# Patient Record
Sex: Female | Born: 1945 | Race: White | Hispanic: No | Marital: Married | Smoking: Never smoker
Health system: Southern US, Community
[De-identification: ages and names within clinical notes are randomized; demographics above are authoritative.]

## PROBLEM LIST (undated history)

## (undated) DIAGNOSIS — Z98811 Dental restoration status: Secondary | ICD-10-CM

## (undated) DIAGNOSIS — I10 Essential (primary) hypertension: Secondary | ICD-10-CM

## (undated) DIAGNOSIS — R42 Dizziness and giddiness: Secondary | ICD-10-CM

## (undated) DIAGNOSIS — H409 Unspecified glaucoma: Secondary | ICD-10-CM

## (undated) DIAGNOSIS — J302 Other seasonal allergic rhinitis: Secondary | ICD-10-CM

## (undated) DIAGNOSIS — R002 Palpitations: Secondary | ICD-10-CM

## (undated) DIAGNOSIS — M65841 Other synovitis and tenosynovitis, right hand: Secondary | ICD-10-CM

## (undated) DIAGNOSIS — Z972 Presence of dental prosthetic device (complete) (partial): Secondary | ICD-10-CM

## (undated) DIAGNOSIS — G43909 Migraine, unspecified, not intractable, without status migrainosus: Secondary | ICD-10-CM

## (undated) HISTORY — DX: Dizziness and giddiness: R42

## (undated) HISTORY — PX: TUBAL LIGATION: SHX77

## (undated) HISTORY — DX: Hypercalcemia: E83.52

## (undated) HISTORY — PX: BREAST BIOPSY: SHX20

## (undated) HISTORY — DX: Palpitations: R00.2

## (undated) HISTORY — PX: CATARACT EXTRACTION W/ INTRAOCULAR LENS IMPLANT: SHX1309

## (undated) HISTORY — DX: Migraine, unspecified, not intractable, without status migrainosus: G43.909

---

## 2001-02-04 ENCOUNTER — Ambulatory Visit (HOSPITAL_COMMUNITY): Admission: RE | Admit: 2001-02-04 | Discharge: 2001-02-04 | Payer: Self-pay | Admitting: Family Medicine

## 2001-02-04 ENCOUNTER — Encounter: Payer: Self-pay | Admitting: Family Medicine

## 2001-04-19 ENCOUNTER — Other Ambulatory Visit: Admission: RE | Admit: 2001-04-19 | Discharge: 2001-04-19 | Payer: Self-pay | Admitting: Family Medicine

## 2001-10-21 ENCOUNTER — Other Ambulatory Visit: Admission: RE | Admit: 2001-10-21 | Discharge: 2001-10-21 | Payer: Self-pay | Admitting: Orthopaedic Surgery

## 2002-02-16 ENCOUNTER — Encounter: Payer: Self-pay | Admitting: Family Medicine

## 2002-02-16 ENCOUNTER — Ambulatory Visit (HOSPITAL_COMMUNITY): Admission: RE | Admit: 2002-02-16 | Discharge: 2002-02-16 | Payer: Self-pay | Admitting: Family Medicine

## 2002-09-29 ENCOUNTER — Other Ambulatory Visit: Admission: RE | Admit: 2002-09-29 | Discharge: 2002-09-29 | Payer: Self-pay | Admitting: Obstetrics & Gynecology

## 2003-03-16 ENCOUNTER — Encounter: Payer: Self-pay | Admitting: Family Medicine

## 2003-03-16 ENCOUNTER — Ambulatory Visit (HOSPITAL_COMMUNITY): Admission: RE | Admit: 2003-03-16 | Discharge: 2003-03-16 | Payer: Self-pay | Admitting: Family Medicine

## 2003-11-21 ENCOUNTER — Other Ambulatory Visit: Admission: RE | Admit: 2003-11-21 | Discharge: 2003-11-21 | Payer: Self-pay | Admitting: Obstetrics & Gynecology

## 2004-04-29 ENCOUNTER — Ambulatory Visit (HOSPITAL_COMMUNITY): Admission: RE | Admit: 2004-04-29 | Discharge: 2004-04-29 | Payer: Self-pay | Admitting: Family Medicine

## 2004-11-28 ENCOUNTER — Other Ambulatory Visit: Admission: RE | Admit: 2004-11-28 | Discharge: 2004-11-28 | Payer: Self-pay | Admitting: Obstetrics and Gynecology

## 2005-05-11 ENCOUNTER — Ambulatory Visit (HOSPITAL_COMMUNITY): Admission: RE | Admit: 2005-05-11 | Discharge: 2005-05-11 | Payer: Self-pay | Admitting: Family Medicine

## 2005-12-04 ENCOUNTER — Other Ambulatory Visit: Admission: RE | Admit: 2005-12-04 | Discharge: 2005-12-04 | Payer: Self-pay | Admitting: Obstetrics & Gynecology

## 2006-06-04 ENCOUNTER — Encounter: Admission: RE | Admit: 2006-06-04 | Discharge: 2006-06-04 | Payer: Self-pay | Admitting: Family Medicine

## 2006-10-22 ENCOUNTER — Ambulatory Visit (HOSPITAL_COMMUNITY): Admission: RE | Admit: 2006-10-22 | Discharge: 2006-10-22 | Payer: Self-pay | Admitting: Family Medicine

## 2006-11-19 ENCOUNTER — Ambulatory Visit (HOSPITAL_COMMUNITY): Admission: RE | Admit: 2006-11-19 | Discharge: 2006-11-19 | Payer: Self-pay | Admitting: Family Medicine

## 2007-12-15 ENCOUNTER — Encounter: Admission: RE | Admit: 2007-12-15 | Discharge: 2007-12-15 | Payer: Self-pay | Admitting: Family Medicine

## 2007-12-22 ENCOUNTER — Encounter: Admission: RE | Admit: 2007-12-22 | Discharge: 2007-12-22 | Payer: Self-pay | Admitting: Family Medicine

## 2009-01-10 ENCOUNTER — Encounter: Admission: RE | Admit: 2009-01-10 | Discharge: 2009-01-10 | Payer: Self-pay | Admitting: Family Medicine

## 2010-02-07 ENCOUNTER — Encounter
Admission: RE | Admit: 2010-02-07 | Discharge: 2010-02-07 | Payer: Self-pay | Source: Home / Self Care | Admitting: Family Medicine

## 2010-10-23 ENCOUNTER — Ambulatory Visit: Admit: 2010-10-23 | Payer: Self-pay | Admitting: Internal Medicine

## 2010-10-23 ENCOUNTER — Ambulatory Visit (HOSPITAL_COMMUNITY)
Admission: RE | Admit: 2010-10-23 | Discharge: 2010-10-23 | Payer: Self-pay | Source: Home / Self Care | Attending: Internal Medicine | Admitting: Internal Medicine

## 2010-11-02 ENCOUNTER — Encounter: Payer: Self-pay | Admitting: Family Medicine

## 2010-11-10 NOTE — Op Note (Signed)
  NAMEJESSALYNN, Sandra Henry             ACCOUNT NO.:  [id number]  MEDICAL RECORD NO.:  [id number]          PATIENT TYPE:  AMB  LOCATION:  DAY                           FACILITY:  [facility]  PHYSICIAN:  Lionel December, M.D.    DATE OF BIRTH:  June 18, 1946  DATE OF PROCEDURE:  10/23/2010 DATE OF DISCHARGE:                              OPERATIVE REPORT   PROCEDURE:  Colonoscopy.  INDICATION:  [name] is 65 year old Caucasian female who is here for screening exam.  Her last exam was 11 years and was normal.  She has noted some change in her bowel habits.  She said she has had intermittent diarrhea, but no abdominal pain, rectal bleeding. Procedure risks were reviewed with the patient.  Informed consent was obtained.  MEDS FOR CONSCIOUS SEDATION: 1. Demerol 50 mg IV. 2. Versed 3 mg IV.  FINDINGS:  Procedure performed in endoscopy suite.  The patient's vital signs and O2 sat were monitored during the procedure and remained stable.  The patient was placed in left lateral recumbent position and rectal examination performed.  No abnormality noted on external or digital exam.  Pentax videoscope was placed in rectum and advanced under vision into sigmoid colon and beyond.  Preparation was excellent.  Scope was passed into cecum which was identified by ileocecal valve and appendiceal orifice.  Short segment of GI was also examined and was normal.  As the scope was withdrawn, colonic mucosa was carefully examined and was normal throughout.  Random biopsies were taken from mucosa was sigmoid colon looking for collagenous and microscopic colitis.  Rectal mucosa was normal.  Scope was retroflexed to examine anorectal junction and small hemorrhoids noted below the dentate line. Endoscope was then withdrawn.  Withdrawal time was 13 minutes.  The patient tolerated the procedure well.  FINAL DIAGNOSES: 1. Normal terminal ileum. 2. Normal colonoscopy except small external hemorrhoids. 3. Random biopsies  taken from mucosa of sigmoid colon looking for     collagenous/microscopic colitis.  RECOMMENDATIONS: 1. Standard instructions given.  I asked [name] to discontinue Activia. 2. I will be contacting her with results of biopsy and further     recommendations.     Lionel December, M.D.     NR/MEDQ  D:  10/23/2010  T:  10/23/2010  Job:  [id number]  cc:   Mila Homer. Sudie Bailey, M.D. Fax: [phone]  Electronically Signed by Lionel December M.D. on 11/10/2010 07:18:31 AM

## 2011-01-23 ENCOUNTER — Other Ambulatory Visit: Payer: Self-pay | Admitting: Family Medicine

## 2011-01-23 DIAGNOSIS — Z1231 Encounter for screening mammogram for malignant neoplasm of breast: Secondary | ICD-10-CM

## 2011-02-13 ENCOUNTER — Other Ambulatory Visit: Payer: Self-pay | Admitting: Obstetrics & Gynecology

## 2011-02-13 ENCOUNTER — Ambulatory Visit
Admission: RE | Admit: 2011-02-13 | Discharge: 2011-02-13 | Disposition: A | Discharge status: Home / Self Care | Payer: Medicare Other | Source: Ambulatory Visit | Attending: Family Medicine | Admitting: Family Medicine

## 2011-02-13 DIAGNOSIS — Z1231 Encounter for screening mammogram for malignant neoplasm of breast: Secondary | ICD-10-CM

## 2011-08-20 ENCOUNTER — Ambulatory Visit (HOSPITAL_COMMUNITY)
Admission: RE | Admit: 2011-08-20 | Discharge: 2011-08-20 | Disposition: A | Discharge status: Home / Self Care | Payer: Medicare Other | Source: Ambulatory Visit | Attending: Family Medicine | Admitting: Family Medicine

## 2011-08-20 ENCOUNTER — Other Ambulatory Visit (HOSPITAL_COMMUNITY): Payer: Self-pay | Admitting: Family Medicine

## 2011-08-20 DIAGNOSIS — M5030 Other cervical disc degeneration, unspecified cervical region: Secondary | ICD-10-CM | POA: Insufficient documentation

## 2011-08-20 DIAGNOSIS — M542 Cervicalgia: Secondary | ICD-10-CM

## 2011-09-01 ENCOUNTER — Other Ambulatory Visit (HOSPITAL_COMMUNITY): Payer: Self-pay | Admitting: Family Medicine

## 2011-09-01 ENCOUNTER — Ambulatory Visit (HOSPITAL_COMMUNITY)
Admission: RE | Admit: 2011-09-01 | Discharge: 2011-09-01 | Disposition: A | Discharge status: Home / Self Care | Payer: Medicare Other | Source: Ambulatory Visit | Attending: Family Medicine | Admitting: Family Medicine

## 2011-09-01 DIAGNOSIS — M542 Cervicalgia: Secondary | ICD-10-CM

## 2011-09-01 DIAGNOSIS — W19XXXA Unspecified fall, initial encounter: Secondary | ICD-10-CM

## 2011-09-01 DIAGNOSIS — M5030 Other cervical disc degeneration, unspecified cervical region: Secondary | ICD-10-CM | POA: Insufficient documentation

## 2011-09-08 ENCOUNTER — Other Ambulatory Visit (HOSPITAL_COMMUNITY): Payer: Self-pay | Admitting: Neurological Surgery

## 2011-09-08 DIAGNOSIS — M479 Spondylosis, unspecified: Secondary | ICD-10-CM

## 2011-09-10 ENCOUNTER — Ambulatory Visit (HOSPITAL_COMMUNITY)
Admission: RE | Admit: 2011-09-10 | Discharge: 2011-09-10 | Disposition: A | Discharge status: Home / Self Care | Payer: Medicare Other | Source: Ambulatory Visit | Attending: Neurological Surgery | Admitting: Neurological Surgery

## 2011-09-10 DIAGNOSIS — M479 Spondylosis, unspecified: Secondary | ICD-10-CM

## 2011-09-10 DIAGNOSIS — M25519 Pain in unspecified shoulder: Secondary | ICD-10-CM | POA: Insufficient documentation

## 2011-09-10 DIAGNOSIS — M542 Cervicalgia: Secondary | ICD-10-CM | POA: Insufficient documentation

## 2011-11-19 ENCOUNTER — Encounter (HOSPITAL_COMMUNITY): Payer: Self-pay

## 2011-11-19 ENCOUNTER — Ambulatory Visit (HOSPITAL_COMMUNITY)
Admission: RE | Admit: 2011-11-19 | Discharge: 2011-11-19 | Disposition: A | Discharge status: Home / Self Care | Payer: Medicare Other | Source: Ambulatory Visit | Attending: Neurological Surgery | Admitting: Neurological Surgery

## 2011-11-19 DIAGNOSIS — H53439 Sector or arcuate defects, unspecified eye: Secondary | ICD-10-CM | POA: Diagnosis not present

## 2011-11-19 DIAGNOSIS — M542 Cervicalgia: Secondary | ICD-10-CM | POA: Diagnosis not present

## 2011-11-19 DIAGNOSIS — H4011X0 Primary open-angle glaucoma, stage unspecified: Secondary | ICD-10-CM | POA: Diagnosis not present

## 2011-11-19 DIAGNOSIS — M6281 Muscle weakness (generalized): Secondary | ICD-10-CM | POA: Insufficient documentation

## 2011-11-19 DIAGNOSIS — IMO0001 Reserved for inherently not codable concepts without codable children: Secondary | ICD-10-CM | POA: Diagnosis not present

## 2011-11-19 DIAGNOSIS — R293 Abnormal posture: Secondary | ICD-10-CM | POA: Insufficient documentation

## 2011-11-19 DIAGNOSIS — H409 Unspecified glaucoma: Secondary | ICD-10-CM | POA: Diagnosis not present

## 2011-11-19 DIAGNOSIS — H2510 Age-related nuclear cataract, unspecified eye: Secondary | ICD-10-CM | POA: Diagnosis not present

## 2011-11-19 NOTE — Evaluation (Signed)
Physical Therapy Evaluation  Patient Details  Name: Sandra Henry MRN: [id number] Date of Birth: Mar 08, 1946  Today's Date: 11/19/2011 Time: 1345-1430 Time Calculation (min): 45 min Charges: 1 EV, 8 TE Visit#: 1  of 16   Re-eval: 12/19/11  Assessment Diagnosis: neck pain Next MD Visit: none scheduled.  Prior Therapy: none  Subjective Symptoms/Limitations Symptoms: 08/19/11 fell and injured her neck.  Headaches daily at work.   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   Pain Assessment Currently in Pain?: Yes Pain Score:   4 Pain Location: Neck Pain Orientation: Left Pain Type: Acute pain Pain Radiating Towards: into left shoulder Pain Onset: More than a month ago Pain Frequency: Constant Pain Relieving Factors: sitting down in the recliner and leaning back, has tried heat and ice.    Effect of Pain on Daily Activities: doesn't interfere with sleep (sleeps on her back)  Assessment RUE Strength Right Shoulder Flexion: 5/5 Right Shoulder ABduction: 5/5 Right Elbow Flexion: 5/5 Right Elbow Extension: 5/5 Gross Grasp: Functional LUE Strength Left Shoulder Flexion: 5/5 Left Shoulder ABduction: 5/5 Left Elbow Flexion: 5/5 Left Elbow Extension: 5/5 Gross Grasp: Functional Cervical AROM Cervical Flexion: WNL (posterior pulling) Cervical Extension: 27 degrees of cervical extension Cervical - Right Side Bend: 25 degrees (sharp central pain returning to midline) Cervical - Left Side Bend: 42 degrees Cervical - Right Rotation: 58 degrees Cervical - Left Rotation: 48 degrees Cervical Strength Cervical Flexion: 4/5 Cervical Extension: 4/5 Cervical - Right Side Bend: 4/5 Cervical - Left  Side Bend: 4-/5 Cervical - Right Rotation: 4-/5 Cervical - Left Rotation: 4/5  Exercise/Treatments Stretches Upper Trapezius Stretch: 2 reps;20 seconds;Limitations Upper Trapezius Stretch Limitations: bil Neck Exercises Neck Retraction: Seated;Limitations Neck Retraction Limitations: 8 reps Scapular Retraction: AROM;Seated;10 reps  Physical Therapy Assessment and Plan Clinical Impression Statement: This 66 y.o. female presents to [facility] OP PT for neck pain that started and never resolved after a fall 08/19/11.  She presents with decreased cervical spine ROM with cervical kyphosis instead of the normal cervical lordosis.  She has forward head and rounded shoulders and significant decrease in cervical extension and rotation.  She has very tight upper traps bil and levator scapulae bil.  She has occational radicular symptoms into her shoulders.  She would benefit from skilled PT to help strengthen and mobilize her cervical spine and help decrase her muscle tension in her neck while providing her with postural exercises and education.   Rehab Potential: Good PT Frequency: Min 2X/week PT Duration: 8 weeks PT Treatment/Interventions: Functional mobility training;Therapeutic activities;Therapeutic exercise;Balance training;Neuromuscular re-education;Patient/family education;Other (comment) (modalities as needed for pain, STM for muscle tension) PT Plan: Continue 2 xs/wk x 8 weeks, review HEP, add UBE backwards only for warm-up, add red t-band exercises, corner stretch, prone chin tucks, isometric extension and sidebend bil.      Goals Home Exercise Program Pt will Perform Home Exercise Program: Independently PT Short Term Goals Time to Complete Short Term Goals: 4 weeks PT Short Term Goal 1: The patient will report that she can make it 6 hours at work before her neck starts to bother her increasing her QOL and tolerance of work activities.   PT Short Term Goal 2: The patient will report an average  daily pain of less than 3/10 to show improved QOL and increased tolerance of functional activities.   PT Short Term Goal 3: The patinet will increase her cervical ROM by 5 degrees throughout to show improved cervical ROM and increased ability to check her blind spot when driving.   PT Long Term Goals Time to Complete Long Term Goals: 8 weeks PT Long Term Goal 1: The patient will report that she is able to make it a whole day at work with minimal neck discomfort.   PT Long Term Goal 2: The patietn will report less than 2/10 neck pain on average daily to show improved QOL and tolerance of functional activities.   Long Term Goal 3: The patient will increase cervical ROM by 10 degrees in areas of deficits to show improved cervical spine mobility.   Long Term Goal 4: The pateint will increase cervical strength to 5/5 to show improved cervical spine stability.   Problem List Patient Active Problem List  Diagnoses  . Cervicalgia  . Posture abnormality  . Muscle weakness (generalized)   PT - End of Session Activity Tolerance: Patient tolerated treatment well General Behavior During Session: [facility] for tasks performed Cognition: [facility] for tasks performed PT Plan of Care PT Home Exercise Plan: see scanned report, chin tucks, scap retractions, and UT stretch bil.   Consulted and Agree with Plan of Care: Patient  Analisse Randle. Jaymari Cromie, PT, DPT  11/19/2011, 8:07 PM  Physician Documentation Your signature is required to indicate approval of the treatment plan as stated above.  Please sign and either send electronically or make a copy of this report for your files and return this physician signed original.   Please mark one 1.__approve of plan  2. ___approve of plan with the following conditions.   ______________________________                                                          _____________________ Physician Signature                                                                                                              Date

## 2011-11-26 ENCOUNTER — Ambulatory Visit (HOSPITAL_COMMUNITY)
Admission: RE | Admit: 2011-11-26 | Discharge: 2011-11-26 | Disposition: A | Discharge status: Home / Self Care | Payer: Medicare Other | Source: Ambulatory Visit | Attending: Family Medicine | Admitting: Family Medicine

## 2011-11-26 ENCOUNTER — Ambulatory Visit (HOSPITAL_COMMUNITY): Payer: Medicare Other

## 2011-11-26 NOTE — Progress Notes (Signed)
Physical Therapy Treatment Patient Details  Name: Sandra Henry MRN: [id number] Date of Birth: April 11, 1946  Today's Date: 11/26/2011 Time: 0802-0854 Time Calculation (min): 52 min Visit#: 2  of 16   Re-eval: 12/18/11 Charges:  therex 35', massage 12'    Subjective: Symptoms/Limitations Symptoms: Pt. reports compliance with HEP; Pt. reports no real pain, just alot of stiffness and inability to rotate head.  Pt. displays very guarded posture with next tilted to the left today. Pain Assessment Currently in Pain?: No/denies   Exercise/Treatments Stretches Upper Trapezius Stretch: 2 reps;20 seconds;Limitations Upper Trapezius Stretch Limitations: HEP Corner Stretch: 3 reps;30 seconds Neck Exercises Neck Retraction: 10 reps Neck Retraction Limitations: HEP Neck Extension: 10 reps;Limitations Neck Extension Limitations: isometric against wall Neck Lateral Flexion - Right: 10 reps;Limitations Neck Lateral Flexion - Right Limitations: isometric Neck Lateral Flexion - Left: 10 reps;Limitations Neck Lateral Flexion - Left Limitations: isometric TBAND POSTURAL 3  with RED tband 10 reps each Additional Neck Exercises UBE (Upper Arm Bike): 4' backward  Manual Therapy Manual Therapy: Massage Massage:  B upper traps and cervical mm in seated postion X 12'  Physical Therapy Assessment and Plan PT Assessment and Plan Clinical Impression Statement: Pt. with large knots bilateral cervical but most in L trap/cervical area that was reduced with massage but not diminished.  Added tband, corner stretch and isometric exercises with good form .  PT Plan: Continue to progress strength and increase AROM in cervical area.  Add prone chin tucks     Problem List Patient Active Problem List  Diagnoses  . Cervicalgia  . Posture abnormality  . Muscle weakness (generalized)    PT - End of Session Activity Tolerance: Patient tolerated treatment well General Behavior During Session: [facility] for  tasks performed Cognition: [facility] for tasks performed PT Plan of Care PT Home Exercise Plan: Pt. given written HEP for corner stretch and isometrics today.  Emaleigh Guimond B. Bascom Levels, PTA 11/26/2011, 11:03 AM

## 2011-11-27 ENCOUNTER — Ambulatory Visit (HOSPITAL_COMMUNITY)
Admission: RE | Admit: 2011-11-27 | Discharge: 2011-11-27 | Disposition: A | Discharge status: Home / Self Care | Payer: Medicare Other | Source: Ambulatory Visit

## 2011-11-27 DIAGNOSIS — M542 Cervicalgia: Secondary | ICD-10-CM

## 2011-11-27 DIAGNOSIS — R293 Abnormal posture: Secondary | ICD-10-CM

## 2011-11-27 DIAGNOSIS — M6281 Muscle weakness (generalized): Secondary | ICD-10-CM

## 2011-11-27 NOTE — Progress Notes (Signed)
Physical Therapy Treatment Patient Details  Name: Sandra Henry MRN: [id number] Date of Birth: 10-Apr-1946  Today's Date: 11/27/2011 Time: 4098-1191 Time Calculation (min): 56 min Visit#: 3  of 16   Re-eval: 12/18/11  Charge: therex 40 min Massage 12 min  Subjective: Symptoms/Limitations Symptoms: Pt reports interrment sharp shooting pain L neck, pain scale 2-3/10. Pain Assessment Currently in Pain?: Yes Pain Score:   3 Pain Location: Neck Pain Orientation: Left  Objective:   Exercise/Treatments Stretches Upper Trapezius Stretch: 3 reps;30 seconds;Limitations Upper Trapezius Stretch Limitations: good form with stretch Neck Exercises Neck Retraction: 10 reps;Limitations Neck Retraction Limitations: prone chin tuck head lift Neck Extension: 10 reps;Seated Neck Lateral Flexion - Right: 10 reps;Limitations;AROM Neck Lateral Flexion - Right Limitations: isometric Neck Lateral Flexion - Left: 10 reps;Limitations;AROM Neck Lateral Flexion - Left Limitations: isometric Shoulder Extension: 10 reps;Standing;Theraband Theraband Level (Shoulder Extension): Level 2 (Red) Row: 10 reps;Standing;Theraband Theraband Level (Row): Level 2 (Red) Scapular Retraction: 10 reps;Standing;Theraband Theraband Level (Scapular Retraction): Level 2 (Red) Additional Neck Exercises UBE (Upper Arm Bike): 4' backward  Manual Therapy Manual Therapy: Massage Massage: B upper traps and cervical mm in seated postion X 12'   Physical Therapy Assessment and Plan PT Assessment and Plan Clinical Impression Statement: Began prone chin tuck/head lift with good form/technique complete following demonstration and explaination of purpose of exercise.  Pt with large knots L upper tap and cervical area able to reduce but not fully resolve, pt stated pain reduced at end of session. PT Plan: Continue to progress strength and increase AROM in cervical area.  Resume corner stretch next session.    Goals     Problem List Patient Active Problem List  Diagnoses  . Cervicalgia  . Posture abnormality  . Muscle weakness (generalized)    PT - End of Session Activity Tolerance: Patient tolerated treatment well General Behavior During Session: [facility] for tasks performed Cognition: [facility] for tasks performed  Juel Burrow 11/27/2011, 2:22 PM

## 2011-12-03 ENCOUNTER — Ambulatory Visit (HOSPITAL_COMMUNITY)
Admission: RE | Admit: 2011-12-03 | Discharge: 2011-12-03 | Disposition: A | Discharge status: Home / Self Care | Payer: Medicare Other | Source: Ambulatory Visit | Attending: Family Medicine | Admitting: Family Medicine

## 2011-12-03 NOTE — Progress Notes (Signed)
Physical Therapy Treatment Patient Details  Name: Sandra Henry MRN: [id number] Date of Birth: 1946-05-24  Today's Date: 12/03/2011 Time: 0802-0851 Time Calculation (min): 49 min Visit#: 4  of 16   Re-eval: 12/18/11 Charges:  Therex 32', massage 12'    Subjective: Symptoms/Limitations Symptoms: Pt. reports her choir family can tell her posture has improved; states they are adjusting her computer at work.  More soreness today than pain. Pain Assessment Currently in Pain?: No/denies Pain Score: 0-No pain   Exercise/Treatments Stretches Corner Stretch: 3 reps;30 seconds Neck Exercises Neck Extension: 10 reps;Seated Neck Lateral Flexion - Right: 10 reps;Limitations;AROM Neck Lateral Flexion - Right Limitations: isometric Neck Lateral Flexion - Left: 10 reps;Limitations;AROM Neck Lateral Flexion - Left Limitations: isometric Shoulder Extension: 10 reps Theraband Level (Shoulder Extension): Level 3 (Green) Row: 10 reps Theraband Level (Row): Level 3 (Green) Scapular Retraction: 10 reps Theraband Level (Scapular Retraction): Level 3 (Green) Additional Neck Exercises UBE (Upper Arm Bike): 4' backward  Manual Therapy Manual Therapy: Massage Massage: B upper traps/cervical mm in seated position X 12'  Physical Therapy Assessment and Plan PT Assessment and Plan Clinical Impression Statement: Overall improving posture and chest mm tightness.  Pt with decreased palpable mm spasms/tightness in cervical/scapular area, however continues to have large mm spasms B upper traps that decrease with massage but not totally resolved.  pt. given tband/written instructions for HEP PT Plan: Continue current POC.     Problem List Patient Active Problem List  Diagnoses  . Cervicalgia  . Posture abnormality  . Muscle weakness (generalized)    PT - End of Session Activity Tolerance: Patient tolerated treatment well General Behavior During Session: [facility] for tasks performed Cognition:  [facility] for tasks performed PT Plan of Care PT Home Exercise Plan: Pt. given written instructions/tband today for HEP  GP No functional reporting required  Loveta Dellis B. Bascom Levels, PTA 12/03/2011, 10:45 AM

## 2011-12-04 ENCOUNTER — Ambulatory Visit (HOSPITAL_COMMUNITY)
Admission: RE | Admit: 2011-12-04 | Discharge: 2011-12-04 | Disposition: A | Discharge status: Home / Self Care | Payer: Medicare Other | Source: Ambulatory Visit | Attending: Family Medicine | Admitting: Family Medicine

## 2011-12-04 DIAGNOSIS — M6281 Muscle weakness (generalized): Secondary | ICD-10-CM

## 2011-12-04 DIAGNOSIS — M542 Cervicalgia: Secondary | ICD-10-CM

## 2011-12-04 DIAGNOSIS — R293 Abnormal posture: Secondary | ICD-10-CM

## 2011-12-04 NOTE — Progress Notes (Signed)
Physical Therapy Treatment Patient Details  Name: ROSALEAH PERSON MRN: [id number] Date of Birth: 1946-05-08  Today's Date: 12/04/2011 Time: 1300-1350 Time Calculation (min): 50 min Charges: manual 35', TE: 15' Visit#: 5  of 16   Re-eval: 12/18/11    Subjective: Symptoms/Limitations Symptoms: Pt reports that she is very stiff today.  She reports she is doing better with her range of motion, however continues to have stiffness.  OBJ: Pt comes in and is very timid to move her neck and has significant forward head posture.  Pain Assessment Currently in Pain?: Yes Pain Score:   7 Pain Location: Neck  Exercise/Treatments Neck Exercises Neck Retraction: 5 reps;Supine;Limitations Neck Retraction Limitations: 5 sec hold with chin lift and min A Neck Lateral Flexion - Right: 10 reps;Supine Neck Lateral Flexion - Left: 10 reps;Supine Neck Rotation - Right: 10 reps;Supine Neck Rotation - Left: 10 reps;Supine Upper Extremity Lift: Limitations Uppder Extremity Lift Limitations: Motor Learning II (neck rotation with oppisiste UE rotation.  Additional Neck Exercises UBE (Upper Arm Bike): 4' backward  Manual Therapy Manual Therapy: Other (comment) Massage: Manual therapy with ROM techniques in Supine concentrated on R UT and scapular and cervical spasms.  SCS to decrease muscular spasm to R UT. x35'  Other Manual Therapy: Instruction for diaphragmatic breathing and shoulder, neck and facial awareness in supine.   Physical Therapy Assessment and Plan PT Assessment and Plan Clinical Impression Statement: Today's treatment focus on appropriate technique in supine to decrease amount of spams and promote appropriate cervical activiation.  Pt with considerable decrease in neck pain after treatment completed in supine today.  Treatment:1. UBE, 2.Manual in Supine, 3. Instruction on Diaphragmatic breathing and self awarness 4. Ther-ex.  Pt had improved posture after today's treatment with decreased  UT activiation.  PT Plan: Cont to address pain, address pain after today's treatment.  Continue to discuss relaxation techniques and strengthen when spams are decreased.     Goals    Problem List Patient Active Problem List  Diagnoses  . Cervicalgia  . Posture abnormality  . Muscle weakness (generalized)    PT Plan of Care PT Home Exercise Plan: updated.  See scanned report  Teddy Pena 12/04/2011, 3:41 PM

## 2011-12-07 ENCOUNTER — Ambulatory Visit (HOSPITAL_COMMUNITY)
Admission: RE | Admit: 2011-12-07 | Discharge: 2011-12-07 | Disposition: A | Discharge status: Home / Self Care | Payer: Medicare Other | Source: Ambulatory Visit | Attending: Physical Therapy | Admitting: Physical Therapy

## 2011-12-07 DIAGNOSIS — R293 Abnormal posture: Secondary | ICD-10-CM

## 2011-12-07 DIAGNOSIS — M6281 Muscle weakness (generalized): Secondary | ICD-10-CM

## 2011-12-07 DIAGNOSIS — M542 Cervicalgia: Secondary | ICD-10-CM

## 2011-12-07 NOTE — Progress Notes (Signed)
Physical Therapy Treatment Patient Details  Name: Sandra Henry MRN: [id number] Date of Birth: 10/07/1946  Today's Date: 12/07/2011 Time: 5621-3086 Time Calculation (min): 44 min Charges: 36' TE, 8' Man Visit#: 6  of 16   Re-eval: 12/18/11    Subjective: Symptoms/Limitations Symptoms: Pt reports that she is doing okay if she could just get rid of the spasms in her neck.  She had some relief after last visit. Pain Assessment Currently in Pain?: Yes Pain Score:   3 Pain Location: Neck  Exercise/Treatments Neck Exercises Neck Retraction: 10 reps Neck Retraction Limitations: 5 sec hold w/head lift in supine Neck Flexion: 10 reps;Supine;Limitations Neck Flexion Limitations: 5 sec Neck Extension: 10 reps;Seated Neck Rotation - Right: Limitations;20 reps;Seated;Standing Neck Rotation - Right Limitations: Tactile cueing for approrpriate muscle activiation Neck Rotation - Left: 20 reps;Seated;Standing;Limitations Neck Rotation - Left Limitations: Tactile cueing for appropriate muscle activiation Uppder Extremity Lift Limitations: Motor Learning III (with opp elbow bend) x10 each direction, Upper Extremity Lift with towel roll behind head and alternating arms to 90 degrees flexion x10 each arm. Additional Neck Exercises UBE (Upper Arm Bike): 4' backward  Manual Therapy Manual Therapy: Other (comment) Other Manual Therapy: STM to Bil UT region to decreases mild spams throughout. x8 minutes before ther-ex.   Physical Therapy Assessment and Plan PT Assessment and Plan Clinical Impression Statement: Pt has significant improvement in cervical strength and is able to demonstrate supine ther-ex without assistance.  She is able to maintain appropriate posture with decreased shoulder elevation.  Had decreased muscle soreness and spasm after tactile cueing for appropriate muscule activiation.  PT Plan: Cont to progress cervical stability and mobility with gentle techniques.  Decrease spasm,  then proceed with mobility.      Problem List Patient Active Problem List  Diagnoses  . Cervicalgia  . Posture abnormality  . Muscle weakness (generalized)    Lelynd Poer 12/07/2011, 5:49 PM

## 2011-12-10 ENCOUNTER — Ambulatory Visit (HOSPITAL_COMMUNITY)
Admission: RE | Admit: 2011-12-10 | Discharge: 2011-12-10 | Disposition: A | Discharge status: Home / Self Care | Payer: Medicare Other | Source: Ambulatory Visit | Attending: Family Medicine | Admitting: Family Medicine

## 2011-12-10 DIAGNOSIS — R293 Abnormal posture: Secondary | ICD-10-CM

## 2011-12-10 DIAGNOSIS — M542 Cervicalgia: Secondary | ICD-10-CM

## 2011-12-10 DIAGNOSIS — M6281 Muscle weakness (generalized): Secondary | ICD-10-CM

## 2011-12-10 NOTE — Progress Notes (Signed)
Physical Therapy Treatment Patient Details  Name: Sandra Henry MRN: [id number] Date of Birth: [birth year]  Today's Date: 12/10/2011 Time: 4098-1191 Time Calculation (min): 46 min Visit#: 7  of 16   Re-eval: 12/18/11  Charge: therex 34 min STM 8 min  Subjective: Symptoms/Limitations Symptoms: Feeling good today, last night was not good feel those spasms in the back of neck wont go away.  Do feel like my ROM is improving. Pain Assessment Currently in Pain?: No/denies  Objective:   Exercise/Treatments Neck Exercises Neck Retraction: 10 reps;Supine;Seated Neck Retraction Limitations: 5 sec hold w/head lift in supine Neck Extension: 10 reps;Seated Neck Rotation - Right: 10 reps;Seated;Limitations Neck Rotation - Right Limitations: AAROM with towel for rotation Neck Rotation - Left: 10 reps;Seated;Limitations Neck Rotation - Left Limitations: AAROM with towel for rotation Uppder Extremity Lift Limitations: Motor Learning III (with opp elbow bend) x10 each direction, Upper Extremity Lift with towel roll behind head and alternating arms to 90 degrees flexion x10 each arm. Additional Neck Exercises UBE (Upper Arm Bike): 4' backward  Manual Therapy Other Manual Therapy: STM to Bil UT region to decreases mild spams  Physical Therapy Assessment and Plan PT Assessment and Plan Clinical Impression Statement: Pt with good posture and form with therex exercises, min cueing for technique required through session.  Instructed AA for rotation with towel, pt able to demonstrate with increased rotation and no c/o pain.  Tactile cueing beneficial for UT muscular activation with increased ROM noted..  Able to reduce spasms B UT though unable to fully resolve. PT Plan: Cont to progress cervical stability and mobility with gentle techniques. Decrease spasm, then proceed with mobility.    Goals    Problem List Patient Active Problem List  Diagnoses  . Cervicalgia  . Posture abnormality  .  Muscle weakness (generalized)    PT - End of Session Activity Tolerance: Patient tolerated treatment well General Behavior During Session: [facility] for tasks performed Cognition: [facility] for tasks performed  Juel Burrow, PTA 12/10/2011, 8:55 AM

## 2011-12-15 ENCOUNTER — Ambulatory Visit (HOSPITAL_COMMUNITY)
Admission: RE | Admit: 2011-12-15 | Discharge: 2011-12-15 | Disposition: A | Discharge status: Home / Self Care | Payer: Medicare Other | Source: Ambulatory Visit | Attending: Neurological Surgery | Admitting: Neurological Surgery

## 2011-12-15 DIAGNOSIS — M6281 Muscle weakness (generalized): Secondary | ICD-10-CM | POA: Insufficient documentation

## 2011-12-15 DIAGNOSIS — R293 Abnormal posture: Secondary | ICD-10-CM

## 2011-12-15 DIAGNOSIS — IMO0001 Reserved for inherently not codable concepts without codable children: Secondary | ICD-10-CM | POA: Insufficient documentation

## 2011-12-15 DIAGNOSIS — M542 Cervicalgia: Secondary | ICD-10-CM | POA: Insufficient documentation

## 2011-12-15 NOTE — Progress Notes (Signed)
Physical Therapy Treatment Patient Details  Name: Sandra Henry MRN: [id number] Date of Birth: [birth year]  Today's Date: 12/15/2011 Time: 1720-1810 Time Calculation (min): 50 min Visit#: 8  of 16   Re-eval: 12/18/11  Charge: STM: 18 min therex 28 min  Subjective: Symptoms/Limitations Symptoms: Pt entered dept stating increased pain (6-7/10)/spasms back of neck at work today.  No pain through session just soreness.  Pt stated most difficulty at home is bending over to get clothes out of washer/dryer, getting dishes out of washer and bending down to get objects out of cabinets on floor. Pain Assessment Currently in Pain?: No/denies  Objective:   Exercise/Treatments Neck Exercises Neck Retraction: Supine;10 reps;Limitations Neck Retraction Limitations: 2 sets/ 10 reps5 sec hold w/head lift in supine Neck Extension: 10 reps;Seated;Limitations Neck Extension Limitations: with towel at base of skull to assist with ext Neck Rotation - Right: 10 reps;Seated;Limitations Neck Rotation - Right Limitations: AAROM with towel for rotation Neck Rotation - Left: 10 reps;Seated;Limitations Neck Rotation - Left Limitations: AAROM with towel for rotation Upper Extremity Lift: Limitations Uppder Extremity Lift Limitations: Motor Learning III (with opp elbow bend) x10 each direction, Upper Extremity Lift with towel roll behind head and alternating arms to 90 degrees flexion x10 each arm. Additional Neck Exercises UBE (Upper Arm Bike): 4' backward  Manual Therapy Manual Therapy: Massage Massage: Bilateral UT to reduce spasms x 18 min  Physical Therapy Assessment and Plan PT Assessment and Plan Clinical Impression Statement: Began session with STM due to increased spasms/pain earlier today at work.  Multiple small spasms B UT, able to reduce with relief stated by pt but unable to fully resolve.  Instructed AA with towel, pt able to demonstrate with increase cervical extension ROM noted..   PT  Plan: Re-eval next session.    Goals    Problem List Patient Active Problem List  Diagnoses  . Cervicalgia  . Posture abnormality  . Muscle weakness (generalized)    PT - End of Session Activity Tolerance: Patient tolerated treatment well General Behavior During Session: [facility] for tasks performed Cognition: [facility] for tasks performed  Juel Burrow, PTA 12/15/2011, 6:11 PM

## 2011-12-17 ENCOUNTER — Ambulatory Visit (HOSPITAL_COMMUNITY)
Admission: RE | Admit: 2011-12-17 | Discharge: 2011-12-17 | Disposition: A | Discharge status: Home / Self Care | Payer: Medicare Other | Source: Ambulatory Visit | Attending: Family Medicine | Admitting: Family Medicine

## 2011-12-17 DIAGNOSIS — M6281 Muscle weakness (generalized): Secondary | ICD-10-CM

## 2011-12-17 DIAGNOSIS — R293 Abnormal posture: Secondary | ICD-10-CM

## 2011-12-17 DIAGNOSIS — M542 Cervicalgia: Secondary | ICD-10-CM | POA: Diagnosis not present

## 2011-12-17 DIAGNOSIS — IMO0001 Reserved for inherently not codable concepts without codable children: Secondary | ICD-10-CM | POA: Diagnosis not present

## 2011-12-17 NOTE — Evaluation (Addendum)
Physical Therapy Evaluation  Patient Details  Name: Sandra Henry MRN: [id number] Date of Birth: 09-25-1946  Today's Date: 12/17/2011 Time: 8295-6213 Time Calculation (min): 44 min Charges: 28' Self Care, 1 estim, 1 heat Visit#: 9  of 16   Re-eval: 01/16/12    Past Medical History: No past medical history on file. Past Surgical History: No past surgical history on file.  Subjective Symptoms/Limitations Symptoms: Pt reports that she is making it through the morning with some decrease in pain.  Unable to make it 6 hours through the day without pain.  She feels that she has made progress especially in her ROM and her stretching.  She has less pain overall with exercise.  Overall her pain is about 4-5/10,  however the last two days her pain has been intense to the point that she has had to stop in choir practice. How long can you sit comfortably?: 4 hours  How long can you stand comfortably?: getting groceries is still somewhat untolerable, but it has become a little easier. Pain Assessment Currently in Pain?: Yes Pain Score:   3 Pain Location: Neck  Assessment Cervical AROM Cervical Extension: 30 Cervical - Right Side Bend: 27 Cervical - Left Side Bend: 25 Cervical - Right Rotation: 70 Cervical - Left Rotation: 52 Cervical Strength Cervical Flexion: 5/5 Cervical Extension: 5/5 Cervical - Right Side Bend: 5/5 Cervical - Left Side Bend: 5/5 Cervical - Right Rotation: 5/5 Cervical - Left Rotation: 4/5 Palpation Palpation: Continues to have significant increase in muscle spasms to her posterior cervical spine  Exercise/Treatments  Modalities Modalities: Moist Heat;Electrical Stimulation Moist Heat Therapy Number Minutes Moist Heat: 15 Minutes Moist Heat Location: Shoulder;Other (comment) (Neck) Electrical Stimulation Electrical Stimulation Location: IFES to middle cervical and shoulder region Electrical Stimulation Action: IFES Electrical Stimulation Parameters: 15  minutes  Electrical Stimulation Goals: Pain  Physical Therapy Assessment and Plan PT Assessment and Plan Clinical Impression Statement: Discussed goals and completed Re-eval completed today.  Pt had a decrease in pain after e-stim and heat today.    Sandra Henry has attended 8 OPPT visits and has met 1/4 STG and 0/4 LTG, however is making significant progress towards her STG.  She has improved percieved functional ability, overall decrease in pain and improved cervical and capital flexion strength.  However continues to have impairments including increased stiffness, fascial tightness, muscle spasms and decreased cervical ROM.  She will continue to benefit from skilled OPPT in order to address the above impairments in order to maximize function and reach functional goals.  PT Frequency: Min 2X/week PT Duration: 4 weeks PT Plan: F/U w/estim and heat.  Continue with manual therapy to decrease spasm and exercises to improve cervical stability and flexibility    Goals Home Exercise Program Pt will Perform Home Exercise Program: Independently PT Goal: Perform Home Exercise Program - Progress: Met PT Short Term Goals Time to Complete Short Term Goals: 4 weeks PT Short Term Goal 1: The patient will report that she can make it 6 hours at work before her neck starts to bother her increasing her QOL and tolerance of work activities.   PT Short Term Goal 1 - Progress: Progressing toward goal PT Short Term Goal 2: The patient will report an average daily pain of less than 3/10 to show improved QOL and increased tolerance of functional activities.   PT Short Term Goal 2 - Progress: Progressing toward goal PT Short Term Goal 3: The patinet will increase her cervical ROM by 5 degrees throughout  to show improved cervical ROM and increased ability to check her blind spot when driving.   PT Long Term Goals Time to Complete Long Term Goals: 8 weeks PT Long Term Goal 1: The patient will report that she is able to  make it a whole day at work with minimal neck discomfort.   PT Long Term Goal 1 - Progress: Not met PT Long Term Goal 2: The patietn will report less than 2/10 neck pain on average daily to show improved QOL and tolerance of functional activities.   PT Long Term Goal 2 - Progress: Not met Long Term Goal 3: The patient will increase cervical ROM by 10 degrees in areas of deficits to show improved cervical spine mobility.   Long Term Goal 3 Progress: Not met Long Term Goal 4: The pateint will increase cervical strength to 5/5 to show improved cervical spine stability.  Long Term Goal 4 Progress: Not met  Problem List Patient Active Problem List  Diagnoses  . Cervicalgia  . Posture abnormality  . Muscle weakness (generalized)    PT - End of Session Activity Tolerance: Patient tolerated treatment well  GP  Functional Reporting Modifier  Current Status  [phone] - Changing & Maintaing Body Position CK - At least 40% but less than 60% impaired, limited or restricted  Goal Status  [phone] - Changing & Maintaing Body Position CI - At least 1% but less than 20% impaired, limited or restricted    Based on reports of only being able to sit for 4 hours at a time at work and continues to have difficulty with shopping, doing her hair and pain with eating. Sandra Henry 12/17/2011, 9:52 AM  Physician Documentation Your signature is required to indicate approval of the treatment plan as stated above.  Please sign and either send electronically or make a copy of this report for your files and return this physician signed original.   Please mark one 1.__approve of plan  2. ___approve of plan with the following conditions.   ______________________________                                                          _____________________ Physician Signature                                                                                                             Date

## 2011-12-24 ENCOUNTER — Ambulatory Visit (HOSPITAL_COMMUNITY)
Admission: RE | Admit: 2011-12-24 | Discharge: 2011-12-24 | Disposition: A | Discharge status: Home / Self Care | Payer: Medicare Other | Source: Ambulatory Visit

## 2011-12-24 DIAGNOSIS — M6281 Muscle weakness (generalized): Secondary | ICD-10-CM

## 2011-12-24 DIAGNOSIS — IMO0001 Reserved for inherently not codable concepts without codable children: Secondary | ICD-10-CM | POA: Diagnosis not present

## 2011-12-24 DIAGNOSIS — M542 Cervicalgia: Secondary | ICD-10-CM | POA: Diagnosis not present

## 2011-12-24 DIAGNOSIS — R293 Abnormal posture: Secondary | ICD-10-CM

## 2011-12-24 NOTE — Progress Notes (Signed)
Physical Therapy Treatment Patient Details  Name: Sandra Henry MRN: [id number] Date of Birth: [birth year]  Today's Date: 12/24/2011 Time: 1036-1120 Time Calculation (min): 44 min Charges: NMR x 34', Manual 10'  Visit#: 10  of 16   Re-eval: 01/16/12    Subjective: Symptoms/Limitations Symptoms: Pt states she was doing pretty well until the other day when she started to have an increase in pain in her R SCM.  Has questions reguarding HEP with she has increased muscular pain.  Pain Assessment Currently in Pain?: Yes Pain Score:   3 Pain Location: Neck  Exercise/Treatments Neck Exercises Neck Retraction: 5 reps Neck Retraction Limitations: NMR to establish appropriate chin tuck position with use of biofeedback.  Pt only able to complete 5 appropriate neck retractions in supine after NMR for 5 sec holds.   Manual Therapy Other Manual Therapy: Grade II-III AP joint mobs to C3-C7 (greatest restrcition to C7)  Physical Therapy Assessment and Plan PT Assessment and Plan Clinical Impression Statement: Pt had improve cervical rotation after treatment today which focused on NMR to chin tuck and manual techniques to improve cervical musculature strength and length.  Pt only able to achieve on the biofeedback machine, however was able to disengage inappropriate muscles and use anterior cervical musculature.  PT Plan: Continue to progress to prone on elbows with cervical motions. f/u on joint mobs.     Goals    Problem List Patient Active Problem List  Diagnoses  . Cervicalgia  . Posture abnormality  . Muscle weakness (generalized)       GP No functional reporting required  Sandra Henry 12/24/2011, 2:54 PM

## 2011-12-25 ENCOUNTER — Ambulatory Visit (HOSPITAL_COMMUNITY)
Admission: RE | Admit: 2011-12-25 | Discharge: 2011-12-25 | Disposition: A | Discharge status: Home / Self Care | Payer: Medicare Other | Source: Ambulatory Visit | Attending: Family Medicine | Admitting: Family Medicine

## 2011-12-25 DIAGNOSIS — M6281 Muscle weakness (generalized): Secondary | ICD-10-CM

## 2011-12-25 DIAGNOSIS — M542 Cervicalgia: Secondary | ICD-10-CM | POA: Diagnosis not present

## 2011-12-25 DIAGNOSIS — R293 Abnormal posture: Secondary | ICD-10-CM

## 2011-12-25 DIAGNOSIS — IMO0001 Reserved for inherently not codable concepts without codable children: Secondary | ICD-10-CM | POA: Diagnosis not present

## 2011-12-25 NOTE — Progress Notes (Signed)
Physical Therapy Treatment Patient Details  Name: MOESHA SARCHET MRN: [id number] Date of Birth: January 23, 1946  Today's Date: 12/25/2011 Time: 1351-1430 Time Calculation (min): 39 min Charges: 30' Manual, 9 min NMR Visit#: 11  of 16   Re-eval: 01/16/12    Subjective: Symptoms/Limitations Symptoms: Pt reports she has increased spasms typically in the afternoon. Pain Assessment Currently in Pain?: Yes Pain Score:   3 (w/R SB) Pain Location: Neck  Exercise/Treatments Neck Exercises Neck Retraction: 10 reps Neck Retraction Limitations: NMR initally, able to complete 10x10 sec holds in supine, addeded 5x10 sec hold chin lifts  Manual Therapy Other Manual Therapy: Grade II-III AP joint mobs to C3-C7 . SCS to L UT. reports of pain after is 1/10 w/RSB  Physical Therapy Assessment and Plan PT Assessment and Plan Clinical Impression Statement: Pt reports a decrease in R SB today after manual and stabilization treatment of a 1/10.  She had improved quality of cervical rotation to both sides as well.  Continues to demostrate most difficulty with extension and increased tone to L UT.  PT Plan: Continue to progress to prone on elbows with cervical motions. f/u on joint mobs    Goals    Problem List Patient Active Problem List  Diagnoses  . Cervicalgia  . Posture abnormality  . Muscle weakness (generalized)    Kristol Almanzar 12/25/2011, 2:40 PM

## 2011-12-31 ENCOUNTER — Ambulatory Visit (HOSPITAL_COMMUNITY)
Admission: RE | Admit: 2011-12-31 | Discharge: 2011-12-31 | Disposition: A | Discharge status: Home / Self Care | Payer: Medicare Other | Source: Ambulatory Visit

## 2011-12-31 DIAGNOSIS — IMO0001 Reserved for inherently not codable concepts without codable children: Secondary | ICD-10-CM | POA: Diagnosis not present

## 2011-12-31 DIAGNOSIS — M542 Cervicalgia: Secondary | ICD-10-CM | POA: Diagnosis not present

## 2011-12-31 DIAGNOSIS — R293 Abnormal posture: Secondary | ICD-10-CM

## 2011-12-31 DIAGNOSIS — M6281 Muscle weakness (generalized): Secondary | ICD-10-CM

## 2011-12-31 NOTE — Progress Notes (Signed)
Physical Therapy Treatment Patient Details  Name: Sandra Henry MRN: [id number] Date of Birth: [birth year]  Today's Date: 12/31/2011 Time: 4098-1191 Time Calculation (min): 43 min Charges: 33' Manual, 10' NMR Visit#: 12  of 16   Re-eval: 01/16/12    Subjective: Symptoms/Limitations Symptoms: She states that this week she is starting to really feel a lot better, but reports she is not working as much as usual.  She reports that her Left side no longer has pain, and it is her R side that is the worst.  Pain Assessment Currently in Pain?: Yes Pain Score:   3 (cervical flexion and R rotation to R UT) Pain Location: Neck Pain Orientation: Right  Exercise/Treatments Neck Exercises Neck Flexion: 5 reps;Limitations Neck Flexion Limitations: POE w/manual facilition for correct movement Neck Extension: 5 reps;Limitations Neck Extension Limitations: POE w/manual facilition for correct movement Neck Rotation - Right: 10 reps;AROM Neck Rotation - Right Limitations: POE w/manual facilition for correct movement Neck Rotation - Left: 10 reps;AROM Neck Rotation - Left Limitations: POE w/manual facilition for correct movement  Manual Therapy Manual Therapy: Joint mobilization Joint Mobilization: Grade II-III PA to C2-C7 in supine; AP to C3 with pt in seated position. SCS to R UT/middle scalene region to multiple locations w/STM after.  Released by 80%.   Physical Therapy Assessment and Plan PT Assessment and Plan Clinical Impression Statement: Pt demonstrates ability to independently decrease unwanted muscle activation through the use of diaphragmatic breathing.  She has a notable improvement in rotation (R>L). is now limited at end range of R rot and neck flexion.  After treatment today she had improved quality of movement with rotation and extension and increased neck flexion with reports of 1/10 pain to R transverse process of C3 PT Plan: F/U w/treatment today. Cont with POE activities  including PNF patterns.    Goals    Problem List Patient Active Problem List  Diagnoses  . Cervicalgia  . Posture abnormality  . Muscle weakness (generalized)       GP No functional reporting required  Jermale Crass 12/31/2011, 9:42 AM

## 2011-12-31 NOTE — Patient Instructions (Signed)
C-

## 2012-01-01 ENCOUNTER — Ambulatory Visit (HOSPITAL_COMMUNITY)
Admission: RE | Admit: 2012-01-01 | Discharge: 2012-01-01 | Disposition: A | Discharge status: Home / Self Care | Payer: Medicare Other | Source: Ambulatory Visit | Attending: Family Medicine | Admitting: Family Medicine

## 2012-01-01 DIAGNOSIS — M6281 Muscle weakness (generalized): Secondary | ICD-10-CM

## 2012-01-01 DIAGNOSIS — R293 Abnormal posture: Secondary | ICD-10-CM

## 2012-01-01 DIAGNOSIS — M542 Cervicalgia: Secondary | ICD-10-CM | POA: Diagnosis not present

## 2012-01-01 DIAGNOSIS — IMO0001 Reserved for inherently not codable concepts without codable children: Secondary | ICD-10-CM | POA: Diagnosis not present

## 2012-01-01 NOTE — Progress Notes (Signed)
Physical Therapy Treatment Patient Details  Name: Sandra Henry MRN: [id number] Date of Birth: 11-30-1945  Today's Date: 01/01/2012 Time: 7829-5621 Time Calculation (min): 47 min Charges: 29' Manual; 10' TE, 1 ice Visit#: 13  of 16   Re-eval: 01/16/12    Subjective: Symptoms/Limitations Symptoms: Pt reports that she has been stressed at work today, but has not had time to think about her neck too much.   Pain Assessment Currently in Pain?: Yes Pain Score:   3 (R cervical rotation) Pain Location: Neck Pain Orientation: Right  Exercise/Treatments Neck Exercises Neck Rotation - Right: 10 reps Neck Rotation - Right Limitations: supine and 10 seated Neck Rotation - Left: 10 reps;AROM Neck Rotation - Left Limitations: supine and 10 seated Upper Extremity Lift: 5 reps;Alternating (w/chin tuck Supine) Additional Neck Exercises Wall Pushups/Modified Pushups: Serratus push ups (POE) 10x  Modalities Modalities: Cryotherapy Manual Therapy Manual Therapy: Joint mobilization Joint Mobilization: Grade II-III PA to C2-C7 in supine; AP to C3 with pt in seated position. SCS to R UT/middle scalene region to multiple locations w/STM after.  Improved R rotation and  decreased pain.  Cryotherapy Number Minutes Cryotherapy: 10 Minutes Cryotherapy Location: Neck Type of Cryotherapy: Ice pack  Physical Therapy Assessment and Plan PT Assessment and Plan Clinical Impression Statement: dramatic improvement with her rotation and improved posture (decreased kyphosis).  has pain only at end range of R rotation (which is within 5% of normal).  Added ice today to decrease inflammation to insertion of SCM  PT Plan: Cont with POE activites including PNF when able.     Goals    Problem List Patient Active Problem List  Diagnoses  . Cervicalgia  . Posture abnormality  . Muscle weakness (generalized)       GP No functional reporting required  Lilymarie Scroggins 01/01/2012, 2:00 PM

## 2012-01-07 ENCOUNTER — Ambulatory Visit (HOSPITAL_COMMUNITY)
Admission: RE | Admit: 2012-01-07 | Discharge: 2012-01-07 | Disposition: A | Discharge status: Home / Self Care | Payer: Medicare Other | Source: Ambulatory Visit

## 2012-01-07 DIAGNOSIS — M6281 Muscle weakness (generalized): Secondary | ICD-10-CM

## 2012-01-07 DIAGNOSIS — M542 Cervicalgia: Secondary | ICD-10-CM

## 2012-01-07 DIAGNOSIS — IMO0001 Reserved for inherently not codable concepts without codable children: Secondary | ICD-10-CM | POA: Diagnosis not present

## 2012-01-07 DIAGNOSIS — R293 Abnormal posture: Secondary | ICD-10-CM

## 2012-01-07 NOTE — Progress Notes (Signed)
Physical Therapy Treatment Patient Details  Name: JOHNATHAN TORTORELLI MRN: [id number] Date of Birth: Feb 09, 1946  Today's Date: 01/07/2012 Time: 1305-1400 Time Calculation (min): 55 min Charges: 10' Manual, 30' TE, 15' Estim Visit#: 14  of 16   Re-eval: 01/16/12    Subjective: Symptoms/Limitations Symptoms: I just had a bad spasm yesterday morning when I was on the phone at home and work.  So today I am having a lot more pain today on the right side.  Objective: Signficant spasm to her R cervical region Pain Assessment Currently in Pain?: Yes Pain Score:   2  Exercise/Treatments Neck Exercises Neck Flexion: Limitations Neck Flexion Limitations: 7 reps POE w/manual assistance Neck Extension: Limitations Neck Extension Limitations: 7 reps POE w/manual assistance Neck Lateral Flexion - Right: 5 reps;Seated Neck Lateral Flexion - Left: 5 reps;Seated Neck Rotation - Right: Limitations Neck Rotation - Right Limitations: 7 reps POE w/manual assistance Neck Rotation - Left: Limitations Neck Rotation - Left Limitations: 7 reps POE w/manual assistance; PNF D1 pattern both directions 5x each with manual assistance Upper Extremity Lift: 10 reps;Limitations (Standing w/chin tuck against wall BUE) Uppder Extremity Lift Limitations: Chin Tuck with CW and CCW ball rotations BUE 1 min each Additional Neck Exercises Wall Pushups/Modified Pushups: Serratus push ups (POE) 10x  Manual Therapy Manual Therapy: Joint mobilization Joint Mobilization: Grade II-III PA to C2-T4 in supine; AP to C2-4 with pt in seated position. SCS to R UT/middle scalene region to multiple locations w/STM after. Pharmacologist Location: IFES to middle cervical and shoulder region  Electrical Stimulation Action: IFES Electrical Stimulation Parameters: 15 minutes w/heat Electrical Stimulation Goals: Pain  Physical Therapy Assessment and Plan PT Assessment and Plan Clinical Impression  Statement: continues to have improved AROM in all directions, however continues to have pain during end range of motion.  PT Plan: Continue to progress    Goals    Problem List Patient Active Problem List  Diagnoses  . Cervicalgia  . Posture abnormality  . Muscle weakness (generalized)       GP No functional reporting required  Breezie Micucci 01/07/2012, 3:03 PM

## 2012-01-08 ENCOUNTER — Ambulatory Visit (HOSPITAL_COMMUNITY)
Admission: RE | Admit: 2012-01-08 | Discharge: 2012-01-08 | Disposition: A | Discharge status: Home / Self Care | Payer: Medicare Other | Source: Ambulatory Visit

## 2012-01-08 DIAGNOSIS — M542 Cervicalgia: Secondary | ICD-10-CM | POA: Diagnosis not present

## 2012-01-08 DIAGNOSIS — M6281 Muscle weakness (generalized): Secondary | ICD-10-CM

## 2012-01-08 DIAGNOSIS — IMO0001 Reserved for inherently not codable concepts without codable children: Secondary | ICD-10-CM | POA: Diagnosis not present

## 2012-01-08 DIAGNOSIS — R293 Abnormal posture: Secondary | ICD-10-CM

## 2012-01-08 NOTE — Progress Notes (Signed)
Physical Therapy Treatment Patient Details  Name: Sandra Henry MRN: [id number] Date of Birth: [birth year]  Today's Date: 01/08/2012 Time: 4098-1191 Time Calculation (min): 40 min Charges: 1 e-stim, 15' TE, 15' Manual Visit#: 15  of 16   Re-eval: 01/16/12    Subjective: Symptoms/Limitations Symptoms: "I hate to say it, but I am just so stiff this morning.  I put heat on my neck last night before I went to bed and this morning I woke up very stiff." Pain Assessment Currently in Pain?: Yes Pain Score:   2 Pain Location: Neck  Precautions/Restrictions     Exercise/Treatments Neck Exercises Neck Lateral Flexion - Right: 10 reps Neck Lateral Flexion - Right Limitations: w/scap retraction Neck Lateral Flexion - Left: 10 reps (w/scap retraction) Neck Rotation - Right: Limitations;Other (comment) (w/scap retraction) Neck Rotation - Right Limitations: 12 reps Neck Rotation - Left: Limitations Neck Rotation - Left Limitations: 12 reps w/scap retration  Modalities Modalities: Electrical Stimulation Manual Therapy Manual Therapy: Myofascial release Joint Mobilization: Grade II Joint mobs to R C3-4 Transverse process.   Myofascial Release: in sitting MFR with R rotation to decrease muscular spasms.  Other Manual Therapy: NMR using kinesotape to R UT Electrical Stimulation Electrical Stimulation Location: IFES to middle cervical and shoulder region  Electrical Stimulation Parameters: 10 minutes before treatment Electrical Stimulation Goals: Pain  Physical Therapy Assessment and Plan PT Assessment and Plan Clinical Impression Statement: Today's treatment focus on proper alignment and deactivating R SCM muscle.  She continues to demonstrate increased spasms to her R SCM muscle which has the greatest amount of pain with R rotation.   PT Plan: Re-assess next visit    Goals    Problem List Patient Active Problem List  Diagnoses  . Cervicalgia  . Posture abnormality  . Muscle  weakness (generalized)       GP No functional reporting required  Mehar Sagen 01/08/2012, 1:50 PM

## 2012-01-11 ENCOUNTER — Ambulatory Visit (HOSPITAL_COMMUNITY)
Admission: RE | Admit: 2012-01-11 | Discharge: 2012-01-11 | Disposition: A | Discharge status: Home / Self Care | Payer: Medicare Other | Source: Ambulatory Visit | Attending: Neurological Surgery | Admitting: Neurological Surgery

## 2012-01-11 DIAGNOSIS — M6281 Muscle weakness (generalized): Secondary | ICD-10-CM | POA: Diagnosis not present

## 2012-01-11 DIAGNOSIS — R293 Abnormal posture: Secondary | ICD-10-CM

## 2012-01-11 DIAGNOSIS — M542 Cervicalgia: Secondary | ICD-10-CM | POA: Insufficient documentation

## 2012-01-11 DIAGNOSIS — IMO0001 Reserved for inherently not codable concepts without codable children: Secondary | ICD-10-CM | POA: Diagnosis not present

## 2012-01-11 NOTE — Progress Notes (Signed)
Physical Therapy Treatment Patient Details  Name: Sandra Henry MRN: [id number] Date of Birth: 1945-11-19  Today's Date: 01/11/2012 Time: 4401-0272 Time Calculation (min): 39 min Charges: 30' manual, 9' TE Visit#: 16  of 16   Re-eval: 01/16/12    Subjective: Symptoms/Limitations Symptoms: I had a very stressful day and I feel very spasmed today.  The service on Sunday went very well and I think I did a lot better then I thought I would.   Overall I feel about 60-70% better.  Pain Assessment Currently in Pain?: Yes Pain Score:   2 Pain Location: Neck  Exercise/Treatments Capital Flexion: 5 sec holds x15 Cervical L rotation x10 w/cueing to decrease R UT activation.  Cervical R rotation x10 w/cueing to decrease R UT activiation  Manual Therapy Manual Therapy: Joint mobilization Joint Mobilization: Grade III-IV PA, AP joint mobs in supine C3-C5.  R clavicular mobs with R cervical rotation movement Myofascial Release: To SCM and UT  Physical Therapy Assessment and Plan PT Assessment and Plan Clinical Impression Statement: Today's treatment focused on decreasing overall pain at end range.  She was able to achieve greater ROM and decreased pain to her R cervical and shoulder region after treatment today.   She responded very well with MFR treatment today.  PT Plan: Re-evaluate next visit.     Goals    Problem List Patient Active Problem List  Diagnoses  . Cervicalgia  . Posture abnormality  . Muscle weakness (generalized)    Sandra Henry 01/11/2012, 6:01 PM

## 2012-01-14 ENCOUNTER — Ambulatory Visit (HOSPITAL_COMMUNITY): Payer: Medicare Other | Admitting: Physical Therapy

## 2012-01-26 ENCOUNTER — Ambulatory Visit (HOSPITAL_COMMUNITY)
Admission: RE | Admit: 2012-01-26 | Discharge: 2012-01-26 | Disposition: A | Discharge status: Home / Self Care | Payer: Medicare Other | Source: Ambulatory Visit | Attending: Family Medicine | Admitting: Family Medicine

## 2012-01-26 DIAGNOSIS — M6281 Muscle weakness (generalized): Secondary | ICD-10-CM

## 2012-01-26 DIAGNOSIS — M542 Cervicalgia: Secondary | ICD-10-CM

## 2012-01-26 DIAGNOSIS — R293 Abnormal posture: Secondary | ICD-10-CM

## 2012-01-26 NOTE — Evaluation (Signed)
Physical Therapy Evaluation  Patient Details  Name: Sandra Henry MRN: [id number] Date of Birth: 1946-04-08  Today's Date: 01/26/2012 Time: 4098-1191 Time Calculation (min): 45 min Charges: 1 ROM, 1 MMT, Manual x15, NMR x 10, 10 self care Visit#: 17  of 21   Re-eval: 02/25/12    Past Medical History: No past medical history on file. Past Surgical History: No past surgical history on file.  Subjective Symptoms/Limitations Symptoms: Pt reports that overall she is doing well with her ROM.  She continues to struggle with increased pain with flexion (reading, looking down to pay her bills for a long period time).  Has pain after 5 minutes of looking down.   She reports that she is about 80% better.  She has limited spasms everyday (but have decreased in intensity) Pain Assessment Currently in Pain?: Yes Pain Score:   2  Assessment Cervical AROM Cervical Extension: 35 (was 30, reports decreased pain) Cervical - Right Side Bend: 37 (was 27) Cervical - Left Side Bend: 33 (was 25) Cervical - Right Rotation: 70 (was 70) Cervical - Left Rotation: 65 (was 52) Cervical Strength Cervical Flexion: 5/5 Cervical Extension: 5/5 Cervical - Right Side Bend: 5/5 Cervical - Left Side Bend: 5/5 Cervical - Right Rotation: 5/5 Cervical - Left Rotation: 5/5 Palpation Palpation: Continues to have significant spasms to her bilateral scalene region. Decreased C5 PA mobility  Exercise/Treatments Neck Exercises Neck Retraction: Limitations Neck Retraction Limitations: Chin tucks and scap retraction while folding towels x9 minutes to work on NMR to decrease spasms. Education recieved to perform chin tucks and scap retraction when she begins to feel a stifness in her neck.  Educated on purposeful neck flexion.  Manual Therapy Joint Mobilization: Grade III-IV to C4-C6 PA mobs to spinous and transverse processes w/STM afterwards to decrease muscular spasms.  Physical Therapy Assessment and Plan PT  Assessment and Plan Clinical Impression Statement: Sandra Henry has attended 17 OP PT visits.  Recently she took 2 weeks off for vacation and overall she was able to generally maintain her cervical AROM and strength.  She reports overall she is 80% better, however still struggles with increased pain after about 5 minutes of looking down to read a book, fold laundry, pay bills etc.  She reports overall she continues to have spasms daily, however they have decreased in intensity.  She will continue to benefit from skilled OP PT in order to address remaining impairments.  Rehab Potential: Good PT Frequency: Min 1X/week PT Duration: 4 weeks PT Treatment/Interventions: Therapeutic activities;Functional mobility training;Therapeutic exercise;Neuromuscular re-education;Patient/family education;Other (comment) (Manual technqiues, spinal joint mobs, modalities PRN) PT Plan: f/u on chin tucks and scap retractions with cervical flexion activities    Goals Home Exercise Program Pt will Perform Home Exercise Program: Independently PT Short Term Goals Time to Complete Short Term Goals: 4 weeks PT Short Term Goal 1: The patient will report that she can make it 6 hours at work before her neck starts to bother her increasing her QOL and tolerance of work activities.   PT Short Term Goal 1 - Progress: Progressing toward goal PT Short Term Goal 2: The patient will report an average daily pain of less than 3/10 to show improved QOL and increased tolerance of functional activities.   PT Short Term Goal 2 - Progress: Met PT Short Term Goal 3: The patinet will increase her cervical ROM by 5 degrees throughout to show improved cervical ROM and increased ability to check her blind spot when driving.  PT Short Term Goal 3 - Progress: Met PT Long Term Goals Time to Complete Long Term Goals: 8 weeks PT Long Term Goal 1: The patient will report that she is able to make it a whole day at work with minimal neck discomfort.     PT Long Term Goal 1 - Progress: Progressing toward goal PT Long Term Goal 2: The patietn will report less than 2/10 neck pain on average daily to show improved QOL and tolerance of functional activities.   PT Long Term Goal 2 - Progress: Progressing toward goal Long Term Goal 3: The patient will increase cervical ROM by 10 degrees in areas of deficits to show improved cervical spine mobility.   Long Term Goal 3 Progress: Met Long Term Goal 4: The pateint will increase cervical strength to 5/5 to show improved cervical spine stability.  Long Term Goal 4 Progress: Met  Problem List Patient Active Problem List  Diagnoses  . Cervicalgia  . Posture abnormality  . Muscle weakness (generalized)    PT Plan of Care PT Home Exercise Plan: chin tucks and scap retraction w/cervical flexion activities to decrease pain.  Consulted and Agree with Plan of Care: Patient   Nickolaus Bordelon 01/26/2012, 8:55 AM  Physician Documentation Your signature is required to indicate approval of the treatment plan as stated above.  Please sign and either send electronically or make a copy of this report for your files and return this physician signed original.   Please mark one 1.__approve of plan  2. ___approve of plan with the following conditions.   ______________________________                                                          _____________________ Physician Signature                                                                                                             Date

## 2012-02-02 ENCOUNTER — Other Ambulatory Visit (HOSPITAL_COMMUNITY): Payer: Self-pay | Admitting: Family Medicine

## 2012-02-02 DIAGNOSIS — M8580 Other specified disorders of bone density and structure, unspecified site: Secondary | ICD-10-CM

## 2012-02-02 DIAGNOSIS — Z139 Encounter for screening, unspecified: Secondary | ICD-10-CM

## 2012-02-02 DIAGNOSIS — I10 Essential (primary) hypertension: Secondary | ICD-10-CM

## 2012-02-03 ENCOUNTER — Ambulatory Visit (HOSPITAL_COMMUNITY)
Admission: RE | Admit: 2012-02-03 | Discharge: 2012-02-03 | Disposition: A | Discharge status: Home / Self Care | Payer: Medicare Other | Source: Ambulatory Visit | Attending: Family Medicine | Admitting: Family Medicine

## 2012-02-03 DIAGNOSIS — R293 Abnormal posture: Secondary | ICD-10-CM

## 2012-02-03 DIAGNOSIS — M542 Cervicalgia: Secondary | ICD-10-CM

## 2012-02-03 DIAGNOSIS — M6281 Muscle weakness (generalized): Secondary | ICD-10-CM

## 2012-02-03 NOTE — Progress Notes (Signed)
Physical Therapy Treatment Patient Details  Name: CULLEN VANALLEN MRN: [id number] Date of Birth: 02-03-1946  Today's Date: 02/03/2012 Time: 3244-0102 Time Calculation (min): 33 min Manual x25', NMR x8' Visit#: 18  of 21   Re-eval: 02/25/12    Subjective: Symptoms/Limitations Symptoms: She reports she has had increased spasms to her neck this week and has had a hard time decreasing them on her own.  Pain Assessment Currently in Pain?: Yes Pain Location: Neck  Precautions/Restrictions     Exercise/Treatments  Manual Therapy Joint Mobilization: Grade III-IV to C4-C6 PA mobs to spinous and transverse processes w/STM afterwards to decrease muscular spasms. Myofascial Release: To SCM and UT and Sub Occipital region Other Manual Therapy: Diaphragmatic breathing w/TPR to L UT region to decrease tendon tightness.  Suboccipital release and NMR w/TC to decreased UT activation and improve cervical ROM w/proper motor control.  Chin tucks w/head lifts x5 sec holds x10 after manual techniques  Physical Therapy Assessment and Plan PT Assessment and Plan Clinical Impression Statement: Today's treatment utilized manual techniques and TC's to decrease unwanted L UT activiation to decrease pain.  Had a 75% reduction in UT activiation after treatment today.  Continues to have decreased pain after session however it returns a few days later with increased activitiy. PT Plan: Cont to address pain and function    Goals    + Problem List Patient Active Problem List  Diagnoses  . Cervicalgia  . Posture abnormality  . Muscle weakness (generalized)       GP No functional reporting required  Cailan Antonucci 02/03/2012, 5:03 PM

## 2012-02-11 DIAGNOSIS — H53439 Sector or arcuate defects, unspecified eye: Secondary | ICD-10-CM | POA: Diagnosis not present

## 2012-02-11 DIAGNOSIS — H4011X0 Primary open-angle glaucoma, stage unspecified: Secondary | ICD-10-CM | POA: Diagnosis not present

## 2012-02-11 DIAGNOSIS — H409 Unspecified glaucoma: Secondary | ICD-10-CM | POA: Diagnosis not present

## 2012-02-11 DIAGNOSIS — H2510 Age-related nuclear cataract, unspecified eye: Secondary | ICD-10-CM | POA: Diagnosis not present

## 2012-02-17 DIAGNOSIS — I10 Essential (primary) hypertension: Secondary | ICD-10-CM | POA: Diagnosis not present

## 2012-02-17 DIAGNOSIS — E782 Mixed hyperlipidemia: Secondary | ICD-10-CM | POA: Diagnosis not present

## 2012-02-17 DIAGNOSIS — R5381 Other malaise: Secondary | ICD-10-CM | POA: Diagnosis not present

## 2012-02-18 ENCOUNTER — Inpatient Hospital Stay (HOSPITAL_COMMUNITY): Admission: RE | Admit: 2012-02-18 | Payer: Medicare Other | Source: Ambulatory Visit

## 2012-02-18 ENCOUNTER — Telehealth (HOSPITAL_COMMUNITY): Payer: Self-pay

## 2012-02-19 ENCOUNTER — Ambulatory Visit (HOSPITAL_COMMUNITY)
Admission: RE | Admit: 2012-02-19 | Discharge: 2012-02-19 | Disposition: A | Discharge status: Home / Self Care | Payer: Medicare Other | Source: Ambulatory Visit | Attending: Family Medicine | Admitting: Family Medicine

## 2012-02-19 DIAGNOSIS — M8580 Other specified disorders of bone density and structure, unspecified site: Secondary | ICD-10-CM

## 2012-02-19 DIAGNOSIS — Z1231 Encounter for screening mammogram for malignant neoplasm of breast: Secondary | ICD-10-CM | POA: Insufficient documentation

## 2012-02-19 DIAGNOSIS — I6529 Occlusion and stenosis of unspecified carotid artery: Secondary | ICD-10-CM | POA: Diagnosis not present

## 2012-02-19 DIAGNOSIS — M899 Disorder of bone, unspecified: Secondary | ICD-10-CM | POA: Diagnosis not present

## 2012-02-19 DIAGNOSIS — Z139 Encounter for screening, unspecified: Secondary | ICD-10-CM

## 2012-02-19 DIAGNOSIS — I10 Essential (primary) hypertension: Secondary | ICD-10-CM

## 2012-02-22 DIAGNOSIS — I10 Essential (primary) hypertension: Secondary | ICD-10-CM | POA: Diagnosis not present

## 2012-02-22 DIAGNOSIS — N6019 Diffuse cystic mastopathy of unspecified breast: Secondary | ICD-10-CM | POA: Diagnosis not present

## 2012-02-22 DIAGNOSIS — Z818 Family history of other mental and behavioral disorders: Secondary | ICD-10-CM | POA: Diagnosis not present

## 2012-02-22 DIAGNOSIS — I6529 Occlusion and stenosis of unspecified carotid artery: Secondary | ICD-10-CM | POA: Diagnosis not present

## 2012-02-25 ENCOUNTER — Ambulatory Visit (HOSPITAL_COMMUNITY)
Admission: RE | Admit: 2012-02-25 | Discharge: 2012-02-25 | Disposition: A | Discharge status: Home / Self Care | Payer: Medicare Other | Source: Ambulatory Visit | Attending: Neurological Surgery | Admitting: Neurological Surgery

## 2012-02-25 DIAGNOSIS — M6281 Muscle weakness (generalized): Secondary | ICD-10-CM | POA: Insufficient documentation

## 2012-02-25 DIAGNOSIS — M542 Cervicalgia: Secondary | ICD-10-CM | POA: Insufficient documentation

## 2012-02-25 DIAGNOSIS — IMO0001 Reserved for inherently not codable concepts without codable children: Secondary | ICD-10-CM | POA: Diagnosis not present

## 2012-02-25 DIAGNOSIS — R293 Abnormal posture: Secondary | ICD-10-CM

## 2012-02-25 NOTE — Evaluation (Signed)
Physical Therapy Re-Evaluation  Patient Details  Name: Sandra Henry MRN: [id number] Date of Birth: 1946-01-29  Today's Date: 02/25/2012 Time: 0812-0858 PT Time Calculation (min): 46 min Charges: 30 Manual, 1 estim Visit#: 19  of 23   Re-eval: 03/10/12   Authorization: MEDICARE   Subjective Symptoms/Limitations Symptoms: I am able to sit for at least 6 hours a day and my pain isnt terrible.  I keep working on the exercises everyday, but the spasms are still around.  Pain Assessment Currently in Pain?: Yes Pain Score:   2 Pain Location: Neck  Assessment Palpation Palpation: Muscle spasms to L scalene region with decreased tone to UT.  Improved PA mobility to C5 SP, however continues to have some stiffness.   Exercise/Treatments  Manual Therapy Manual Therapy: Joint mobilization Joint Mobilization: Grade III-IV to C4-C6 PA mobs to spinous and transverse processes w/STM afterwards to decrease muscular spasms Electrical Stimulation Electrical Stimulation Location: IFES to middle cervical Electrical Stimulation Action: IFES Electrical Stimulation Parameters: 15 Electrical Stimulation Goals: Pain  Physical Therapy Assessment and Plan PT Assessment and Plan Clinical Impression Statement: [name] returns today after 2 weeks off.  Continues to have ROM and strength WNL and has decreased spasms to her UT region and has centralized and is independent with her HEP.  She reports she continues to have good and bad days, but she has less bad days.  Overall continues to struggle with spasms to her neck which are relieved with manual therapy and e-stim.  She will continue to benefit x4 more visits to focus on pain and spasm reduction.  Pt will benefit from skilled therapeutic intervention in order to improve on the following deficits: Pain;Increased muscle spasms Rehab Potential: Good PT Frequency: Min 2X/week PT Duration:  (2 weeks) PT Plan: Cont with pain reduction using manual  treatment and modalities for pain control. G CODE next visit    Goals Home Exercise Program Pt will Perform Home Exercise Program: Independently PT Short Term Goals Time to Complete Short Term Goals: 4 weeks PT Short Term Goal 1: The patient will report that she can make it 6 hours at work before her neck starts to bother her increasing her QOL and tolerance of work activities.   PT Short Term Goal 1 - Progress: Met PT Short Term Goal 2: The patient will report an average daily pain of less than 3/10 to show improved QOL and increased tolerance of functional activities.   PT Short Term Goal 2 - Progress: Met PT Short Term Goal 3: The patinet will increase her cervical ROM by 5 degrees throughout to show improved cervical ROM and increased ability to check her blind spot when driving.   PT Short Term Goal 3 - Progress: Met PT Long Term Goals Time to Complete Long Term Goals: 8 weeks PT Long Term Goal 1: The patient will report that she is able to make it a whole day at work with minimal neck discomfort.   PT Long Term Goal 1 - Progress: Progressing toward goal PT Long Term Goal 2: The patietn will report less than 2/10 neck pain on average daily to show improved QOL and tolerance of functional activities.   PT Long Term Goal 2 - Progress: Progressing toward goal Long Term Goal 3: The patient will increase cervical ROM by 10 degrees in areas of deficits to show improved cervical spine mobility.   Long Term Goal 3 Progress: Met Long Term Goal 4: The pateint will increase cervical strength to 5/5  to show improved cervical spine stability.  Long Term Goal 4 Progress: Met  Problem List Patient Active Problem List  Diagnoses  . Cervicalgia  . Posture abnormality  . Muscle weakness (generalized)    PT - End of Session Activity Tolerance: Patient tolerated treatment well  Jacquis Paxton 02/25/2012, 4:47 PM  Physician Documentation Your signature is required to indicate approval of the  treatment plan as stated above.  Please sign and either send electronically or make a copy of this report for your files and return this physician signed original.   Please mark one 1.__approve of plan  2. ___approve of plan with the following conditions.   ______________________________                                                          _____________________ Physician Signature                                                                                                             Date

## 2012-03-03 ENCOUNTER — Ambulatory Visit (HOSPITAL_COMMUNITY)
Admission: RE | Admit: 2012-03-03 | Discharge: 2012-03-03 | Disposition: A | Discharge status: Home / Self Care | Payer: Medicare Other | Source: Ambulatory Visit | Attending: Family Medicine | Admitting: Family Medicine

## 2012-03-03 DIAGNOSIS — R293 Abnormal posture: Secondary | ICD-10-CM

## 2012-03-03 DIAGNOSIS — M542 Cervicalgia: Secondary | ICD-10-CM

## 2012-03-03 DIAGNOSIS — M6281 Muscle weakness (generalized): Secondary | ICD-10-CM

## 2012-03-03 NOTE — Progress Notes (Signed)
Physical Therapy Treatment Patient Details  Name: Sandra Henry MRN: [id number] Date of Birth: 1945-11-02  Today's Date: 03/03/2012 Time: 1302-1405 PT Time Calculation (min): 63 min Charges: 40' Manual, 1 estim Visit#: 20  of 23   Re-eval: 03/10/12    Authorization: MEDICARE  Authorization Time Period: Body Position: Current CK, Goal: CI  Authorization Visit#: 20  of 30    Subjective: Symptoms/Limitations Symptoms: My R side of my neck into my shoulder was sore today.  Pain Assessment Currently in Pain?: Yes Pain Score:   3 Pain Location: Neck  Exercise/Treatments  Modalities Modalities: Electrical Stimulation Manual Therapy Manual Therapy: Joint mobilization Joint Mobilization: Grade III-IV to C4-C6 PA mobs to spinous and transverse processes to improve ROM and decrease pain w/STM afterwards to decrease muscular spasms  Myofascial Release: to decrease R UT fascial restrcion w/STM after to R cervical TP's to decrease pain.  Pharmacologist Location: IFES to middle cervical  Electrical Stimulation Action: IFES Electrical Stimulation Parameters: 15 Electrical Stimulation Goals: Pain  Physical Therapy Assessment and Plan PT Assessment and Plan Clinical Impression Statement: Treatment focus on decreasing pain and improving ROM.  Pt had moderate increase in R shoulder pain to her UT region.  Sent referral to Dr. Danielle Dess for possible TENS unit.  PT Plan: cont to decrease pain and improve ROM. F/U on referral for TENS unit    Goals    Problem List Patient Active Problem List  Diagnoses  . Cervicalgia  . Posture abnormality  . Muscle weakness (generalized)    PT - End of Session Activity Tolerance: Patient tolerated treatment well  GP Current Status  [phone] - Changing & Maintaing Body Position CJ - At least 20% but less than 40% impaired, limited or restricted   Goal Status  [phone] - Changing & Maintaing Body Position CI - At least  1% but less than 20% impaired, limited or restricted    Starlyn Droge 03/03/2012, 2:15 PM

## 2012-03-09 ENCOUNTER — Ambulatory Visit (HOSPITAL_COMMUNITY)
Admission: RE | Admit: 2012-03-09 | Discharge: 2012-03-09 | Disposition: A | Discharge status: Home / Self Care | Payer: Medicare Other | Source: Ambulatory Visit | Attending: Family Medicine | Admitting: Family Medicine

## 2012-03-09 DIAGNOSIS — R293 Abnormal posture: Secondary | ICD-10-CM

## 2012-03-09 DIAGNOSIS — M542 Cervicalgia: Secondary | ICD-10-CM

## 2012-03-09 DIAGNOSIS — M6281 Muscle weakness (generalized): Secondary | ICD-10-CM

## 2012-03-09 NOTE — Progress Notes (Signed)
Physical Therapy Treatment Patient Details  Name: Sandra Henry MRN: [id number] Date of Birth: 12-25-1945  Today's Date: 03/09/2012 Time: 1017-1116 PT Time Calculation (min): 59 min Charges:  Visit#: 21  of 23   Re-eval: 03/10/12   Authorization: MEDICARE  Authorization Time Period: Body Position: Current CK, Goal: CI  Authorization Visit#: 21  of 30    Subjective: Symptoms/Limitations Symptoms: My neck is feeling great, but my right shoulder is really sore.  I had problems with this right shoulder in the past.   Pain Assessment Currently in Pain?: Yes Pain Location: Shoulder Pain Orientation: Right  Exercise/Treatments Neck Exercises X to V: 20 reps Uppder Extremity Lift Limitations: Shoulder Rolls Backwards 20 reps  Modalities Modalities: Electrical Stimulation Manual Therapy Manual Therapy: Joint mobilization Joint Mobilization: Grade III-IV to C4-C6 to improve ROM and decrease pain.  Grade I-II to R GH to decrease pain with UE distraction. Other Manual Therapy: STM to R UT and shoulder region to decrease fascial restrictions and decrease pain.  Moist Heat Therapy Number Minutes Moist Heat: 15 Minutes Moist Heat Location: Other (comment) (neck ) Electrical Stimulation Electrical Stimulation Location: Pre-mod to R UT region  Electrical Stimulation Action: Pre-mod Electrical Stimulation Parameters: 15 minutes 16.0 Volts  Electrical Stimulation Goals: Pain  Physical Therapy Assessment and Plan PT Assessment and Plan Clinical Impression Statement: Pt reports decreased pain after treatment today to her cervical and shoulder region.   PT Plan: Re-evaluate next visit.     Goals    Problem List Patient Active Problem List  Diagnoses  . Cervicalgia  . Posture abnormality  . Muscle weakness (generalized)    PT - End of Session Activity Tolerance: Patient tolerated treatment well  GP No functional reporting required  [name] 03/09/2012, 11:11 AM

## 2012-03-10 ENCOUNTER — Ambulatory Visit (HOSPITAL_COMMUNITY)
Admission: RE | Admit: 2012-03-10 | Discharge: 2012-03-10 | Disposition: A | Discharge status: Home / Self Care | Payer: Medicare Other | Source: Ambulatory Visit | Attending: Family Medicine | Admitting: Family Medicine

## 2012-03-10 DIAGNOSIS — M6281 Muscle weakness (generalized): Secondary | ICD-10-CM

## 2012-03-10 DIAGNOSIS — R293 Abnormal posture: Secondary | ICD-10-CM

## 2012-03-10 DIAGNOSIS — M542 Cervicalgia: Secondary | ICD-10-CM
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
## 2012-03-16 ENCOUNTER — Ambulatory Visit (HOSPITAL_COMMUNITY)
Admission: RE | Admit: 2012-03-16 | Discharge: 2012-03-16 | Disposition: A | Discharge status: Home / Self Care | Payer: Medicare Other | Source: Ambulatory Visit | Attending: Neurological Surgery | Admitting: Neurological Surgery

## 2012-03-16 DIAGNOSIS — M6281 Muscle weakness (generalized): Secondary | ICD-10-CM | POA: Diagnosis not present

## 2012-03-16 DIAGNOSIS — M542 Cervicalgia: Secondary | ICD-10-CM | POA: Diagnosis not present

## 2012-03-16 DIAGNOSIS — IMO0001 Reserved for inherently not codable concepts without codable children: Secondary | ICD-10-CM | POA: Diagnosis not present

## 2012-03-16 DIAGNOSIS — R293 Abnormal posture: Secondary | ICD-10-CM

## 2012-03-16 NOTE — Progress Notes (Signed)
Physical Therapy Treatment/Discharge Patient Details  Name: Sandra Henry MRN: [id number] Date of Birth: April 30, 1946  Today's Date: 03/16/2012 Time: 0845-0910 PT Time Calculation (min): 25 min Charges: Self Care x25 Visit#: 23  of 23   Re-eval:    Subjective: Symptoms/Limitations Symptoms: I have been feeling pretty good lately.  I know the e-stim has really helped me out a lot.  Pain Assessment Currently in Pain?: No/denies  Precautions/Restrictions    Physical Therapy Assessment and Plan PT Assessment and Plan Clinical Impression Statement: TENS unit set up complete.  Pt educated on device, paper work sent into [employer].  Pt requires 4 pads to control bilateral neck pain.  PT Plan: D/C    Goals    Problem List Patient Active Problem List  Diagnoses  . Cervicalgia  . Posture abnormality  . Muscle weakness (generalized)    PT - End of Session Activity Tolerance: Patient tolerated treatment well PT Plan of Care PT Patient Instructions: Instruction on TENS unit.  Consulted and Agree with Plan of Care: Patient  GP Goal Status  [phone] - Changing & Maintaing Body Position CI - At least 1% but less than 20% impaired, limited or restricted    D/C Status  [id number] - Changing & Maintaing Body Position / CI - At least 1% but less than 20% impaired, limited or restricted CI - At least 1% but less than 20% impaired, limited or restricted     Morse Brueggemann 03/16/2012, 9:31 AM

## 2012-04-25 DIAGNOSIS — N6019 Diffuse cystic mastopathy of unspecified breast: Secondary | ICD-10-CM | POA: Diagnosis not present

## 2012-04-25 DIAGNOSIS — M47812 Spondylosis without myelopathy or radiculopathy, cervical region: Secondary | ICD-10-CM | POA: Diagnosis not present

## 2012-04-25 DIAGNOSIS — I10 Essential (primary) hypertension: Secondary | ICD-10-CM | POA: Diagnosis not present

## 2012-04-25 DIAGNOSIS — M542 Cervicalgia: Secondary | ICD-10-CM | POA: Diagnosis not present

## 2012-05-24 DIAGNOSIS — E7800 Pure hypercholesterolemia, unspecified: Secondary | ICD-10-CM | POA: Diagnosis not present

## 2012-06-01 DIAGNOSIS — M47812 Spondylosis without myelopathy or radiculopathy, cervical region: Secondary | ICD-10-CM | POA: Diagnosis not present

## 2012-06-01 DIAGNOSIS — I10 Essential (primary) hypertension: Secondary | ICD-10-CM | POA: Diagnosis not present

## 2012-06-01 DIAGNOSIS — E7800 Pure hypercholesterolemia, unspecified: Secondary | ICD-10-CM | POA: Diagnosis not present

## 2012-06-01 DIAGNOSIS — M542 Cervicalgia: Secondary | ICD-10-CM | POA: Diagnosis not present

## 2012-06-16 DIAGNOSIS — H4011X0 Primary open-angle glaucoma, stage unspecified: Secondary | ICD-10-CM | POA: Diagnosis not present

## 2012-06-16 DIAGNOSIS — H409 Unspecified glaucoma: Secondary | ICD-10-CM | POA: Diagnosis not present

## 2012-07-01 DIAGNOSIS — R6889 Other general symptoms and signs: Secondary | ICD-10-CM | POA: Diagnosis not present

## 2012-07-01 DIAGNOSIS — Z23 Encounter for immunization: Secondary | ICD-10-CM | POA: Diagnosis not present

## 2012-09-15 DIAGNOSIS — H4011X0 Primary open-angle glaucoma, stage unspecified: Secondary | ICD-10-CM | POA: Diagnosis not present

## 2012-12-15 DIAGNOSIS — H4011X0 Primary open-angle glaucoma, stage unspecified: Secondary | ICD-10-CM | POA: Diagnosis not present

## 2013-01-23 DIAGNOSIS — I10 Essential (primary) hypertension: Secondary | ICD-10-CM | POA: Diagnosis not present

## 2013-01-23 DIAGNOSIS — E7800 Pure hypercholesterolemia, unspecified: Secondary | ICD-10-CM | POA: Diagnosis not present

## 2013-02-13 ENCOUNTER — Other Ambulatory Visit (HOSPITAL_COMMUNITY): Payer: Self-pay | Admitting: Family Medicine

## 2013-02-13 DIAGNOSIS — Z139 Encounter for screening, unspecified: Secondary | ICD-10-CM

## 2013-02-20 ENCOUNTER — Ambulatory Visit (HOSPITAL_COMMUNITY): Payer: Medicare Other

## 2013-02-21 ENCOUNTER — Ambulatory Visit (HOSPITAL_COMMUNITY)
Admission: RE | Admit: 2013-02-21 | Discharge: 2013-02-21 | Disposition: A | Discharge status: Home / Self Care | Payer: Medicare Other | Source: Ambulatory Visit | Attending: Family Medicine | Admitting: Family Medicine

## 2013-02-21 DIAGNOSIS — Z139 Encounter for screening, unspecified: Secondary | ICD-10-CM

## 2013-02-21 DIAGNOSIS — Z1231 Encounter for screening mammogram for malignant neoplasm of breast: Secondary | ICD-10-CM | POA: Insufficient documentation

## 2013-02-24 ENCOUNTER — Other Ambulatory Visit: Payer: Self-pay

## 2013-02-24 DIAGNOSIS — Z124 Encounter for screening for malignant neoplasm of cervix: Secondary | ICD-10-CM | POA: Diagnosis not present

## 2013-03-14 DIAGNOSIS — M542 Cervicalgia: Secondary | ICD-10-CM | POA: Diagnosis not present

## 2013-03-14 DIAGNOSIS — I10 Essential (primary) hypertension: Secondary | ICD-10-CM | POA: Diagnosis not present

## 2013-03-14 DIAGNOSIS — N951 Menopausal and female climacteric states: Secondary | ICD-10-CM | POA: Diagnosis not present

## 2013-03-14 DIAGNOSIS — E7800 Pure hypercholesterolemia, unspecified: Secondary | ICD-10-CM | POA: Diagnosis not present

## 2013-03-30 DIAGNOSIS — H4011X0 Primary open-angle glaucoma, stage unspecified: Secondary | ICD-10-CM | POA: Diagnosis not present

## 2013-04-24 DIAGNOSIS — L570 Actinic keratosis: Secondary | ICD-10-CM | POA: Diagnosis not present

## 2013-04-24 DIAGNOSIS — D235 Other benign neoplasm of skin of trunk: Secondary | ICD-10-CM | POA: Diagnosis not present

## 2013-05-29 DIAGNOSIS — Z23 Encounter for immunization: Secondary | ICD-10-CM | POA: Diagnosis not present

## 2013-06-07 DIAGNOSIS — L570 Actinic keratosis: Secondary | ICD-10-CM | POA: Diagnosis not present

## 2013-06-07 DIAGNOSIS — B079 Viral wart, unspecified: Secondary | ICD-10-CM | POA: Diagnosis not present

## 2013-06-22 DIAGNOSIS — H4011X0 Primary open-angle glaucoma, stage unspecified: Secondary | ICD-10-CM | POA: Diagnosis not present

## 2013-06-22 DIAGNOSIS — H2510 Age-related nuclear cataract, unspecified eye: Secondary | ICD-10-CM | POA: Diagnosis not present

## 2013-07-18 DIAGNOSIS — Z23 Encounter for immunization: Secondary | ICD-10-CM | POA: Diagnosis not present

## 2013-07-21 DIAGNOSIS — L723 Sebaceous cyst: Secondary | ICD-10-CM | POA: Diagnosis not present

## 2013-07-21 DIAGNOSIS — L570 Actinic keratosis: Secondary | ICD-10-CM | POA: Diagnosis not present

## 2013-09-04 DIAGNOSIS — M19049 Primary osteoarthritis, unspecified hand: Secondary | ICD-10-CM | POA: Diagnosis not present

## 2013-09-15 DIAGNOSIS — H4010X0 Unspecified open-angle glaucoma, stage unspecified: Secondary | ICD-10-CM | POA: Diagnosis not present

## 2013-09-15 DIAGNOSIS — H18419 Arcus senilis, unspecified eye: Secondary | ICD-10-CM | POA: Diagnosis not present

## 2013-09-15 DIAGNOSIS — H2510 Age-related nuclear cataract, unspecified eye: Secondary | ICD-10-CM | POA: Diagnosis not present

## 2013-09-15 DIAGNOSIS — H25049 Posterior subcapsular polar age-related cataract, unspecified eye: Secondary | ICD-10-CM | POA: Diagnosis not present

## 2013-09-15 DIAGNOSIS — H02839 Dermatochalasis of unspecified eye, unspecified eyelid: Secondary | ICD-10-CM | POA: Diagnosis not present

## 2013-09-15 DIAGNOSIS — H25019 Cortical age-related cataract, unspecified eye: Secondary | ICD-10-CM | POA: Diagnosis not present

## 2013-10-09 DIAGNOSIS — M19049 Primary osteoarthritis, unspecified hand: Secondary | ICD-10-CM | POA: Diagnosis not present

## 2013-10-13 DIAGNOSIS — M6530 Trigger finger, unspecified finger: Secondary | ICD-10-CM | POA: Diagnosis not present

## 2013-10-13 DIAGNOSIS — M19049 Primary osteoarthritis, unspecified hand: Secondary | ICD-10-CM | POA: Diagnosis not present

## 2013-11-06 DIAGNOSIS — H269 Unspecified cataract: Secondary | ICD-10-CM | POA: Diagnosis not present

## 2013-11-06 DIAGNOSIS — H2510 Age-related nuclear cataract, unspecified eye: Secondary | ICD-10-CM | POA: Diagnosis not present

## 2013-11-07 DIAGNOSIS — H2510 Age-related nuclear cataract, unspecified eye: Secondary | ICD-10-CM | POA: Diagnosis not present

## 2013-11-10 DIAGNOSIS — M6530 Trigger finger, unspecified finger: Secondary | ICD-10-CM | POA: Diagnosis not present

## 2013-11-10 DIAGNOSIS — M19049 Primary osteoarthritis, unspecified hand: Secondary | ICD-10-CM | POA: Diagnosis not present

## 2013-11-24 DIAGNOSIS — H2510 Age-related nuclear cataract, unspecified eye: Secondary | ICD-10-CM | POA: Diagnosis not present

## 2013-11-24 DIAGNOSIS — H269 Unspecified cataract: Secondary | ICD-10-CM | POA: Diagnosis not present

## 2013-12-22 DIAGNOSIS — M19049 Primary osteoarthritis, unspecified hand: Secondary | ICD-10-CM | POA: Diagnosis not present

## 2013-12-22 DIAGNOSIS — M6530 Trigger finger, unspecified finger: Secondary | ICD-10-CM | POA: Diagnosis not present

## 2014-01-02 DIAGNOSIS — D261 Other benign neoplasm of corpus uteri: Secondary | ICD-10-CM | POA: Diagnosis not present

## 2014-01-02 DIAGNOSIS — N950 Postmenopausal bleeding: Secondary | ICD-10-CM | POA: Diagnosis not present

## 2014-01-19 DIAGNOSIS — M19049 Primary osteoarthritis, unspecified hand: Secondary | ICD-10-CM | POA: Diagnosis not present

## 2014-01-19 DIAGNOSIS — M6530 Trigger finger, unspecified finger: Secondary | ICD-10-CM | POA: Diagnosis not present

## 2014-01-26 ENCOUNTER — Other Ambulatory Visit: Payer: Self-pay | Admitting: Orthopedic Surgery

## 2014-02-01 ENCOUNTER — Other Ambulatory Visit: Payer: Self-pay

## 2014-02-01 DIAGNOSIS — Z1231 Encounter for screening mammogram for malignant neoplasm of breast: Secondary | ICD-10-CM

## 2014-02-09 DIAGNOSIS — M65841 Other synovitis and tenosynovitis, right hand: Secondary | ICD-10-CM

## 2014-02-09 HISTORY — DX: Other synovitis and tenosynovitis, right hand: M65.841

## 2014-02-16 ENCOUNTER — Encounter (HOSPITAL_BASED_OUTPATIENT_CLINIC_OR_DEPARTMENT_OTHER): Payer: Self-pay | Admitting: *Deleted

## 2014-02-16 ENCOUNTER — Encounter (HOSPITAL_BASED_OUTPATIENT_CLINIC_OR_DEPARTMENT_OTHER)
Admission: RE | Admit: 2014-02-16 | Discharge: 2014-02-16 | Disposition: A | Discharge status: Home / Self Care | Payer: Medicare Other | Source: Ambulatory Visit | Attending: Orthopedic Surgery | Admitting: Orthopedic Surgery

## 2014-02-16 DIAGNOSIS — Z01810 Encounter for preprocedural cardiovascular examination: Secondary | ICD-10-CM | POA: Diagnosis not present

## 2014-02-16 DIAGNOSIS — M65839 Other synovitis and tenosynovitis, unspecified forearm: Secondary | ICD-10-CM | POA: Diagnosis not present

## 2014-02-16 DIAGNOSIS — Z882 Allergy status to sulfonamides status: Secondary | ICD-10-CM | POA: Diagnosis not present

## 2014-02-16 DIAGNOSIS — I10 Essential (primary) hypertension: Secondary | ICD-10-CM | POA: Diagnosis not present

## 2014-02-16 DIAGNOSIS — H409 Unspecified glaucoma: Secondary | ICD-10-CM | POA: Diagnosis not present

## 2014-02-16 LAB — BASIC METABOLIC PANEL
BUN: 24 mg/dL — AB (ref 6–23)
CALCIUM: 10.3 mg/dL (ref 8.4–10.5)
CHLORIDE: 101 meq/L (ref 96–112)
CO2: 29 meq/L (ref 19–32)
CREATININE: 0.83 mg/dL (ref 0.50–1.10)
GFR calc Af Amer: 82 mL/min — ABNORMAL LOW (ref >90)
GFR calc non Af Amer: 71 mL/min — ABNORMAL LOW (ref >90)
Glucose, Bld: 101 mg/dL — ABNORMAL HIGH (ref 70–99)
Potassium: 3.9 mEq/L (ref 3.7–5.3)
Sodium: 142 mEq/L (ref 137–147)

## 2014-02-16 NOTE — Pre-Procedure Instructions (Signed)
To come for BMET and EKG 

## 2014-02-20 ENCOUNTER — Ambulatory Visit (HOSPITAL_BASED_OUTPATIENT_CLINIC_OR_DEPARTMENT_OTHER)
Admission: RE | Admit: 2014-02-20 | Discharge: 2014-02-20 | Disposition: A | Discharge status: Home / Self Care | Payer: Medicare Other | Source: Ambulatory Visit | Attending: Orthopedic Surgery | Admitting: Orthopedic Surgery

## 2014-02-20 ENCOUNTER — Ambulatory Visit (HOSPITAL_BASED_OUTPATIENT_CLINIC_OR_DEPARTMENT_OTHER): Payer: Medicare Other | Admitting: Certified Registered"

## 2014-02-20 ENCOUNTER — Encounter (HOSPITAL_BASED_OUTPATIENT_CLINIC_OR_DEPARTMENT_OTHER): Payer: Self-pay

## 2014-02-20 ENCOUNTER — Encounter (HOSPITAL_BASED_OUTPATIENT_CLINIC_OR_DEPARTMENT_OTHER): Payer: Medicare Other | Admitting: Certified Registered"

## 2014-02-20 ENCOUNTER — Encounter (HOSPITAL_BASED_OUTPATIENT_CLINIC_OR_DEPARTMENT_OTHER)
Admission: RE | Disposition: A | Discharge status: Home / Self Care | Payer: Self-pay | Source: Ambulatory Visit | Attending: Orthopedic Surgery

## 2014-02-20 DIAGNOSIS — H409 Unspecified glaucoma: Secondary | ICD-10-CM | POA: Insufficient documentation

## 2014-02-20 DIAGNOSIS — Z882 Allergy status to sulfonamides status: Secondary | ICD-10-CM | POA: Diagnosis not present

## 2014-02-20 DIAGNOSIS — I10 Essential (primary) hypertension: Secondary | ICD-10-CM | POA: Insufficient documentation

## 2014-02-20 DIAGNOSIS — M6530 Trigger finger, unspecified finger: Secondary | ICD-10-CM | POA: Diagnosis not present

## 2014-02-20 DIAGNOSIS — M65849 Other synovitis and tenosynovitis, unspecified hand: Principal | ICD-10-CM

## 2014-02-20 DIAGNOSIS — M65839 Other synovitis and tenosynovitis, unspecified forearm: Secondary | ICD-10-CM | POA: Insufficient documentation

## 2014-02-20 HISTORY — DX: Dental restoration status: Z98.811

## 2014-02-20 HISTORY — DX: Unspecified glaucoma: H40.9

## 2014-02-20 HISTORY — DX: Other seasonal allergic rhinitis: J30.2

## 2014-02-20 HISTORY — DX: Essential (primary) hypertension: I10

## 2014-02-20 HISTORY — DX: Other synovitis and tenosynovitis, right hand: M65.841

## 2014-02-20 HISTORY — PX: TRIGGER FINGER RELEASE: SHX641

## 2014-02-20 HISTORY — DX: Presence of dental prosthetic device (complete) (partial): Z97.2

## 2014-02-20 LAB — POCT HEMOGLOBIN-HEMACUE: Hemoglobin: 15.2 g/dL — ABNORMAL HIGH (ref 12.0–15.0)

## 2014-02-20 SURGERY — RELEASE, A1 PULLEY, FOR TRIGGER FINGER
Anesthesia: Monitor Anesthesia Care | Site: Finger | Laterality: Right

## 2014-02-20 MED ORDER — TRAMADOL HCL 50 MG PO TABS: 50.0000 mg | ORAL_TABLET | Freq: Four times a day (QID) | ORAL | Status: DC | PRN

## 2014-02-20 MED ORDER — MIDAZOLAM HCL 2 MG/2ML IJ SOLN
INTRAMUSCULAR | Status: AC
  Filled 2014-02-20: qty 2

## 2014-02-20 MED ORDER — CHLORHEXIDINE GLUCONATE 4 % EX LIQD: 60.0000 mL | Freq: Once | CUTANEOUS | Status: DC

## 2014-02-20 MED ORDER — LIDOCAINE HCL (PF) 0.5 % IJ SOLN
INTRAMUSCULAR | Status: DC | PRN
  Administered 2014-02-20: 30 mL via INTRAVENOUS

## 2014-02-20 MED ORDER — ONDANSETRON HCL 4 MG/2ML IJ SOLN
INTRAMUSCULAR | Status: DC | PRN
  Administered 2014-02-20: 4 mg via INTRAVENOUS

## 2014-02-20 MED ORDER — PROPOFOL 10 MG/ML IV EMUL
INTRAVENOUS | Status: AC
  Filled 2014-02-20: qty 50

## 2014-02-20 MED ORDER — PROPOFOL INFUSION 10 MG/ML OPTIME
INTRAVENOUS | Status: DC | PRN
  Administered 2014-02-20: 75 ug/kg/min via INTRAVENOUS

## 2014-02-20 MED ORDER — MIDAZOLAM HCL 2 MG/2ML IJ SOLN: 1.0000 mg | INTRAMUSCULAR | Status: DC | PRN

## 2014-02-20 MED ORDER — MIDAZOLAM HCL 5 MG/5ML IJ SOLN
INTRAMUSCULAR | Status: DC | PRN
  Administered 2014-02-20: 1 mg via INTRAVENOUS

## 2014-02-20 MED ORDER — FENTANYL CITRATE 0.05 MG/ML IJ SOLN: 50.0000 ug | INTRAMUSCULAR | Status: DC | PRN

## 2014-02-20 MED ORDER — CEFAZOLIN SODIUM-DEXTROSE 2-3 GM-% IV SOLR
2.0000 g | INTRAVENOUS | Status: DC
Start: 2014-02-20 — End: 2014-02-20

## 2014-02-20 MED ORDER — SODIUM CHLORIDE 0.9 % IV SOLN
1000.0000 mg | INTRAVENOUS | Status: DC | PRN
  Administered 2014-02-20: 1000 mg via INTRAVENOUS

## 2014-02-20 MED ORDER — FENTANYL CITRATE 0.05 MG/ML IJ SOLN
INTRAMUSCULAR | Status: AC
  Filled 2014-02-20: qty 4

## 2014-02-20 MED ORDER — LACTATED RINGERS IV SOLN
INTRAVENOUS | Status: DC
  Administered 2014-02-20: 08:00:00 via INTRAVENOUS

## 2014-02-20 MED ORDER — CEFAZOLIN SODIUM-DEXTROSE 2-3 GM-% IV SOLR
INTRAVENOUS | Status: AC
  Filled 2014-02-20: qty 50

## 2014-02-20 MED ORDER — BUPIVACAINE HCL (PF) 0.25 % IJ SOLN
INTRAMUSCULAR | Status: DC | PRN
  Administered 2014-02-20: 7 mL

## 2014-02-20 MED ORDER — ONDANSETRON HCL 4 MG/2ML IJ SOLN: 4.0000 mg | Freq: Once | INTRAMUSCULAR | Status: DC | PRN

## 2014-02-20 MED ORDER — MIDAZOLAM HCL 2 MG/ML PO SYRP: 12.0000 mg | ORAL_SOLUTION | Freq: Once | ORAL | Status: DC | PRN

## 2014-02-20 MED ORDER — FENTANYL CITRATE 0.05 MG/ML IJ SOLN
INTRAMUSCULAR | Status: DC | PRN
  Administered 2014-02-20: 50 ug via INTRAVENOUS

## 2014-02-20 MED ORDER — VANCOMYCIN HCL IN DEXTROSE 1-5 GM/200ML-% IV SOLN: 1000.0000 mg | INTRAVENOUS | Status: DC

## 2014-02-20 MED ORDER — VANCOMYCIN HCL IN DEXTROSE 1-5 GM/200ML-% IV SOLN
INTRAVENOUS | Status: AC
Start: 2014-02-20 — End: 2014-02-20
  Filled 2014-02-20: qty 200

## 2014-02-20 MED ORDER — LIDOCAINE HCL (CARDIAC) 20 MG/ML IV SOLN
INTRAVENOUS | Status: DC | PRN
  Administered 2014-02-20: 30 mg via INTRAVENOUS

## 2014-02-20 MED ORDER — FENTANYL CITRATE 0.05 MG/ML IJ SOLN: 25.0000 ug | INTRAMUSCULAR | Status: DC | PRN

## 2014-02-20 SURGICAL SUPPLY — 35 items
BANDAGE COBAN STERILE 2 (GAUZE/BANDAGES/DRESSINGS) ×3 IMPLANT
BLADE 15 SAFETY STRL DISP (BLADE) ×3 IMPLANT
BLADE MINI RND TIP GREEN BEAV (BLADE) ×3 IMPLANT
BNDG CMPR 9X4 STRL LF SNTH (GAUZE/BANDAGES/DRESSINGS)
BNDG ESMARK 4X9 LF (GAUZE/BANDAGES/DRESSINGS) IMPLANT
CHLORAPREP W/TINT 26ML (MISCELLANEOUS) ×3 IMPLANT
CORDS BIPOLAR (ELECTRODE) IMPLANT
COVER MAYO STAND STRL (DRAPES) ×3 IMPLANT
COVER TABLE BACK 60X90 (DRAPES) ×3 IMPLANT
CUFF TOURNIQUET SINGLE 18IN (TOURNIQUET CUFF) ×2 IMPLANT
DECANTER SPIKE VIAL GLASS SM (MISCELLANEOUS) IMPLANT
DRAPE EXTREMITY T 121X128X90 (DRAPE) ×3 IMPLANT
DRAPE SURG 17X23 STRL (DRAPES) ×3 IMPLANT
GAUZE SPONGE 4X4 12PLY STRL (GAUZE/BANDAGES/DRESSINGS) ×3 IMPLANT
GAUZE XEROFORM 1X8 LF (GAUZE/BANDAGES/DRESSINGS) ×3 IMPLANT
GLOVE BIOGEL PI IND STRL 7.0 (GLOVE) IMPLANT
GLOVE BIOGEL PI IND STRL 8.5 (GLOVE) ×1 IMPLANT
GLOVE BIOGEL PI INDICATOR 7.0 (GLOVE) ×2
GLOVE BIOGEL PI INDICATOR 8.5 (GLOVE) ×2
GLOVE ECLIPSE 6.5 STRL STRAW (GLOVE) ×2 IMPLANT
GLOVE SURG ORTHO 8.0 STRL STRW (GLOVE) ×3 IMPLANT
GOWN STRL REUS W/ TWL LRG LVL3 (GOWN DISPOSABLE) ×1 IMPLANT
GOWN STRL REUS W/TWL LRG LVL3 (GOWN DISPOSABLE) ×3
GOWN STRL REUS W/TWL XL LVL3 (GOWN DISPOSABLE) ×3 IMPLANT
NEEDLE 27GAX1X1/2 (NEEDLE) ×3 IMPLANT
NS IRRIG 1000ML POUR BTL (IV SOLUTION) ×3 IMPLANT
PACK BASIN DAY SURGERY FS (CUSTOM PROCEDURE TRAY) ×3 IMPLANT
PADDING CAST ABS 4INX4YD NS (CAST SUPPLIES) ×2
PADDING CAST ABS COTTON 4X4 ST (CAST SUPPLIES) ×1 IMPLANT
STOCKINETTE 4X48 STRL (DRAPES) ×3 IMPLANT
SUT VICRYL RAPIDE 4/0 PS 2 (SUTURE) ×3 IMPLANT
SYR BULB 3OZ (MISCELLANEOUS) ×3 IMPLANT
SYR CONTROL 10ML LL (SYRINGE) ×3 IMPLANT
TOWEL OR 17X24 6PK STRL BLUE (TOWEL DISPOSABLE) ×6 IMPLANT
UNDERPAD 30X30 INCONTINENT (UNDERPADS AND DIAPERS) ×3 IMPLANT

## 2014-02-20 NOTE — Transfer of Care (Signed)
Immediate Anesthesia Transfer of Care Note  Patient: Sandra Henry  Procedure(s) Performed: Procedure(s): RELEASE A-1 PULLEY RIGHT INDEX,  MIDDLE AND RING FINGERS  (Right)  Patient Location: PACU  Anesthesia Type:Bier block  Level of Consciousness: awake, alert , oriented and patient cooperative  Airway & Oxygen Therapy: Patient Spontanous Breathing  Post-op Assessment: Report given to PACU RN and Post -op Vital signs reviewed and stable  Post vital signs: Reviewed and stable  Complications: No apparent anesthesia complications

## 2014-02-20 NOTE — Anesthesia Procedure Notes (Addendum)
Procedure Name: MAC Date/Time: 02/20/2014 8:35 AM Performed by: Samaira Holzworth Pre-anesthesia Checklist: Patient identified, Emergency Drugs available, Suction available, Patient being monitored and Timeout performed Patient Re-evaluated:Patient Re-evaluated prior to inductionOxygen Delivery Method: Simple face mask   Anesthesia Regional Block:  Bier block (IV Regional)  Pre-Anesthetic Checklist: ,, timeout performed, Correct Patient, Correct Site, Correct Laterality, Correct Procedure,, site marked, surgical consent,, at surgeon's request Needles:  Injection technique: Single-shot  Needle Type: Other      Needle Gauge: 20 and 20 G    Additional Needles: Bier block (IV Regional) Narrative:   Performed by: Personally

## 2014-02-20 NOTE — Brief Op Note (Signed)
02/20/2014  9:21 AM  PATIENT:  Sandra Henry  68 y.o. female  PRE-OPERATIVE DIAGNOSIS:  STENOSING TENOSYNOVITIS RIGHT INDEX, MIDDLE AND RING FINGER   POST-OPERATIVE DIAGNOSIS:  STENOSING TENOSYNOVITIS RIGHT INDEX, MIDDLE AND RING FINGER   PROCEDURE:  Procedure(s): RELEASE A-1 PULLEY RIGHT INDEX,  MIDDLE AND RING FINGERS  (Right)  SURGEON:  Surgeon(s) and Role:    * Nicki ReaperGary R Jushua Waltman, MD - Primary  PHYSICIAN ASSISTANT:   ASSISTANTS: none   ANESTHESIA:   local and regional  EBL:  Total I/O In: 600 [I.V.:600] Out: -   BLOOD ADMINISTERED:none  DRAINS: none   LOCAL MEDICATIONS USED:  BUPIVICAINE   SPECIMEN:  No Specimen  DISPOSITION OF SPECIMEN:  N/A  COUNTS:  YES  TOURNIQUET:   Total Tourniquet Time Documented: Forearm (Right) - 26 minutes Total: Forearm (Right) - 26 minutes   DICTATION: .Other Dictation: Dictation Number [id number]  PLAN OF CARE: Discharge to home after PACU  PATIENT DISPOSITION:  PACU - hemodynamically stable.

## 2014-02-20 NOTE — Op Note (Signed)
Dictation Number [id number]

## 2014-02-20 NOTE — Discharge Instructions (Addendum)

## 2014-02-20 NOTE — Anesthesia Postprocedure Evaluation (Signed)
  Anesthesia Post-op Note  Patient: Sandra BattyRebecca S Cullin  Procedure(s) Performed: Procedure(s): RELEASE A-1 PULLEY RIGHT INDEX,  MIDDLE AND RING FINGERS  (Right)  Patient Location: PACU  Anesthesia Type:MAC and Bier block  Level of Consciousness: awake, alert  and oriented  Airway and Oxygen Therapy: Patient Spontanous Breathing  Post-op Pain: none  Post-op Assessment: Post-op Vital signs reviewed  Post-op Vital Signs: Reviewed  Last Vitals:  Filed Vitals:   02/20/14 1010  BP: 137/70  Pulse: 51  Temp: 36.6 C  Resp: 16    Complications: No apparent anesthesia complications

## 2014-02-20 NOTE — H&P (Signed)
[name] is a 68 year old left hand dominant with triggering primarily of her right ring finger and to a lesser extent her index and middle. She states this has been going on for approximately 6-8 weeks. She has no history of injury to it. She had a trigger thumb on her left side treated multiple years ago with one injection followed by surgery by [place] in [place]. She has no history of diabetes or thyroid problems. She does have a history of arthritis. She has no history of gout. There is a family history of rheumatoid arthritis. She complains of an intermittent moderate to severe throbbing and aching pain primarily on the ring finger. She has been taking ibuprofen. She was given a short course of steroids which gave her some relief initially but has worn off as the Dosepak progressed. She has no numbness or tingling.The ring finger has been injected on two occasions without relief of symptoms. She also has discomfort with her index and middle right hand with the three fingers involved we would recommend release of each  PAST MEDICAL HISTORY: She is allergic to sulfa, PCN, and codeine. She is on HCTZ, Timolol eye drops and Crestor.  She has had laparoscopic tubal ligation.  FAMILY H ISTORY: Positive for heart disease and arthritis.  SOCIAL HISTORY: She does not smoke or drink. 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  REVIEW OF SYSTEMS: Positive for glasses, contacts, high BP, and Hepatitis, otherwise negative for 14 points.  Hubbard RobinsonRebecca S Henry is an 68 y.o. female.   Chief Complaint: STS rt index, middle and ring fingers HPI: see above  Past Medical History  Diagnosis Date  . Hypertension     under control with med., has been on med. x 4 yr.  . Glaucoma   . Dental bridge present     upper  . Dental crowns present   . Seasonal allergies   . 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 Tubal ligation      laparoscopic  . Cataract extraction w/ intraocular lens implant Bilateral     History reviewed. No pertinent family history. Social History:  reports that she has never smoked. She has never used smokeless tobacco. She reports that she does not drink alcohol or use illicit drugs.  Allergies:  Allergies  Allergen Reactions  . Codeine Nausea Only  . Penicillins Rash    AS A CHILD  . Sulfa Antibiotics Rash    Medications Prior to Admission  Medication Sig Dispense Refill  . Ascorbic Acid (VITAMIN C) 1000 MG tablet Take 1,000 mg by mouth daily.      Marland Kitchen. aspirin 81 MG tablet Take 81 mg by mouth daily.      . calcium citrate-vitamin D (CITRACAL+D) 315-200 MG-UNIT per tablet Take 1 tablet by mouth 2 (two) times daily.      . cholecalciferol (VITAMIN D) 400 UNITS TABS tablet Take 400 Units by mouth 2 (two) times daily.      . Garlic 10 MG CAPS Take 1 capsule by mouth daily.      . Ginger, Zingiber officinalis, (GINGER ROOT PO) Take 1 tablet by mouth daily.      . Glucosamine-Chondroitin (GLUCOSAMINE CHONDR COMPLEX) 500-400 MG CAPS Take 1 tablet by mouth 2 (two) times daily. 1500 - 200      . hydrochlorothiazide (MICROZIDE) 12.5 MG capsule Take  12.5 mg by mouth 2 (two) times daily.      . Multiple Vitamin (MULTIVITAMIN) tablet Take 1 tablet by mouth daily.      . Omega-3 Fatty Acids (FISH OIL) 1000 MG CAPS Take by mouth daily.      . rosuvastatin (CRESTOR) 10 MG tablet Take 10 mg by mouth 3 (three) times a week.      . timolol (BETIMOL) 0.25 % ophthalmic solution Place 1 drop into both eyes 2 (two) times daily.        No results found for this or any previous visit (from the past 48 hour(s)).  No results found.   Pertinent items are noted in HPI.  Blood pressure 183/83, pulse 64, temperature 97.4 F (36.3 C), temperature source Oral, resp. rate 83, height 5\' 4"  (1.626 m), weight 127 lb (57.607 kg), SpO2 16.00%.  General appearance:  alert, cooperative and appears stated age Head: Normocephalic, without obvious abnormality Neck: no JVD Resp: clear to auscultation bilaterally Cardio: regular rate and rhythm, S1, S2 normal, no murmur, click, rub or gallop GI: soft, non-tender; bowel sounds normal; no masses,  no organomegaly Extremities: extremities normal, atraumatic, no cyanosis or edema Pulses: 2+ and symmetric Skin: Skin color, texture, turgor normal. No rashes or lesions Neurologic: Grossly normal Incision/Wound: na  Assessment/Plan Dx: STS right index middle and ring fingers We have discussed risks and complications.  She is aware there is no guarantee with the surgery, the possibility of infection, recurrence, injury to arteries, nerves, tendons, incomplete relief of symptoms and dystrophy.  She would like to proceed to have this done.  She is scheduled for release A-1 pulley right index, middle and ring fingers as an outpatient under regional anesthesia  Sandra ReaperGary R Ezma Henry 02/20/2014, 7:47 AM

## 2014-02-20 NOTE — Anesthesia Preprocedure Evaluation (Signed)
Anesthesia Evaluation  Patient identified by MRN, date of birth, ID band Patient awake    Reviewed: Allergy & Precautions, H&P , NPO status , Patient's Chart, lab work & pertinent test results  Airway Mallampati: I TM Distance: >3 FB Neck ROM: Full    Dental  (+) Teeth Intact, Dental Advisory Given   Pulmonary  breath sounds clear to auscultation        Cardiovascular hypertension, Pt. on medications Rhythm:Regular Rate:Normal     Neuro/Psych    GI/Hepatic   Endo/Other    Renal/GU      Musculoskeletal   Abdominal   Peds  Hematology   Anesthesia Other Findings   Reproductive/Obstetrics                           Anesthesia Physical Anesthesia Plan  ASA: II  Anesthesia Plan: MAC and Bier Block   Post-op Pain Management:    Induction: Intravenous  Airway Management Planned: Simple Face Mask  Additional Equipment:   Intra-op Plan:   Post-operative Plan:   Informed Consent: I have reviewed the patients History and Physical, chart, labs and discussed the procedure including the risks, benefits and alternatives for the proposed anesthesia with the patient or authorized representative who has indicated his/her understanding and acceptance.   Dental advisory given  Plan Discussed with: CRNA, Anesthesiologist and Surgeon  Anesthesia Plan Comments:         Anesthesia Quick Evaluation  

## 2014-02-21 NOTE — Op Note (Signed)
NAMEverrett Coombe:  Sandra Henry, Sandra Henry             ACCOUNT NO.:  [id number]  MEDICAL RECORD NO.:  [id number]  LOCATION:                                 FACILITY:  PHYSICIAN:  Cindee SaltGary Chandan Fly, M.D.            DATE OF BIRTH:  DATE OF PROCEDURE:  02/20/2014 DATE OF DISCHARGE:                              OPERATIVE REPORT   PREOPERATIVE DIAGNOSIS:  Stenosing tenosynovitis, right index, right middle, right ring fingers.  POSTOPERATIVE DIAGNOSIS:  Stenosing tenosynovitis, right index, right middle, right ring fingers.  OPERATION:  Release of A1 pulley with partial tenosynovectomy, flexor tendons, right index, right middle, right ring fingers.  SURGEON:  Cindee SaltGary Kobe Jansma, MD  ANESTHESIA:  Forearm-based IV regional with local infiltration.  ANESTHESIOLOGIST:  Sheldon Silvanavid Crews, M.D.  HISTORY:  The patient is a 68 year old female with a history of triggering of multiple digits, not responded to multiple injections. She is admitted now for release of the index, middle, and ring fingers of her right hand.  Pre, peri, postoperative course have been discussed along with risks and complications.  She is aware that there is no guarantee with the surgery, possibility of infection, recurrence of injury to arteries, nerves, tendons, incomplete relief of symptoms, dystrophy.  In the preoperative area, the patient is seen, the extremity marked by both patient and surgeon.  Antibiotic given.  PROCEDURE IN DETAIL:  The patient was brought to the operating room, where a forearm-based IV regional anesthetic was carried out without difficulty.  She was prepped using ChloraPrep, supine position, right arm free.  A 3-minute dry time was allowed.  Time-out taken, confirming the patient and procedure.  A each finger was addressed separately with oblique incisions.  The incision was made, carried down through subcutaneous tissue.  Bleeders electrocauterized and retractors placed protecting neurovascular structures.  The A1 pulley was  found to be markedly thickened with a very significant tenosynovitis proximally. The A1 pulley was released on its radial aspect.  A small incision made centrally in A2.  Partial synovectomy performed proximally with separation of the tendons.  A separate incision was then made over the middle finger, carried down through subcutaneous tissue similar to the index finger.  Retractors placed, protecting the neurovascular structures.  The A1 pulley released on its radial aspect.  A small incision made centrally in A2.  Again, a very significant tenosynovitis was present proximally.  This was partially debrided.  The tendons separated.  A separate incision was then made on the ring finger obliquely, carried down through subcutaneous tissue.  Again retractors placed, protecting the neurovascular structures.  The A1 pulley was released on its radial aspect.  A small incision made centrally in A2. A very significant tenosynovitis was present proximally.  This was partially debrided and the tendons separated.  The wounds were copiously irrigated with saline.  The skin then closed with interrupted 4-0 Vicryl Rapide sutures.  A local infiltration with 0.25% Marcaine without epinephrine was given, approximately 8 mL was used.  Sterile compressive dressing with fingers free was applied.  On deflation of the tourniquet, all fingers immediately pinked.  She was taken to the recovery room for observation in satisfactory condition.  She will be discharged home to [facility] on [facility].          ______________________________ Cindee SaltGary Aksel Bencomo, M.D.     GK/MEDQ  D:  02/20/2014  T:  02/21/2014  Job:  [id number]

## 2014-02-22 ENCOUNTER — Encounter (HOSPITAL_BASED_OUTPATIENT_CLINIC_OR_DEPARTMENT_OTHER): Payer: Self-pay | Admitting: Orthopedic Surgery

## 2014-02-23 DIAGNOSIS — M19049 Primary osteoarthritis, unspecified hand: Secondary | ICD-10-CM | POA: Diagnosis not present

## 2014-02-23 DIAGNOSIS — M6530 Trigger finger, unspecified finger: Secondary | ICD-10-CM | POA: Diagnosis not present

## 2014-02-27 DIAGNOSIS — M6530 Trigger finger, unspecified finger: Secondary | ICD-10-CM | POA: Diagnosis not present

## 2014-02-27 DIAGNOSIS — M19049 Primary osteoarthritis, unspecified hand: Secondary | ICD-10-CM | POA: Diagnosis not present

## 2014-03-02 DIAGNOSIS — M6530 Trigger finger, unspecified finger: Secondary | ICD-10-CM | POA: Diagnosis not present

## 2014-03-02 DIAGNOSIS — M19049 Primary osteoarthritis, unspecified hand: Secondary | ICD-10-CM | POA: Diagnosis not present

## 2014-03-08 ENCOUNTER — Ambulatory Visit: Payer: 59

## 2014-03-08 ENCOUNTER — Ambulatory Visit
Admission: RE | Admit: 2014-03-08 | Discharge: 2014-03-08 | Disposition: A | Discharge status: Home / Self Care | Payer: Medicare Other | Source: Ambulatory Visit

## 2014-03-08 DIAGNOSIS — Z1231 Encounter for screening mammogram for malignant neoplasm of breast: Secondary | ICD-10-CM

## 2014-03-09 DIAGNOSIS — M6530 Trigger finger, unspecified finger: Secondary | ICD-10-CM | POA: Diagnosis not present

## 2014-03-15 DIAGNOSIS — M6530 Trigger finger, unspecified finger: Secondary | ICD-10-CM | POA: Diagnosis not present

## 2014-03-22 DIAGNOSIS — M6530 Trigger finger, unspecified finger: Secondary | ICD-10-CM | POA: Diagnosis not present

## 2014-03-22 DIAGNOSIS — H4011X0 Primary open-angle glaucoma, stage unspecified: Secondary | ICD-10-CM | POA: Diagnosis not present

## 2014-03-22 DIAGNOSIS — H04129 Dry eye syndrome of unspecified lacrimal gland: Secondary | ICD-10-CM | POA: Diagnosis not present

## 2014-03-22 DIAGNOSIS — H53439 Sector or arcuate defects, unspecified eye: Secondary | ICD-10-CM | POA: Diagnosis not present

## 2014-03-28 DIAGNOSIS — M6530 Trigger finger, unspecified finger: Secondary | ICD-10-CM | POA: Diagnosis not present

## 2014-04-05 DIAGNOSIS — M6530 Trigger finger, unspecified finger: Secondary | ICD-10-CM | POA: Diagnosis not present

## 2014-04-16 ENCOUNTER — Other Ambulatory Visit (HOSPITAL_COMMUNITY): Payer: Self-pay | Admitting: Family Medicine

## 2014-04-16 DIAGNOSIS — R609 Edema, unspecified: Secondary | ICD-10-CM | POA: Diagnosis not present

## 2014-04-16 DIAGNOSIS — I7090 Unspecified atherosclerosis: Secondary | ICD-10-CM

## 2014-04-16 DIAGNOSIS — I10 Essential (primary) hypertension: Secondary | ICD-10-CM | POA: Diagnosis not present

## 2014-04-16 DIAGNOSIS — R5381 Other malaise: Secondary | ICD-10-CM | POA: Diagnosis not present

## 2014-04-16 DIAGNOSIS — I499 Cardiac arrhythmia, unspecified: Secondary | ICD-10-CM | POA: Diagnosis not present

## 2014-04-17 ENCOUNTER — Ambulatory Visit (HOSPITAL_COMMUNITY)
Admission: RE | Admit: 2014-04-17 | Discharge: 2014-04-17 | Disposition: A | Discharge status: Home / Self Care | Payer: Medicare Other | Source: Ambulatory Visit | Attending: Family Medicine | Admitting: Family Medicine

## 2014-04-17 DIAGNOSIS — I6529 Occlusion and stenosis of unspecified carotid artery: Secondary | ICD-10-CM | POA: Insufficient documentation

## 2014-04-17 DIAGNOSIS — R5381 Other malaise: Secondary | ICD-10-CM | POA: Diagnosis not present

## 2014-04-17 DIAGNOSIS — I7090 Unspecified atherosclerosis: Secondary | ICD-10-CM

## 2014-04-17 DIAGNOSIS — I499 Cardiac arrhythmia, unspecified: Secondary | ICD-10-CM | POA: Diagnosis not present

## 2014-04-17 DIAGNOSIS — I658 Occlusion and stenosis of other precerebral arteries: Secondary | ICD-10-CM | POA: Diagnosis not present

## 2014-04-17 DIAGNOSIS — I10 Essential (primary) hypertension: Secondary | ICD-10-CM | POA: Diagnosis not present

## 2014-04-17 DIAGNOSIS — R609 Edema, unspecified: Secondary | ICD-10-CM | POA: Diagnosis not present

## 2014-04-23 DIAGNOSIS — E7800 Pure hypercholesterolemia, unspecified: Secondary | ICD-10-CM | POA: Diagnosis not present

## 2014-04-23 DIAGNOSIS — I10 Essential (primary) hypertension: Secondary | ICD-10-CM | POA: Diagnosis not present

## 2014-04-23 DIAGNOSIS — I6529 Occlusion and stenosis of unspecified carotid artery: Secondary | ICD-10-CM | POA: Diagnosis not present

## 2014-04-24 DIAGNOSIS — R002 Palpitations: Secondary | ICD-10-CM | POA: Diagnosis not present

## 2014-04-24 DIAGNOSIS — I10 Essential (primary) hypertension: Secondary | ICD-10-CM | POA: Diagnosis not present

## 2014-04-24 DIAGNOSIS — E7800 Pure hypercholesterolemia, unspecified: Secondary | ICD-10-CM | POA: Diagnosis not present

## 2014-04-24 DIAGNOSIS — I6529 Occlusion and stenosis of unspecified carotid artery: Secondary | ICD-10-CM | POA: Diagnosis not present

## 2014-04-30 DIAGNOSIS — I10 Essential (primary) hypertension: Secondary | ICD-10-CM | POA: Diagnosis not present

## 2014-04-30 DIAGNOSIS — R609 Edema, unspecified: Secondary | ICD-10-CM | POA: Diagnosis not present

## 2014-04-30 DIAGNOSIS — E7800 Pure hypercholesterolemia, unspecified: Secondary | ICD-10-CM | POA: Diagnosis not present

## 2014-05-14 DIAGNOSIS — I10 Essential (primary) hypertension: Secondary | ICD-10-CM | POA: Diagnosis not present

## 2014-05-17 DIAGNOSIS — H4011X0 Primary open-angle glaucoma, stage unspecified: Secondary | ICD-10-CM | POA: Diagnosis not present

## 2014-05-21 DIAGNOSIS — R609 Edema, unspecified: Secondary | ICD-10-CM | POA: Diagnosis not present

## 2014-05-21 DIAGNOSIS — I10 Essential (primary) hypertension: Secondary | ICD-10-CM | POA: Diagnosis not present

## 2014-05-28 DIAGNOSIS — I10 Essential (primary) hypertension: Secondary | ICD-10-CM | POA: Diagnosis not present

## 2014-06-04 DIAGNOSIS — F411 Generalized anxiety disorder: Secondary | ICD-10-CM | POA: Diagnosis not present

## 2014-06-04 DIAGNOSIS — I10 Essential (primary) hypertension: Secondary | ICD-10-CM | POA: Diagnosis not present

## 2014-07-24 DIAGNOSIS — I10 Essential (primary) hypertension: Secondary | ICD-10-CM | POA: Diagnosis not present

## 2014-07-24 DIAGNOSIS — F419 Anxiety disorder, unspecified: Secondary | ICD-10-CM | POA: Diagnosis not present

## 2014-07-24 DIAGNOSIS — G44219 Episodic tension-type headache, not intractable: Secondary | ICD-10-CM | POA: Diagnosis not present

## 2014-07-25 ENCOUNTER — Encounter: Payer: Self-pay | Admitting: Neurology

## 2014-07-25 ENCOUNTER — Encounter (INDEPENDENT_AMBULATORY_CARE_PROVIDER_SITE_OTHER): Payer: Self-pay

## 2014-07-25 ENCOUNTER — Ambulatory Visit (INDEPENDENT_AMBULATORY_CARE_PROVIDER_SITE_OTHER): Payer: Medicare Other | Admitting: Neurology

## 2014-07-25 VITALS — BP 156/81 | HR 77 | Ht 64.0 in | Wt 128.0 lb

## 2014-07-25 DIAGNOSIS — G43C1 Periodic headache syndromes in child or adult, intractable: Secondary | ICD-10-CM

## 2014-07-25 DIAGNOSIS — G43909 Migraine, unspecified, not intractable, without status migrainosus: Secondary | ICD-10-CM

## 2014-07-25 HISTORY — DX: Migraine, unspecified, not intractable, without status migrainosus: G43.909

## 2014-07-25 LAB — SEDIMENTATION RATE: SED RATE: 4 mm/h (ref 0–40)

## 2014-07-25 NOTE — Progress Notes (Signed)
Reason for visit: Headache  Sandra Henry is a 68 y.o. female  History of present illness:  Sandra Henry is a 68 year old left-handed white female with a history of migraine headaches that is virtually lifelong in nature. The patient indicates that her usual headaches are bitemporal in nature associated with a throbbing sensation. The patient on average will have one headache a week. Within the last 4-6 weeks, the patient indicates that her headaches have become daily in nature. The patient indicates that she does not have photophobia, possibly some minimal phonophobia. The patient has no nausea or vomiting, confusion, or numbness or weakness on the arms or legs or face with the headache. The headaches generally will last about 2 hours, and the patient believes that her current headaches are related to stress. The patient indicates that she is sleeping fairly well. She will take an occasional Motrin for her headache, but generally she is able to work through the headache, and she does not require any medications. She recently had an event of sharp jabbing pains that occurred in the right frontal area. The brief sharp pains were followed by 45 minutes of dizziness. This episode concerned her, and she comes to the office today for an evaluation. The patient indicates that she has never had these types of pain previously.  Past Medical History  Diagnosis Date  . Hypertension     under control with med., has been on med. x 4 yr.  . Glaucoma   . Dental bridge present     upper  . Dental crowns present   . Seasonal allergies   . 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 Migraine 07/25/2014    Past Surgical History  Procedure Laterality Date  . Tubal ligation      laparoscopic  . Cataract extraction w/ intraocular lens implant Bilateral   . Trigger finger release Right 02/20/2014    Procedure: RELEASE A-1 PULLEY RIGHT INDEX,  MIDDLE AND  RING FINGERS ;  Surgeon: Nicki ReaperGary R Kuzma, MD;  Location: [facility];  Service: Orthopedics;  Laterality: Right;    Family History  Problem Relation Age of Onset  . Stroke Mother   . Migraines Mother   . Stroke Father   . Heart attack Brother   . Stroke Sister     Social history:  reports that she has never smoked. She has never used smokeless tobacco. She reports that she does not drink alcohol or use illicit drugs.  Medications:  Current Outpatient Prescriptions on File Prior to Visit  Medication Sig Dispense Refill  . Ascorbic Acid (VITAMIN C) 1000 MG tablet Take 1,000 mg by mouth daily.      Marland Kitchen. aspirin 81 MG tablet Take 81 mg by mouth daily.      . calcium citrate-vitamin D (CITRACAL+D) 315-200 MG-UNIT per tablet Take 1 tablet by mouth 2 (two) times daily.      . cholecalciferol (VITAMIN D) 400 UNITS TABS tablet Take 400 Units by mouth 2 (two) times daily.      . Garlic 10 MG CAPS Take 1 capsule by mouth daily.      . Ginger, Zingiber officinalis, (GINGER ROOT PO) Take 1 tablet by mouth daily.      . Glucosamine-Chondroitin (GLUCOSAMINE CHONDR COMPLEX) 500-400 MG CAPS Take 1 tablet by mouth 2 (two) times daily. 1500 - 200      . hydrochlorothiazide (MICROZIDE) 12.5 MG capsule Take  12.5 mg by mouth daily.       . Multiple Vitamin (MULTIVITAMIN) tablet Take 1 tablet by mouth daily.      . Omega-3 Fatty Acids (FISH OIL) 1000 MG CAPS Take by mouth daily.      . rosuvastatin (CRESTOR) 10 MG tablet Take 10 mg by mouth 3 (three) times a week.      . timolol (BETIMOL) 0.25 % ophthalmic solution Place 1 drop into both eyes 2 (two) times daily.       No current facility-administered medications on file prior to visit.      Allergies  Allergen Reactions  . Codeine Nausea Only  . Penicillins Rash    AS A CHILD  . Sulfa Antibiotics Rash    ROS:  Out of a complete 14 system review of symptoms, the patient complains only of the following symptoms, and all other reviewed  systems are negative.  Palpitations of the heart Joint pain, joint swelling Headache, dizziness Anxiety, decreased energy  Blood pressure 156/81, pulse 77, height 5\' 4"  (1.626 m), weight 128 lb (58.06 kg).  Physical Exam  General: The patient is alert and cooperative at the time of the examination.  Eyes: Pupils are equal, round, and reactive to light. Discs are flat bilaterally.  Neck: The neck is supple, no carotid bruits are noted.  Respiratory: The respiratory examination is clear.  Cardiovascular: The cardiovascular examination reveals a regular rate and rhythm, no obvious murmurs or rubs are noted.  Neuromuscular: Range of movement of the cervical spine is relatively full. The patient has crepitus involving the right temporomandibular joint.  Skin: Extremities are without significant edema.  Neurologic Exam  Mental status: The patient is alert and oriented x 3 at the time of the examination. The patient has apparent normal recent and remote memory, with an apparently normal attention span and concentration ability.  Cranial nerves: Facial symmetry is present. There is good sensation of the face to pinprick and soft touch bilaterally. The strength of the facial muscles and the muscles to head turning and shoulder shrug are normal bilaterally. Speech is well enunciated, no aphasia or dysarthria is noted. Extraocular movements are full. Visual fields are full. The tongue is midline, and the patient has symmetric elevation of the soft palate. No obvious hearing deficits are noted.  Motor: The motor testing reveals 5 over 5 strength of all 4 extremities. Good symmetric motor tone is noted throughout.  Sensory: Sensory testing is intact to pinprick, soft touch, vibration sensation, and position sense on all 4 extremities. No evidence of extinction is noted.  Coordination: Cerebellar testing reveals good finger-nose-finger and heel-to-shin bilaterally.  Gait and station: Gait is  normal. Tandem gait is normal. Romberg is negative. No drift is seen.  Reflexes: Deep tendon reflexes are symmetric and normal bilaterally. Toes are downgoing bilaterally.   Assessment/Plan:  One. Migraine headache  2. 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 The patient recently had an event of sharp jabbing pains that likely represent a migraine headache epiphenomenon, "ice pick pains". The patient will be set up for a sedimentation rate today, and she will have MRI evaluation of the brain. The patient does not wish to have medical therapy for her headaches at this time. She will followup through this office if needed, but she is to contact me if she is not doing well with the headaches.  Marlan Palau MD  07/25/2014 12:18 PM  [facility] [address] [place], [place] [id number]  Phone [phone] Fax [phone]

## 2014-07-25 NOTE — Patient Instructions (Signed)

## 2014-07-26 ENCOUNTER — Ambulatory Visit (HOSPITAL_COMMUNITY)
Admission: RE | Admit: 2014-07-26 | Discharge: 2014-07-26 | Disposition: A | Discharge status: Home / Self Care | Payer: Medicare Other | Source: Ambulatory Visit | Attending: Neurology | Admitting: Neurology

## 2014-07-26 ENCOUNTER — Telehealth: Payer: Self-pay | Admitting: *Deleted

## 2014-07-26 ENCOUNTER — Telehealth: Payer: Self-pay | Admitting: Neurology

## 2014-07-26 DIAGNOSIS — R51 Headache: Secondary | ICD-10-CM | POA: Diagnosis not present

## 2014-07-26 DIAGNOSIS — R42 Dizziness and giddiness: Secondary | ICD-10-CM | POA: Insufficient documentation

## 2014-07-26 DIAGNOSIS — G930 Cerebral cysts: Secondary | ICD-10-CM | POA: Insufficient documentation

## 2014-07-26 DIAGNOSIS — G43C1 Periodic headache syndromes in child or adult, intractable: Secondary | ICD-10-CM

## 2014-07-26 NOTE — Telephone Encounter (Signed)
Called patient and left voice message of normal sedimentation rate, and to call the office back with any questions or concerns.

## 2014-07-26 NOTE — Telephone Encounter (Signed)
I called patient. MRI the brain is relatively unremarkable, nothing of any significant clinical importance. The patient will contact me if she decides that she wishes to undergo medical therapy for her migraine.   MRI brain 07/26/2014:  IMPRESSION: No cause of headache is identified. Normal study with the exception of minimal small vessel change of the deep white matter, fairly typical for age. 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

## 2014-07-31 DIAGNOSIS — G44219 Episodic tension-type headache, not intractable: Secondary | ICD-10-CM | POA: Diagnosis not present

## 2014-07-31 DIAGNOSIS — I10 Essential (primary) hypertension: Secondary | ICD-10-CM | POA: Diagnosis not present

## 2014-07-31 DIAGNOSIS — F419 Anxiety disorder, unspecified: Secondary | ICD-10-CM | POA: Diagnosis not present

## 2014-08-29 ENCOUNTER — Encounter: Payer: Self-pay | Admitting: Neurology

## 2014-09-03 DIAGNOSIS — E780 Pure hypercholesterolemia: Secondary | ICD-10-CM | POA: Diagnosis not present

## 2014-09-03 DIAGNOSIS — I10 Essential (primary) hypertension: Secondary | ICD-10-CM | POA: Diagnosis not present

## 2014-09-03 DIAGNOSIS — Z823 Family history of stroke: Secondary | ICD-10-CM | POA: Diagnosis not present

## 2014-09-04 ENCOUNTER — Encounter: Payer: Self-pay | Admitting: Neurology

## 2014-09-14 DIAGNOSIS — I10 Essential (primary) hypertension: Secondary | ICD-10-CM | POA: Diagnosis not present

## 2014-09-14 DIAGNOSIS — E780 Pure hypercholesterolemia: Secondary | ICD-10-CM | POA: Diagnosis not present

## 2014-11-15 DIAGNOSIS — H4011X3 Primary open-angle glaucoma, severe stage: Secondary | ICD-10-CM | POA: Diagnosis not present

## 2014-11-15 DIAGNOSIS — H04123 Dry eye syndrome of bilateral lacrimal glands: Secondary | ICD-10-CM | POA: Diagnosis not present

## 2014-12-28 DIAGNOSIS — E780 Pure hypercholesterolemia: Secondary | ICD-10-CM | POA: Diagnosis not present

## 2014-12-28 DIAGNOSIS — I10 Essential (primary) hypertension: Secondary | ICD-10-CM | POA: Diagnosis not present

## 2014-12-28 DIAGNOSIS — R5383 Other fatigue: Secondary | ICD-10-CM | POA: Diagnosis not present

## 2014-12-28 DIAGNOSIS — R5381 Other malaise: Secondary | ICD-10-CM | POA: Diagnosis not present

## 2015-01-01 DIAGNOSIS — G44219 Episodic tension-type headache, not intractable: Secondary | ICD-10-CM | POA: Diagnosis not present

## 2015-01-01 DIAGNOSIS — I10 Essential (primary) hypertension: Secondary | ICD-10-CM | POA: Diagnosis not present

## 2015-01-01 DIAGNOSIS — I6529 Occlusion and stenosis of unspecified carotid artery: Secondary | ICD-10-CM | POA: Diagnosis not present

## 2015-01-01 DIAGNOSIS — Z823 Family history of stroke: Secondary | ICD-10-CM | POA: Diagnosis not present

## 2015-01-07 ENCOUNTER — Other Ambulatory Visit (HOSPITAL_COMMUNITY): Payer: Self-pay | Admitting: Family Medicine

## 2015-01-07 DIAGNOSIS — M8580 Other specified disorders of bone density and structure, unspecified site: Secondary | ICD-10-CM

## 2015-01-15 ENCOUNTER — Other Ambulatory Visit (HOSPITAL_COMMUNITY): Payer: PRIVATE HEALTH INSURANCE

## 2015-01-29 ENCOUNTER — Ambulatory Visit (HOSPITAL_COMMUNITY)
Admission: RE | Admit: 2015-01-29 | Discharge: 2015-01-29 | Disposition: A | Discharge status: Home / Self Care | Payer: Medicare Other | Source: Ambulatory Visit | Attending: Family Medicine | Admitting: Family Medicine

## 2015-01-29 DIAGNOSIS — M859 Disorder of bone density and structure, unspecified: Secondary | ICD-10-CM | POA: Diagnosis not present

## 2015-01-29 DIAGNOSIS — M8580 Other specified disorders of bone density and structure, unspecified site: Secondary | ICD-10-CM | POA: Diagnosis not present

## 2015-02-14 DIAGNOSIS — H53433 Sector or arcuate defects, bilateral: Secondary | ICD-10-CM | POA: Diagnosis not present

## 2015-02-14 DIAGNOSIS — H4011X3 Primary open-angle glaucoma, severe stage: Secondary | ICD-10-CM | POA: Diagnosis not present

## 2015-02-14 DIAGNOSIS — H3589 Other specified retinal disorders: Secondary | ICD-10-CM | POA: Diagnosis not present

## 2015-02-27 ENCOUNTER — Other Ambulatory Visit: Payer: Self-pay

## 2015-02-27 DIAGNOSIS — Z1231 Encounter for screening mammogram for malignant neoplasm of breast: Secondary | ICD-10-CM

## 2015-03-12 ENCOUNTER — Other Ambulatory Visit (HOSPITAL_COMMUNITY): Payer: Self-pay | Admitting: Family Medicine

## 2015-03-12 ENCOUNTER — Ambulatory Visit (HOSPITAL_COMMUNITY)
Admission: RE | Admit: 2015-03-12 | Discharge: 2015-03-12 | Disposition: A | Discharge status: Home / Self Care | Payer: No Typology Code available for payment source | Source: Ambulatory Visit | Attending: Family Medicine | Admitting: Family Medicine

## 2015-03-12 DIAGNOSIS — M542 Cervicalgia: Secondary | ICD-10-CM | POA: Diagnosis present

## 2015-03-12 DIAGNOSIS — M5032 Other cervical disc degeneration, mid-cervical region: Secondary | ICD-10-CM | POA: Insufficient documentation

## 2015-03-12 DIAGNOSIS — M47812 Spondylosis without myelopathy or radiculopathy, cervical region: Secondary | ICD-10-CM | POA: Diagnosis not present

## 2015-03-27 ENCOUNTER — Ambulatory Visit
Admission: RE | Admit: 2015-03-27 | Discharge: 2015-03-27 | Disposition: A | Discharge status: Home / Self Care | Payer: Medicare Other | Source: Ambulatory Visit

## 2015-03-27 DIAGNOSIS — Z1289 Encounter for screening for malignant neoplasm of other sites: Secondary | ICD-10-CM | POA: Diagnosis not present

## 2015-03-27 DIAGNOSIS — Z1231 Encounter for screening mammogram for malignant neoplasm of breast: Secondary | ICD-10-CM

## 2015-04-16 ENCOUNTER — Other Ambulatory Visit (HOSPITAL_COMMUNITY): Payer: Self-pay | Admitting: Family Medicine

## 2015-04-16 DIAGNOSIS — I6523 Occlusion and stenosis of bilateral carotid arteries: Secondary | ICD-10-CM

## 2015-04-22 ENCOUNTER — Ambulatory Visit (HOSPITAL_COMMUNITY)
Admission: RE | Admit: 2015-04-22 | Discharge: 2015-04-22 | Disposition: A | Discharge status: Home / Self Care | Payer: Medicare Other | Source: Ambulatory Visit | Attending: Family Medicine | Admitting: Family Medicine

## 2015-04-22 DIAGNOSIS — I10 Essential (primary) hypertension: Secondary | ICD-10-CM | POA: Insufficient documentation

## 2015-04-22 DIAGNOSIS — I6523 Occlusion and stenosis of bilateral carotid arteries: Secondary | ICD-10-CM | POA: Insufficient documentation

## 2015-05-30 DIAGNOSIS — H3589 Other specified retinal disorders: Secondary | ICD-10-CM | POA: Diagnosis not present

## 2015-05-30 DIAGNOSIS — H53433 Sector or arcuate defects, bilateral: Secondary | ICD-10-CM | POA: Diagnosis not present

## 2015-05-30 DIAGNOSIS — H4011X3 Primary open-angle glaucoma, severe stage: Secondary | ICD-10-CM | POA: Diagnosis not present

## 2015-05-30 DIAGNOSIS — H04123 Dry eye syndrome of bilateral lacrimal glands: Secondary | ICD-10-CM | POA: Diagnosis not present

## 2015-07-04 DIAGNOSIS — E780 Pure hypercholesterolemia: Secondary | ICD-10-CM | POA: Diagnosis not present

## 2015-07-04 DIAGNOSIS — I10 Essential (primary) hypertension: Secondary | ICD-10-CM | POA: Diagnosis not present

## 2015-07-09 DIAGNOSIS — I10 Essential (primary) hypertension: Secondary | ICD-10-CM | POA: Diagnosis not present

## 2015-07-09 DIAGNOSIS — I6529 Occlusion and stenosis of unspecified carotid artery: Secondary | ICD-10-CM | POA: Diagnosis not present

## 2015-07-09 DIAGNOSIS — M542 Cervicalgia: Secondary | ICD-10-CM | POA: Diagnosis not present

## 2015-07-09 DIAGNOSIS — E780 Pure hypercholesterolemia: Secondary | ICD-10-CM | POA: Diagnosis not present

## 2015-08-22 DIAGNOSIS — H04123 Dry eye syndrome of bilateral lacrimal glands: Secondary | ICD-10-CM | POA: Diagnosis not present

## 2015-08-22 DIAGNOSIS — H401133 Primary open-angle glaucoma, bilateral, severe stage: Secondary | ICD-10-CM | POA: Diagnosis not present

## 2015-12-26 DIAGNOSIS — H04123 Dry eye syndrome of bilateral lacrimal glands: Secondary | ICD-10-CM | POA: Diagnosis not present

## 2015-12-26 DIAGNOSIS — H401133 Primary open-angle glaucoma, bilateral, severe stage: Secondary | ICD-10-CM | POA: Diagnosis not present

## 2015-12-26 DIAGNOSIS — H53433 Sector or arcuate defects, bilateral: Secondary | ICD-10-CM | POA: Diagnosis not present

## 2016-03-12 ENCOUNTER — Other Ambulatory Visit: Payer: Self-pay | Admitting: Obstetrics & Gynecology

## 2016-03-12 DIAGNOSIS — Z1231 Encounter for screening mammogram for malignant neoplasm of breast: Secondary | ICD-10-CM

## 2016-03-26 DIAGNOSIS — I6529 Occlusion and stenosis of unspecified carotid artery: Secondary | ICD-10-CM | POA: Diagnosis not present

## 2016-03-26 DIAGNOSIS — R5383 Other fatigue: Secondary | ICD-10-CM | POA: Diagnosis not present

## 2016-03-26 DIAGNOSIS — I10 Essential (primary) hypertension: Secondary | ICD-10-CM | POA: Diagnosis not present

## 2016-03-26 DIAGNOSIS — E7800 Pure hypercholesterolemia, unspecified: Secondary | ICD-10-CM | POA: Diagnosis not present

## 2016-04-02 DIAGNOSIS — H04123 Dry eye syndrome of bilateral lacrimal glands: Secondary | ICD-10-CM | POA: Diagnosis not present

## 2016-04-02 DIAGNOSIS — H401133 Primary open-angle glaucoma, bilateral, severe stage: Secondary | ICD-10-CM | POA: Diagnosis not present

## 2016-04-07 DIAGNOSIS — I10 Essential (primary) hypertension: Secondary | ICD-10-CM | POA: Diagnosis not present

## 2016-04-07 DIAGNOSIS — F419 Anxiety disorder, unspecified: Secondary | ICD-10-CM | POA: Diagnosis not present

## 2016-04-07 DIAGNOSIS — E7800 Pure hypercholesterolemia, unspecified: Secondary | ICD-10-CM | POA: Diagnosis not present

## 2016-04-07 DIAGNOSIS — I952 Hypotension due to drugs: Secondary | ICD-10-CM | POA: Diagnosis not present

## 2016-04-09 ENCOUNTER — Ambulatory Visit
Admission: RE | Admit: 2016-04-09 | Discharge: 2016-04-09 | Disposition: A | Discharge status: Home / Self Care | Payer: Medicare Other | Source: Ambulatory Visit | Attending: Obstetrics & Gynecology | Admitting: Obstetrics & Gynecology

## 2016-04-09 DIAGNOSIS — Z1231 Encounter for screening mammogram for malignant neoplasm of breast: Secondary | ICD-10-CM

## 2016-04-13 DIAGNOSIS — I10 Essential (primary) hypertension: Secondary | ICD-10-CM | POA: Diagnosis not present

## 2016-04-15 DIAGNOSIS — N950 Postmenopausal bleeding: Secondary | ICD-10-CM | POA: Diagnosis not present

## 2016-04-20 ENCOUNTER — Other Ambulatory Visit (HOSPITAL_COMMUNITY): Payer: Self-pay | Admitting: Family Medicine

## 2016-04-20 DIAGNOSIS — I6529 Occlusion and stenosis of unspecified carotid artery: Secondary | ICD-10-CM

## 2016-04-24 DIAGNOSIS — Z1283 Encounter for screening for malignant neoplasm of skin: Secondary | ICD-10-CM | POA: Diagnosis not present

## 2016-04-24 DIAGNOSIS — D225 Melanocytic nevi of trunk: Secondary | ICD-10-CM | POA: Diagnosis not present

## 2016-04-24 DIAGNOSIS — X32XXXD Exposure to sunlight, subsequent encounter: Secondary | ICD-10-CM | POA: Diagnosis not present

## 2016-04-24 DIAGNOSIS — L570 Actinic keratosis: Secondary | ICD-10-CM | POA: Diagnosis not present

## 2016-04-27 ENCOUNTER — Other Ambulatory Visit (HOSPITAL_COMMUNITY): Payer: Self-pay | Admitting: Family Medicine

## 2016-04-27 ENCOUNTER — Ambulatory Visit (HOSPITAL_COMMUNITY)
Admission: RE | Admit: 2016-04-27 | Discharge: 2016-04-27 | Disposition: A | Discharge status: Home / Self Care | Payer: Medicare Other | Source: Ambulatory Visit | Attending: Family Medicine | Admitting: Family Medicine

## 2016-04-27 DIAGNOSIS — I6529 Occlusion and stenosis of unspecified carotid artery: Secondary | ICD-10-CM

## 2016-04-27 DIAGNOSIS — R05 Cough: Secondary | ICD-10-CM

## 2016-04-27 DIAGNOSIS — R053 Chronic cough: Secondary | ICD-10-CM

## 2016-04-27 DIAGNOSIS — I6523 Occlusion and stenosis of bilateral carotid arteries: Secondary | ICD-10-CM | POA: Diagnosis not present

## 2016-04-29 DIAGNOSIS — N939 Abnormal uterine and vaginal bleeding, unspecified: Secondary | ICD-10-CM | POA: Diagnosis not present

## 2016-07-12 ENCOUNTER — Emergency Department (HOSPITAL_COMMUNITY): Payer: Medicare Other

## 2016-07-12 ENCOUNTER — Encounter (HOSPITAL_COMMUNITY): Payer: Self-pay | Admitting: Emergency Medicine

## 2016-07-12 ENCOUNTER — Emergency Department (HOSPITAL_COMMUNITY)
Admission: EM | Admit: 2016-07-12 | Discharge: 2016-07-13 | Disposition: A | Discharge status: Home / Self Care | Payer: Medicare Other | Attending: Emergency Medicine | Admitting: Emergency Medicine

## 2016-07-12 DIAGNOSIS — R42 Dizziness and giddiness: Secondary | ICD-10-CM

## 2016-07-12 DIAGNOSIS — I10 Essential (primary) hypertension: Secondary | ICD-10-CM | POA: Diagnosis not present

## 2016-07-12 DIAGNOSIS — N390 Urinary tract infection, site not specified: Secondary | ICD-10-CM | POA: Insufficient documentation

## 2016-07-12 LAB — CBC WITH DIFFERENTIAL/PLATELET
BASOS ABS: 0 10*3/uL (ref 0.0–0.1)
BASOS PCT: 1 %
EOS ABS: 0.2 10*3/uL (ref 0.0–0.7)
EOS PCT: 3 %
HCT: 38.9 % (ref 36.0–46.0)
HEMOGLOBIN: 12.7 g/dL (ref 12.0–15.0)
LYMPHS ABS: 1.9 10*3/uL (ref 0.7–4.0)
Lymphocytes Relative: 25 %
MCH: 31.1 pg (ref 26.0–34.0)
MCHC: 32.6 g/dL (ref 30.0–36.0)
MCV: 95.3 fL (ref 78.0–100.0)
Monocytes Absolute: 0.6 10*3/uL (ref 0.1–1.0)
Monocytes Relative: 8 %
NEUTROS PCT: 63 %
Neutro Abs: 4.8 10*3/uL (ref 1.7–7.7)
PLATELETS: 584 10*3/uL — AB (ref 150–400)
RBC: 4.08 MIL/uL (ref 3.87–5.11)
RDW: 12.3 % (ref 11.5–15.5)
WBC: 7.6 10*3/uL (ref 4.0–10.5)

## 2016-07-12 LAB — URINALYSIS, ROUTINE W REFLEX MICROSCOPIC
BILIRUBIN URINE: NEGATIVE
GLUCOSE, UA: NEGATIVE mg/dL
HGB URINE DIPSTICK: NEGATIVE
KETONES UR: NEGATIVE mg/dL
NITRITE: POSITIVE — AB
PH: 5.5 (ref 5.0–8.0)
Protein, ur: NEGATIVE mg/dL
SPECIFIC GRAVITY, URINE: 1.015 (ref 1.005–1.030)

## 2016-07-12 LAB — COMPREHENSIVE METABOLIC PANEL
ALT: 12 U/L — ABNORMAL LOW (ref 14–54)
ANION GAP: 10 (ref 5–15)
AST: 15 U/L (ref 15–41)
Albumin: 3.4 g/dL — ABNORMAL LOW (ref 3.5–5.0)
Alkaline Phosphatase: 64 U/L (ref 38–126)
BILIRUBIN TOTAL: 0.1 mg/dL — AB (ref 0.3–1.2)
BUN: 22 mg/dL — ABNORMAL HIGH (ref 6–20)
CHLORIDE: 106 mmol/L (ref 101–111)
CO2: 24 mmol/L (ref 22–32)
Calcium: 10.1 mg/dL (ref 8.9–10.3)
Creatinine, Ser: 0.93 mg/dL (ref 0.44–1.00)
Glucose, Bld: 118 mg/dL — ABNORMAL HIGH (ref 65–99)
POTASSIUM: 3.7 mmol/L (ref 3.5–5.1)
Sodium: 140 mmol/L (ref 135–145)
TOTAL PROTEIN: 7.7 g/dL (ref 6.5–8.1)

## 2016-07-12 LAB — URINE MICROSCOPIC-ADD ON

## 2016-07-12 MED ORDER — CEFTRIAXONE SODIUM 1 G IJ SOLR
1.0000 g | Freq: Once | INTRAMUSCULAR | Status: AC
  Administered 2016-07-12: 1 g via INTRAVENOUS
  Filled 2016-07-12: qty 10

## 2016-07-12 MED ORDER — NITROFURANTOIN MONOHYD MACRO 100 MG PO CAPS: 100.0000 mg | ORAL_CAPSULE | Freq: Two times a day (BID) | ORAL | 0 refills | Status: DC

## 2016-07-12 NOTE — ED Notes (Signed)
Patient transported to MRI 

## 2016-07-12 NOTE — ED Provider Notes (Signed)
Emergency Department Provider Note   I have reviewed the triage vital signs and the nursing notes.   HISTORY  Chief Complaint Dizziness   HPI Sandra Henry is a 70 y.o. female with PMH of HTN, HLD, and migraine HA presents to the emergency department for evaluation of personally worsening disequilibrium and slight vertigo sensation. Patient reports symptoms worsening over the last 2-3 days without obvious provocation. When walking she reports "feeling drunk" and feels as if images are shifting when staring at them. No unilateral weakness or numbness. Patient denies any headache or neck stiffness. No chest pain or difficulty breathing. She has been off of her blood pressure medications for the last several days because she felt like it was a little bit low.    Past Medical History:  Diagnosis Date  . Dental bridge present    upper  . Dental crowns present   . Glaucoma   . Hypertension    under control with med., has been on med. x 4 yr.  . Migraine 07/25/2014  . Seasonal allergies   . Stenosing tenosynovitis of finger of right hand 02/2014   index, middle and ring fingers    Patient Active Problem List   Diagnosis Date Noted  . Migraine 07/25/2014  . Cervicalgia 11/19/2011  . Posture abnormality 11/19/2011  . Muscle weakness (generalized) 11/19/2011    Past Surgical History:  Procedure Laterality Date  . CATARACT EXTRACTION W/ INTRAOCULAR LENS IMPLANT Bilateral   . TRIGGER FINGER RELEASE Right 02/20/2014   Procedure: RELEASE A-1 PULLEY RIGHT INDEX,  MIDDLE AND RING FINGERS ;  Surgeon: Nicki Reaper, MD;  Location: [facility];  Service: Orthopedics;  Laterality: Right;  . TUBAL LIGATION     laparoscopic    Current Outpatient Rx  . Order #: [id number] Class: Historical Med  . Order #: [id number] Class: Historical Med  . Order #: [id number] Class: Historical Med  . Order #: [id number] Class: Historical Med  . Order #: [id number] Class: Historical Med  .  Order #: [id number] Class: Historical Med  . Order #: [id number] Class: Historical Med  . Order #: [id number] Class: Historical Med  . Order #: [id number] Class: Historical Med  . Order #: [id number] Class: Historical Med  . Order #: [id number] Class: Historical Med  . Order #: [id number] Class: Print  . Order #: [id number] Class: Historical Med  . Order #: [id number] Class: Historical Med  . Order #: [id number] Class: Historical Med    Allergies Codeine; Penicillins; and Sulfa antibiotics  Family History  Problem Relation Age of Onset  . Stroke Mother   . Migraines Mother   . Stroke Father   . Heart attack Brother   . Stroke Sister     Social History Social History  Substance Use Topics  . Smoking status: Never Smoker  . Smokeless tobacco: Never Used  . Alcohol use No    Review of Systems  Constitutional: No fever/chills Eyes: No visual changes. ENT: No sore throat. Cardiovascular: Denies chest pain. Respiratory: Denies shortness of breath. Gastrointestinal: No abdominal pain.  No nausea, no vomiting.  No diarrhea.  No constipation. Genitourinary: Negative for dysuria. Musculoskeletal: Negative for back pain. Skin: Negative for rash. Neurological: Negative for headaches, focal weakness or numbness. Positive disequilibrium and occasional vertigo.   10-point ROS otherwise negative.  ____________________________________________   PHYSICAL EXAM:  VITAL SIGNS: ED Triage Vitals  Enc Vitals Group     BP 07/12/16 1937 179/74     Pulse Rate 07/12/16 1937 101     Resp 07/12/16 1937 18  Temp 07/12/16 1937 98.1 F (36.7 C)     Temp Source 07/12/16 1937 Oral     SpO2 07/12/16 1937 100 %     Weight --      Height 07/12/16 1937 5\' 4"  (1.626 m)   Constitutional: Alert and oriented. Well appearing and in no acute distress. Eyes: Conjunctivae are normal. PERRL. EOMI.  Head: Atraumatic. Nose: No congestion/rhinnorhea. Mouth/Throat: Mucous membranes are moist.  Oropharynx  non-erythematous. Neck: No stridor. No cervical spine tenderness to palpation. Cardiovascular: Normal rate, regular rhythm. Good peripheral circulation. Grossly normal heart sounds.   Respiratory: Normal respiratory effort.  No retractions. Lungs CTAB. Gastrointestinal: Soft and nontender. No distention.  Musculoskeletal: No lower extremity tenderness nor edema. No gross deformities of extremities. Neurologic:  Normal speech and language. No gross focal neurologic deficits are appreciated. Hesitant gait with some sudden corrections in balance. Normal finger-to-nose testing. Normal CN exam 2-12.  Skin:  Skin is warm, dry and intact. No rash noted. Psychiatric: Mood and affect are normal. Speech and behavior are normal.  ____________________________________________   LABS (all labs ordered are listed, but only abnormal results are displayed)  Labs Reviewed  CBC WITH DIFFERENTIAL/PLATELET - Abnormal; Notable for the following:       Result Value   Platelets 584 (*)    All other components within normal limits  COMPREHENSIVE METABOLIC PANEL - Abnormal; Notable for the following:    Glucose, Bld 118 (*)    BUN 22 (*)    Albumin 3.4 (*)    ALT 12 (*)    Total Bilirubin 0.1 (*)    All other components within normal limits  URINALYSIS, ROUTINE W REFLEX MICROSCOPIC (NOT AT [facility]) - Abnormal; Notable for the following:    APPearance CLOUDY (*)    Nitrite POSITIVE (*)    Leukocytes, UA SMALL (*)    All other components within normal limits  URINE MICROSCOPIC-ADD ON - Abnormal; Notable for the following:    Squamous Epithelial / LPF 0-5 (*)    Bacteria, UA MANY (*)    All other components within normal limits   ____________________________________________  EKG   EKG Interpretation  Date/Time:  Sunday July 12 2016 21:09:58 EDT Ventricular Rate:  89 PR Interval:    QRS Duration: 88 QT Interval:  354 QTC Calculation: 431 R Axis:   43 Text Interpretation:  Sinus rhythm Baseline  wander in lead(s) V6 No STEMI.  Confirmed by LONG MD, JOSHUA [phone]([id number]) on 07/12/2016 9:14:26 PM Also confirmed by LONG MD, JOSHUA [phone]), editor [place]  CCT, BEVERLY ([id number])  on 07/13/2016 6:59:17 AM       ____________________________________________  RADIOLOGY  Mr Brain Wo Contrast  Result Date: 07/12/2016 CLINICAL DATA:  70 y/o F; intermittent dizziness with onset 3 days ago worse when standing up. History of hypertension, glaucoma, and migraine. EXAM: MRI HEAD WITHOUT CONTRAST TECHNIQUE: Multiplanar, multiecho pulse sequences of the brain and surrounding structures were obtained without intravenous contrast. COMPARISON:  07/26/2014 MRI brain. FINDINGS: Brain: No acute infarction, hemorrhage, hydrocephalus, extra-axial collection or mass lesion. Mild parenchymal volume loss. No significant T2 FLAIR signal abnormality. Vascular: Normal flow voids. Skull and upper cervical spine: Normal marrow signal. Sinuses/Orbits: Negative.  Bilateral intra-ocular lens replacement. Other: None. IMPRESSION: No acute abnormality is identified.  Unremarkable MRI brain for age. Electronically Signed   By: Mitzi HansenLance  Furusawa-Stratton M.D.   On: 07/12/2016 22:14    ____________________________________________   PROCEDURES  Procedure(s) performed:   Procedures  None ____________________________________________   INITIAL IMPRESSION /  ASSESSMENT AND PLAN / ED COURSE  Pertinent labs & imaging results that were available during my care of the patient were reviewed by me and considered in my medical decision making (see chart for details).  Patient resents to the emergency department for evaluation of disequilibrium and slight vertigo. Symptoms are not reproducible on exam. Patient does have some significant gait ataxia. Question left upper extremity ataxia on finger to nose testing. Plan for MRI brain to rule out posterior circulation stroke. If negative would consider meclizine and primary care/neurology  follow-up.   10:30 PM MRI brain is negative for acute stroke. The patient does have evidence of a urine infection which may be causing some of her symptoms. Plan for IV ceftriaxone and discharged home with Macrobid. Despite her age she has normal creatinine clearance. No evidence of pyelonephritis on exam. Will follow with PCP and established Neurologist as needed if symptoms persist.   At this time, I do not feel there is any life-threatening condition present. I have reviewed and discussed all results (EKG, imaging, lab, urine as appropriate), exam findings with patient. I have reviewed nursing notes and appropriate previous records.  I feel the patient is safe to be discharged home without further emergent workup. Discussed usual and customary return precautions. Patient and family (if present) verbalize understanding and are comfortable with this plan.  Patient will follow-up with their primary care provider. If they do not have a primary care provider, information for follow-up has been provided to them. All questions have been answered.  ____________________________________________  FINAL CLINICAL IMPRESSION(S) / ED DIAGNOSES  Final diagnoses:  Urinary tract infection without hematuria, site unspecified  Dizziness     MEDICATIONS GIVEN DURING THIS VISIT:  Medications  cefTRIAXone (ROCEPHIN) 1 g in dextrose 5 % 50 mL IVPB (0 g Intravenous Stopped 07/13/16 0039)     NEW OUTPATIENT MEDICATIONS STARTED DURING THIS VISIT:  Discharge Medication List as of 07/12/2016 10:40 PM    START taking these medications   Details  nitrofurantoin, macrocrystal-monohydrate, (MACROBID) 100 MG capsule Take 1 capsule (100 mg total) by mouth 2 (two) times daily., Starting Sun 07/12/2016, Print          Note:  This document was prepared using Dragon voice recognition software and may include unintentional dictation errors.  Alona Bene, MD Emergency Medicine    Maia Plan, MD 07/13/16  [phone]

## 2016-07-12 NOTE — ED Triage Notes (Signed)
Pt. reports intermittent dizziness onset 3 days ago worse when standing up , denies blurred vision , alert and oriented , speech clear/ no facial asymmetry , Equal grips with no arm drift.

## 2016-07-12 NOTE — Discharge Instructions (Signed)

## 2016-07-12 NOTE — ED Notes (Signed)
MRI called for pt, asked about pre-medication.  Spoke to Pt who stated she usually takes an Ativan .5 for procedures.  Spoke to MD who agreed that pt could take her home medication .5 Ativan. Pt took medication.

## 2016-07-14 DIAGNOSIS — Z823 Family history of stroke: Secondary | ICD-10-CM | POA: Diagnosis not present

## 2016-07-14 DIAGNOSIS — R42 Dizziness and giddiness: Secondary | ICD-10-CM | POA: Diagnosis not present

## 2016-07-14 DIAGNOSIS — I10 Essential (primary) hypertension: Secondary | ICD-10-CM | POA: Diagnosis not present

## 2016-07-14 DIAGNOSIS — I6523 Occlusion and stenosis of bilateral carotid arteries: Secondary | ICD-10-CM | POA: Diagnosis not present

## 2016-07-14 DIAGNOSIS — N390 Urinary tract infection, site not specified: Secondary | ICD-10-CM | POA: Diagnosis not present

## 2016-07-17 DIAGNOSIS — Z823 Family history of stroke: Secondary | ICD-10-CM | POA: Diagnosis not present

## 2016-07-17 DIAGNOSIS — R42 Dizziness and giddiness: Secondary | ICD-10-CM | POA: Diagnosis not present

## 2016-07-17 DIAGNOSIS — I10 Essential (primary) hypertension: Secondary | ICD-10-CM | POA: Diagnosis not present

## 2016-07-17 DIAGNOSIS — M25551 Pain in right hip: Secondary | ICD-10-CM | POA: Diagnosis not present

## 2016-07-27 DIAGNOSIS — H04123 Dry eye syndrome of bilateral lacrimal glands: Secondary | ICD-10-CM | POA: Diagnosis not present

## 2016-07-27 DIAGNOSIS — R42 Dizziness and giddiness: Secondary | ICD-10-CM | POA: Diagnosis not present

## 2016-07-27 DIAGNOSIS — H409 Unspecified glaucoma: Secondary | ICD-10-CM | POA: Diagnosis not present

## 2016-07-27 DIAGNOSIS — I10 Essential (primary) hypertension: Secondary | ICD-10-CM | POA: Diagnosis not present

## 2016-07-30 DIAGNOSIS — H53433 Sector or arcuate defects, bilateral: Secondary | ICD-10-CM | POA: Diagnosis not present

## 2016-07-30 DIAGNOSIS — H04123 Dry eye syndrome of bilateral lacrimal glands: Secondary | ICD-10-CM | POA: Diagnosis not present

## 2016-07-30 DIAGNOSIS — H3589 Other specified retinal disorders: Secondary | ICD-10-CM | POA: Diagnosis not present

## 2016-07-30 DIAGNOSIS — H401133 Primary open-angle glaucoma, bilateral, severe stage: Secondary | ICD-10-CM | POA: Diagnosis not present

## 2016-08-17 DIAGNOSIS — R42 Dizziness and giddiness: Secondary | ICD-10-CM | POA: Diagnosis not present

## 2016-08-17 DIAGNOSIS — I10 Essential (primary) hypertension: Secondary | ICD-10-CM | POA: Diagnosis not present

## 2016-08-19 ENCOUNTER — Telehealth: Payer: Self-pay | Admitting: Neurology

## 2016-08-19 DIAGNOSIS — G43109 Migraine with aura, not intractable, without status migrainosus: Secondary | ICD-10-CM | POA: Diagnosis not present

## 2016-08-19 DIAGNOSIS — R42 Dizziness and giddiness: Secondary | ICD-10-CM | POA: Diagnosis not present

## 2016-08-19 NOTE — Telephone Encounter (Signed)
Pt called to advise she saw Dr Cloria SpringWoliki today and he advised he thought she should see Dr Lacretia NicksW before 09/10/16. He thinks she may have migraines. She works for [employer] Look[employer]. Please call to advise on an appt date.

## 2016-08-19 NOTE — Telephone Encounter (Signed)
Returned pt call. Let her know that only available work-in appt at this time is on 11/29. She agreed to keep previously scheduled appt on 11/30. Will call back to check on cancellations.

## 2016-08-28 ENCOUNTER — Ambulatory Visit (INDEPENDENT_AMBULATORY_CARE_PROVIDER_SITE_OTHER): Payer: Medicare Other | Admitting: Neurology

## 2016-08-28 ENCOUNTER — Encounter: Payer: Self-pay | Admitting: Neurology

## 2016-08-28 VITALS — BP 172/81 | HR 78 | Ht 65.0 in | Wt 137.0 lb

## 2016-08-28 DIAGNOSIS — G43C1 Periodic headache syndromes in child or adult, intractable: Secondary | ICD-10-CM | POA: Diagnosis not present

## 2016-08-28 DIAGNOSIS — I6529 Occlusion and stenosis of unspecified carotid artery: Secondary | ICD-10-CM | POA: Diagnosis not present

## 2016-08-28 DIAGNOSIS — R42 Dizziness and giddiness: Secondary | ICD-10-CM | POA: Diagnosis not present

## 2016-08-28 MED ORDER — TOPIRAMATE 25 MG PO TABS: ORAL_TABLET | ORAL | 3 refills | Status: DC

## 2016-08-28 NOTE — Progress Notes (Signed)
Reason for visit: Dizziness  Referring physician: Dr. Rollene FareWolicke  Sandra Henry is a 70 y.o. female  History of present illness:  Sandra Henry is a 70 year old left-handed white female with a history of migraine headaches throughout her life. The patient on average will have 2 or 3 headaches a week. She does not find that the headaches are disabling for her, and she has never opted to go on medications on a daily basis to prevent them. Within the last 6 weeks or so, she has begun having episodes of vertigo unassociated with nausea or vomiting. The patient is having problems with walking during the episodes, she has noted that if she bends over or stoops she may get a pounding sensation in the head. The episodes of vertigo may last up to 30 minutes, then she may get her usual bitemporal and retro-orbital headache. The headaches may last several hours. Over the last week, she has not had any episodes of vertigo. The patient was seen by Dr. Gean QuintWolicke from ENT, and MRI evaluation of the brain was done and was unremarkable. The patient has had a carotid Doppler study done in July 2017 that was unremarkable as well. The patient has not reported any symptoms of numbness, or weakness of the face, arms, or legs. The patient has not had any slurred speech or problems with swallowing or cognitive clouding. The patient may take meclizine for her headaches and vertigo. Both of her parents had migraine headaches. The patient is sent to this office for an evaluation.  Past Medical History:  Diagnosis Date  . Dental bridge present    upper  . Dental crowns present   . Glaucoma   . Hypertension    under control with med., has been on med. x 4 yr.  . Migraine 07/25/2014  . Seasonal allergies   . 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 CATARACT EXTRACTION W/ INTRAOCULAR LENS IMPLANT Bilateral   .  TRIGGER FINGER RELEASE Right 02/20/2014   Procedure: RELEASE A-1 PULLEY RIGHT INDEX,  MIDDLE AND RING FINGERS ;  Surgeon: Nicki ReaperGary R Kuzma, MD;  Location: [facility];  Service: Orthopedics;  Laterality: Right;  . TUBAL LIGATION     laparoscopic    Family History  Problem Relation Age of Onset  . Stroke Mother   . Migraines Mother   . Stroke Father   . Heart attack Brother   . Stroke Sister     Social history:  reports that she has never smoked. She has never used smokeless tobacco. She reports that she does not drink alcohol or use drugs.  Medications:  Prior to Admission medications   Medication Sig Start Date End Date Taking? Authorizing Provider  Ascorbic Acid (VITAMIN C) 1000 MG tablet Take 1,000 mg by mouth daily.   Yes Historical Provider, MD  aspirin 81 MG tablet Take 81 mg by mouth daily.   Yes Historical Provider, MD  calcium citrate-vitamin D (CITRACAL+D) 315-200 MG-UNIT per tablet Take 1 tablet by mouth 2 (two) times daily.   Yes Historical Provider, MD  cholecalciferol (VITAMIN D) 400 UNITS TABS tablet Take 400 Units by mouth 2 (two) times daily.   Yes Historical Provider, MD  Garlic 10 MG CAPS Take 1 capsule by mouth daily.   Yes Historical Provider, MD  Ginger, Zingiber officinalis, (GINGER ROOT PO) Take 1 tablet by mouth daily.  Yes Historical Provider, MD  Glucosamine-Chondroitin (GLUCOSAMINE CHONDR COMPLEX) 500-400 MG CAPS Take 1 tablet by mouth 2 (two) times daily. 1500 - 200   Yes Historical Provider, MD  hydrochlorothiazide (MICROZIDE) 12.5 MG capsule Take 12.5 mg by mouth daily as needed.    Yes Historical Provider, MD  losartan (COZAAR) 50 MG tablet Take 50 mg by mouth 2 (two) times daily.   Yes Historical Provider, MD  loteprednol (LOTEMAX) 0.5 % ophthalmic suspension  07/22/16  Yes Historical Provider, MD  meclizine (ANTIVERT) 25 MG tablet Take 25 mg by mouth 4 (four) times daily as needed for dizziness.   Yes Historical Provider, MD  Multiple Vitamin  (MULTIVITAMIN) tablet Take 1 tablet by mouth daily.   Yes Historical Provider, MD  Omega-3 Fatty Acids (FISH OIL) 1000 MG CAPS Take by mouth daily.   Yes Historical Provider, MD  rosuvastatin (CRESTOR) 10 MG tablet Take 10 mg by mouth 3 (three) times a week.   Yes Historical Provider, MD  timolol (BETIMOL) 0.25 % ophthalmic solution Place 1 drop into both eyes 2 (two) times daily.   Yes Historical Provider, MD      Allergies  Allergen Reactions  . Codeine Nausea Only  . Penicillins Rash    AS A CHILD  . Sulfa Antibiotics Rash    ROS:  Out of a complete 14 system review of symptoms, the patient complains only of the following symptoms, and all other reviewed systems are negative.  Blurred vision  Blood pressure (!) 172/81, pulse 78, height 5\' 5"  (1.651 m), weight 137 lb (62.1 kg).  Physical Exam  General: The patient is alert and cooperative at the time of the examination.  Eyes: Pupils are equal, round, and reactive to light. Discs are flat bilaterally.  Neck: The neck is supple, no carotid bruits are noted.  Respiratory: The respiratory examination is clear.  Cardiovascular: The cardiovascular examination reveals a regular rate and rhythm, no obvious murmurs or rubs are noted.  Skin: Extremities are without significant edema.  Neurologic Exam  Mental status: The patient is alert and oriented x 3 at the time of the examination. The patient has apparent normal recent and remote memory, with an apparently normal attention span and concentration ability.  Cranial nerves: Facial symmetry is present. There is good sensation of the face to pinprick and soft touch bilaterally. The strength of the facial muscles and the muscles to head turning and shoulder shrug are normal bilaterally. Speech is well enunciated, no aphasia or dysarthria is noted. Extraocular movements are full. Visual fields are full. The tongue is midline, and the patient has symmetric elevation of the soft palate.  No obvious hearing deficits are noted.  Motor: The motor testing reveals 5 over 5 strength of all 4 extremities. Good symmetric motor tone is noted throughout.  Sensory: Sensory testing is intact to pinprick, soft touch, vibration sensation, and position sense on all 4 extremities. No evidence of extinction is noted.  Coordination: Cerebellar testing reveals good finger-nose-finger and heel-to-shin bilaterally.  Gait and station: Gait is normal. Tandem gait is normal. Romberg is negative, but is unsteady. No drift is seen.  Reflexes: Deep tendon reflexes are symmetric and normal bilaterally. Toes are downgoing bilaterally.   MRI brain 07/12/16:  IMPRESSION: No acute abnormality is identified.  Unremarkable MRI brain for age.  * MRI scan images were reviewed online. I agree with the written report.   Carotid doppler 04/27/16:  IMPRESSION: 1. 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 No flow  limiting stenosis. Degree of stenosis less than 50% bilaterally. Similar findings noted on prior exam.  2. Vertebral arteries are patent with antegrade flow.    Assessment/Plan:  1. Migraine headache  The patient likely is having vertigo as an aura to her migraine headache. When the episodes of vertigo occur, headache generally will ensue after 30 or 45 minutes. The patient has no definite activating factors for headache other than working on a computer. The patient is not sure that she wants to start a daily medication for her headaches and vertigo. I have given her a prescription for Topamax, she may decide to start the medication in the near future. She will return to this office if needed if she starts the medication.  Marlan Palau. Keith Jonanthan Bolender MD 08/28/2016 10:11 AM  [facility] [address] [place], [place] [id number]  Phone [phone] Fax [phone]

## 2016-08-28 NOTE — Patient Instructions (Signed)
   Topamax (topiramate) is a seizure medication that has an FDA approval for seizures and for migraine headache. Potential side effects of this medication include weight loss, cognitive slowing, tingling in the fingers and toes, and carbonated drinks will taste bad. If any significant side effects are noted on this drug, please contact our office.  

## 2016-09-07 ENCOUNTER — Other Ambulatory Visit (HOSPITAL_COMMUNITY)
Admission: RE | Admit: 2016-09-07 | Discharge: 2016-09-07 | Disposition: A | Discharge status: Home / Self Care | Payer: Medicare Other | Source: Ambulatory Visit | Attending: Family Medicine | Admitting: Family Medicine

## 2016-09-07 DIAGNOSIS — I10 Essential (primary) hypertension: Secondary | ICD-10-CM | POA: Diagnosis not present

## 2016-09-07 DIAGNOSIS — R002 Palpitations: Secondary | ICD-10-CM | POA: Diagnosis not present

## 2016-09-07 DIAGNOSIS — F419 Anxiety disorder, unspecified: Secondary | ICD-10-CM | POA: Diagnosis not present

## 2016-09-07 LAB — CBC WITH DIFFERENTIAL/PLATELET
BASOS ABS: 0 10*3/uL (ref 0.0–0.1)
Basophils Relative: 1 %
EOS ABS: 0.1 10*3/uL (ref 0.0–0.7)
EOS PCT: 2 %
HCT: 39.2 % (ref 36.0–46.0)
HEMOGLOBIN: 13.2 g/dL (ref 12.0–15.0)
LYMPHS ABS: 2.1 10*3/uL (ref 0.7–4.0)
LYMPHS PCT: 35 %
MCH: 31.7 pg (ref 26.0–34.0)
MCHC: 33.7 g/dL (ref 30.0–36.0)
MCV: 94 fL (ref 78.0–100.0)
Monocytes Absolute: 0.5 10*3/uL (ref 0.1–1.0)
Monocytes Relative: 8 %
NEUTROS PCT: 54 %
Neutro Abs: 3.3 10*3/uL (ref 1.7–7.7)
PLATELETS: 332 10*3/uL (ref 150–400)
RBC: 4.17 MIL/uL (ref 3.87–5.11)
RDW: 14.4 % (ref 11.5–15.5)
WBC: 6 10*3/uL (ref 4.0–10.5)

## 2016-09-07 LAB — TSH: TSH: 2.805 u[IU]/mL (ref 0.350–4.500)

## 2016-09-09 ENCOUNTER — Telehealth: Payer: Self-pay | Admitting: Cardiovascular Disease

## 2016-09-09 NOTE — Telephone Encounter (Signed)
[facility] 09[facility]16/16 with Dr Tresa EndoKelly.  [facility] for Dr Landry DykeKelly's schedule on 09/16/16. lp

## 2016-09-10 ENCOUNTER — Emergency Department (HOSPITAL_COMMUNITY)
Admission: EM | Admit: 2016-09-10 | Discharge: 2016-09-10 | Disposition: A | Discharge status: Home / Self Care | Payer: Medicare Other | Attending: Emergency Medicine | Admitting: Emergency Medicine

## 2016-09-10 ENCOUNTER — Other Ambulatory Visit: Payer: Self-pay

## 2016-09-10 ENCOUNTER — Ambulatory Visit: Payer: Medicare Other | Admitting: Neurology

## 2016-09-10 ENCOUNTER — Encounter (HOSPITAL_COMMUNITY): Payer: Self-pay | Admitting: Emergency Medicine

## 2016-09-10 ENCOUNTER — Emergency Department (HOSPITAL_COMMUNITY): Payer: Medicare Other

## 2016-09-10 DIAGNOSIS — I491 Atrial premature depolarization: Secondary | ICD-10-CM | POA: Insufficient documentation

## 2016-09-10 DIAGNOSIS — Z7982 Long term (current) use of aspirin: Secondary | ICD-10-CM | POA: Diagnosis not present

## 2016-09-10 DIAGNOSIS — Z79899 Other long term (current) drug therapy: Secondary | ICD-10-CM | POA: Diagnosis not present

## 2016-09-10 DIAGNOSIS — I10 Essential (primary) hypertension: Secondary | ICD-10-CM | POA: Diagnosis not present

## 2016-09-10 DIAGNOSIS — J9811 Atelectasis: Secondary | ICD-10-CM | POA: Diagnosis not present

## 2016-09-10 DIAGNOSIS — R002 Palpitations: Secondary | ICD-10-CM | POA: Diagnosis present

## 2016-09-10 LAB — CBC WITH DIFFERENTIAL/PLATELET
Basophils Absolute: 0 10*3/uL (ref 0.0–0.1)
Basophils Relative: 0 %
EOS PCT: 2 %
Eosinophils Absolute: 0.2 10*3/uL (ref 0.0–0.7)
HEMATOCRIT: 41 % (ref 36.0–46.0)
Hemoglobin: 13.5 g/dL (ref 12.0–15.0)
LYMPHS ABS: 1.9 10*3/uL (ref 0.7–4.0)
LYMPHS PCT: 27 %
MCH: 30.8 pg (ref 26.0–34.0)
MCHC: 32.9 g/dL (ref 30.0–36.0)
MCV: 93.4 fL (ref 78.0–100.0)
MONO ABS: 0.5 10*3/uL (ref 0.1–1.0)
Monocytes Relative: 6 %
NEUTROS ABS: 4.7 10*3/uL (ref 1.7–7.7)
Neutrophils Relative %: 65 %
PLATELETS: 330 10*3/uL (ref 150–400)
RBC: 4.39 MIL/uL (ref 3.87–5.11)
RDW: 14.7 % (ref 11.5–15.5)
WBC: 7.3 10*3/uL (ref 4.0–10.5)

## 2016-09-10 LAB — TSH: TSH: 3.661 u[IU]/mL (ref 0.350–4.500)

## 2016-09-10 LAB — COMPREHENSIVE METABOLIC PANEL
ALT: 20 U/L (ref 14–54)
AST: 20 U/L (ref 15–41)
Albumin: 3.6 g/dL (ref 3.5–5.0)
Alkaline Phosphatase: 54 U/L (ref 38–126)
Anion gap: 7 (ref 5–15)
BILIRUBIN TOTAL: 0.2 mg/dL — AB (ref 0.3–1.2)
BUN: 28 mg/dL — AB (ref 6–20)
CHLORIDE: 109 mmol/L (ref 101–111)
CO2: 25 mmol/L (ref 22–32)
CREATININE: 0.95 mg/dL (ref 0.44–1.00)
Calcium: 10 mg/dL (ref 8.9–10.3)
GFR, EST NON AFRICAN AMERICAN: 59 mL/min — AB (ref >60)
Glucose, Bld: 93 mg/dL (ref 65–99)
Potassium: 4 mmol/L (ref 3.5–5.1)
Sodium: 141 mmol/L (ref 135–145)
TOTAL PROTEIN: 6.9 g/dL (ref 6.5–8.1)

## 2016-09-10 LAB — I-STAT TROPONIN, ED: Troponin i, poc: 0 ng/mL (ref 0.00–0.08)

## 2016-09-10 MED ORDER — METOPROLOL SUCCINATE ER 25 MG PO TB24: 25.0000 mg | ORAL_TABLET | Freq: Every day | ORAL | 0 refills | Status: DC

## 2016-09-10 MED ORDER — METOPROLOL TARTRATE 5 MG/5ML IV SOLN
5.0000 mg | Freq: Once | INTRAVENOUS | Status: AC
  Administered 2016-09-10: 5 mg via INTRAVENOUS
  Filled 2016-09-10: qty 5

## 2016-09-10 NOTE — ED Provider Notes (Signed)
[facility]-EMER[facility]ENCY DEPT Provider Note   CSN: [id number] Arrival date & time: 09/10/16  0601     History   Chief Complaint Chief Complaint  Patient presents with  . Palpitations    HPI Sandra Henry is a 70 y.o. female.  Pt presents to the ED this morning with palpitations.  She first noticed them on the 27th and went to see her PCP.  She is scheduled to see Dr. Tresa EndoKelly (cards) on 12/6.  She woke up this morning around 0500 with the palpitations and they have continued.  They have never lasted so long in the past.  She did take an asa and an ativan pta.  She did not take her other usual medications.      Past Medical History:  Diagnosis Date  . Dental bridge present    upper  . Dental crowns present   . Glaucoma   . Hypertension    under control with med., has been on med. x 4 yr.  . Migraine 07/25/2014  . Seasonal allergies   . Stenosing tenosynovitis of finger of right hand 02/2014   index, middle and ring fingers    Patient Active Problem List   Diagnosis Date Noted  . Dizziness and giddiness 08/28/2016  . Migraine 07/25/2014  . Cervicalgia 11/19/2011  . Posture abnormality 11/19/2011  . Muscle weakness (generalized) 11/19/2011    Past Surgical History:  Procedure Laterality Date  . CATARACT EXTRACTION W/ INTRAOCULAR LENS IMPLANT Bilateral   . TRIGGER FINGER RELEASE Right 02/20/2014   Procedure: RELEASE A-1 PULLEY RIGHT INDEX,  MIDDLE AND RING FINGERS ;  Surgeon: Nicki ReaperGary R Kuzma, MD;  Location: [facility];  Service: Orthopedics;  Laterality: Right;  . TUBAL LIGATION     laparoscopic    OB History    No data available       Home Medications    Prior to Admission medications   Medication Sig Start Date End Date Taking? Authorizing Provider  Ascorbic Acid (VITAMIN C) 1000 MG tablet Take 1,000 mg by mouth daily.   Yes Historical Provider, MD  aspirin 325 MG tablet Take 325 mg by mouth once.   Yes Historical Provider, MD  aspirin 81 MG tablet  Take 81 mg by mouth daily.   Yes Historical Provider, MD  calcium citrate-vitamin D (CITRACAL+D) 315-200 MG-UNIT per tablet Take 1 tablet by mouth 2 (two) times daily.   Yes Historical Provider, MD  cholecalciferol (VITAMIN D) 400 UNITS TABS tablet Take 400 Units by mouth 2 (two) times daily.   Yes Historical Provider, MD  Garlic 10 MG CAPS Take 1 capsule by mouth daily.   Yes Historical Provider, MD  Ginger, Zingiber officinalis, (GINGER ROOT PO) Take 1 tablet by mouth daily.   Yes Historical Provider, MD  Glucosamine-Chondroitin (GLUCOSAMINE CHONDR COMPLEX) 500-400 MG CAPS Take 1 tablet by mouth 2 (two) times daily. 1500 - 200   Yes Historical Provider, MD  hydrochlorothiazide (MICROZIDE) 12.5 MG capsule Take 12.5 mg by mouth daily as needed.    Yes Historical Provider, MD  LORazepam (ATIVAN) 1 MG tablet Take 0.5 mg by mouth 2 (two) times daily. 09/07/16  Yes Historical Provider, MD  loteprednol (LOTEMAX) 0.5 % ophthalmic suspension Place 1 drop into both eyes daily.  07/22/16  Yes Historical Provider, MD  meclizine (ANTIVERT) 25 MG tablet Take 25 mg by mouth 4 (four) times daily as needed for dizziness.   Yes Historical Provider, MD  Multiple Vitamin (MULTIVITAMIN) tablet Take 1 tablet by  mouth daily.   Yes Historical Provider, MD  Omega-3 Fatty Acids (FISH OIL) 1000 MG CAPS Take by mouth daily.   Yes Historical Provider, MD  rosuvastatin (CRESTOR) 10 MG tablet Take 10 mg by mouth 3 (three) times a week.   Yes Historical Provider, MD  timolol (BETIMOL) 0.25 % ophthalmic solution Place 1 drop into both eyes 2 (two) times daily.   Yes Historical Provider, MD  metoprolol succinate (TOPROL-XL) 25 MG 24 hr tablet Take 1 tablet (25 mg total) by mouth daily. 09/10/16   Jacalyn LefevreJulie Carolos Fecher, MD  topiramate (TOPAMAX) 25 MG tablet One tablet at night for 2 weeks, then take 2 tablets at night 08/28/16   York Spanielharles K Willis, MD    Family History Family History  Problem Relation Age of Onset  . Stroke Mother   .  Migraines Mother   . Stroke Father   . Heart attack Brother   . Stroke Sister     Social History Social History  Substance Use Topics  . Smoking status: Never Smoker  . Smokeless tobacco: Never Used  . Alcohol use No     Allergies   Codeine; Penicillins; and Sulfa antibiotics   Review of Systems Review of Systems  Cardiovascular: Positive for palpitations.  All other systems reviewed and are negative.    Physical Exam Updated Vital Signs There were no vitals taken for this visit.  Physical Exam  Constitutional: She is oriented to person, place, and time. She appears well-developed and well-nourished.  HENT:  Head: Normocephalic and atraumatic.  Right Ear: External ear normal.  Left Ear: External ear normal.  Nose: Nose normal.  Mouth/Throat: Oropharynx is clear and moist.  Eyes: Conjunctivae and EOM are normal. Pupils are equal, round, and reactive to light.  Neck: Normal range of motion. Neck supple.  Cardiovascular: Intact distal pulses.  An irregular rhythm present. Tachycardia present.   Pulmonary/Chest: Effort normal and breath sounds normal.  Abdominal: Soft. Bowel sounds are normal.  Musculoskeletal: Normal range of motion.  Neurological: She is alert and oriented to person, place, and time.  Skin: Skin is warm.  Psychiatric: She has a normal mood and affect. Her behavior is normal. Judgment and thought content normal.  Nursing note and vitals reviewed.    ED Treatments / Results  Labs (all labs ordered are listed, but only abnormal results are displayed) Labs Reviewed  COMPREHENSIVE METABOLIC PANEL - Abnormal; Notable for the following:       Result Value   BUN 28 (*)    Total Bilirubin 0.2 (*)    GFR calc non Af Amer 59 (*)    All other components within normal limits  CBC WITH DIFFERENTIAL/PLATELET  TSH  URINALYSIS, ROUTINE W REFLEX MICROSCOPIC (NOT AT [facility])  I-STAT TROPOININ, ED    EKG  EKG Interpretation  Date/Time:  Thursday September 10 2016 06:07:31 EST Ventricular Rate:  112 PR Interval:  162 QRS Duration: 78 QT Interval:  308 QTC Calculation: 420 R Axis:   32 Text Interpretation:  Sinus tachycardia with Premature atrial complexes Possible Anterior infarct , age undetermined Abnormal ECG Confirmed by [facility] MD, Ashlin Kreps ([id number]) on 09/10/2016 7:07:59 AM       Radiology Dg Chest 2 View  Result Date: 09/10/2016 CLINICAL DATA:  Acute onset of palpitations.  Initial encounter. EXAM: CHEST  2 VIEW COMPARISON:  Chest radiograph from 04/27/2016 FINDINGS: The lungs are well-aerated. Mild peribronchial thickening is seen. Minimal bilateral atelectasis is noted. There is no evidence of focal opacification,  pleural effusion or pneumothorax. The heart is mildly enlarged. No acute osseous abnormalities are seen. IMPRESSION: Mild peribronchial thickening seen. Minimal bilateral atelectasis seen. Mild cardiomegaly. Electronically Signed   By: Roanna Raider M.D.   On: 09/10/2016 06:42    Procedures Procedures (including critical care time)  Medications Ordered in ED Medications  metoprolol (LOPRESSOR) injection 5 mg (5 mg Intravenous Given 09/10/16 0754)     Initial Impression / Assessment and Plan / ED Course  I have reviewed the triage vital signs and the nursing notes.  Pertinent labs & imaging results that were available during my care of the patient were reviewed by me and considered in my medical decision making (see chart for details).  Clinical Course    Pt given 5 mg of lopressor IV and HR has been in NSR without PACs.  Pt d/w Dr. Clifton James (cards) who suggests switching losartan to toprol 25 mg.  Pt knows to keep her appointment with Dr. Tresa Endo next week.  She knows to return if worse.   Final Clinical Impressions(s) / ED Diagnoses   Final diagnoses:  PAC (premature atrial contraction)    New Prescriptions New Prescriptions   METOPROLOL SUCCINATE (TOPROL-XL) 25 MG 24 HR TABLET    Take 1 tablet (25 mg total)  by mouth daily.     Jacalyn Lefevre, MD 09/10/16 [phone]

## 2016-09-10 NOTE — ED Triage Notes (Signed)
Pt. reports palpitations this morning , denies chest pain / respirations unlabored . Pt. took ASA  325 mg and Ativan 1 mg prior to arrival.

## 2016-09-16 ENCOUNTER — Encounter: Payer: Self-pay | Admitting: Cardiovascular Disease

## 2016-09-16 ENCOUNTER — Ambulatory Visit (INDEPENDENT_AMBULATORY_CARE_PROVIDER_SITE_OTHER): Payer: Medicare Other | Admitting: Cardiovascular Disease

## 2016-09-16 VITALS — BP 140/66 | HR 77 | Ht 64.0 in | Wt 137.0 lb

## 2016-09-16 DIAGNOSIS — I471 Supraventricular tachycardia: Secondary | ICD-10-CM

## 2016-09-16 DIAGNOSIS — R002 Palpitations: Secondary | ICD-10-CM

## 2016-09-16 DIAGNOSIS — E785 Hyperlipidemia, unspecified: Secondary | ICD-10-CM | POA: Diagnosis not present

## 2016-09-16 DIAGNOSIS — Z79899 Other long term (current) drug therapy: Secondary | ICD-10-CM

## 2016-09-16 DIAGNOSIS — G43909 Migraine, unspecified, not intractable, without status migrainosus: Secondary | ICD-10-CM

## 2016-09-16 DIAGNOSIS — I10 Essential (primary) hypertension: Secondary | ICD-10-CM

## 2016-09-16 DIAGNOSIS — I6529 Occlusion and stenosis of unspecified carotid artery: Secondary | ICD-10-CM

## 2016-09-16 DIAGNOSIS — R0683 Snoring: Secondary | ICD-10-CM

## 2016-09-16 MED ORDER — METOPROLOL SUCCINATE ER 25 MG PO TB24: 25.0000 mg | ORAL_TABLET | Freq: Two times a day (BID) | ORAL | 6 refills | Status: DC

## 2016-09-16 NOTE — Progress Notes (Signed)
Cardiology Office Note    Date:  09/16/2016   ID:  [name], DOB 08/13/1946, MRN [id number]  PCP:  Robert Bellow, MD  Cardiologist:  Shelva Majestic, MD   New cardiology evaluation:  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  She is referred by Dr. Karie Kirks for evaluation of recent episode of tachycardia.  History of Present Illness:  Sandra Henry is a 70 y.o. female who admits to a history of hypertension for approximately 4 years.  and had been taking losartan 25 mg daily.  She has a history of hyperlipidemia and has been taking Crestor 10 mg 3 times per week.  In October 2017, she developed symptoms of vertigo associated with nausea or vomiting and ultimately was evaluated by Dr. Ivan Croft of ENT, as well as by Dr. Floyde Parkins neurology.  She was diagnosed as having migraines headaches.  She was started on meclizine.  On November 30.  She was awakened from sleep at around 5 AM with palpitations which persisted.  She ultimately presented to the [facility] emergency room where she was found to have tachycardia.  Her ECG reports sinus tachycardia with PACs, but by my review, the EKG shows a at topical low atrial tachycardia, etiology rather than sinus tachycardia.  At that time, she was advised to stop losartan, which had been 25 mg and to initiate Toprol-XL 25 mg daily.  On this therapy, she does note improvement.  However, occasionally she has awakened in the very early morning and has noticed a jittery sensation in her heart rate.  She denies any episodes of chest pain.  She denies any awareness of sleep apnea.  According to her husband, however, she does snore mildly.  She typically goes up one night 1 time per night.  She denies significant nocturia.  She denies excessive daytime sleepiness.  She is now referred for further evaluation of her tachycardia.   Past Medical History:  Diagnosis Date  . Dental bridge  present    upper  . Dental crowns present   . Dizziness and giddiness   . Glaucoma   . Hypercalcemia   . Hypertension    under control with med., has been on med. x 4 yr.  . Migraine 07/25/2014  . Palpitations   . Seasonal allergies   . 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 CATARACT EXTRACTION W/ INTRAOCULAR LENS IMPLANT Bilateral   . TRIGGER FINGER RELEASE Right 02/20/2014   Procedure: RELEASE A-1 PULLEY RIGHT INDEX,  MIDDLE AND RING FINGERS ;  Surgeon: Wynonia Sours, MD;  Location: [facility];  Service: Orthopedics;  Laterality: Right;  . TUBAL LIGATION     laparoscopic    Current Medications: Outpatient Medications Prior to Visit  Medication Sig Dispense Refill  . Ascorbic Acid (VITAMIN C) 1000 MG tablet Take 1,000 mg by mouth daily.    Marland Kitchen aspirin 325 MG tablet Take 325 mg by mouth once.    Marland Kitchen aspirin 81 MG tablet Take 81 mg by mouth daily.    . cholecalciferol (VITAMIN D) 400 UNITS TABS tablet Take 400 Units by mouth 2 (two) times daily.    . Garlic 10 MG CAPS Take 1 capsule by mouth daily.    . Ginger, Zingiber officinalis, (GINGER ROOT PO) Take 1 tablet by mouth  daily.    . Glucosamine-Chondroitin (GLUCOSAMINE CHONDR COMPLEX) 500-400 MG CAPS Take 1 tablet by mouth 2 (two) times daily. 1500 - 200    . hydrochlorothiazide (MICROZIDE) 12.5 MG capsule Take 12.5 mg by mouth daily as needed.     Marland Kitchen LORazepam (ATIVAN) 1 MG tablet Take 0.5 mg by mouth 2 (two) times daily.    Marland Kitchen losartan (COZAAR) 50 MG tablet Take 50 mg by mouth daily.    Marland Kitchen loteprednol (LOTEMAX) 0.5 % ophthalmic suspension Place 1 drop into both eyes daily.     . meclizine (ANTIVERT) 25 MG tablet Take 25 mg by mouth 4 (four) times daily as needed for dizziness.    . Multiple Vitamin (MULTIVITAMIN) tablet Take 1 tablet by mouth daily.    . Omega-3 Fatty Acids (FISH OIL) 1000 MG CAPS Take by mouth  daily.    . rosuvastatin (CRESTOR) 10 MG tablet Take 10 mg by mouth 3 (three) times a week.    . timolol (BETIMOL) 0.25 % ophthalmic solution Place 1 drop into both eyes 2 (two) times daily.    . metoprolol succinate (TOPROL-XL) 25 MG 24 hr tablet Take 1 tablet (25 mg total) by mouth daily. 30 tablet 0  . calcium citrate-vitamin D (CITRACAL+D) 315-200 MG-UNIT per tablet Take 1 tablet by mouth 2 (two) times daily.    Marland Kitchen topiramate (TOPAMAX) 25 MG tablet One tablet at night for 2 weeks, then take 2 tablets at night (Patient not taking: Reported on 09/16/2016) 60 tablet 3   No facility-administered medications prior to visit.      Allergies:   Codeine; Penicillins; and Sulfa antibiotics   Social History   Social History  . Marital status: Married    Spouse name: N/A  . Number of children: N/A  . Years of education: N/A   Occupational History  . 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 Smoking status: Never Smoker  . Smokeless tobacco: Never Used  . Alcohol use No  . Drug use: No  . Sexual activity: Not Asked   Other Topics Concern  . None   Social History Narrative   Lives at home w/ her husband   Left-handed   Caffeine: occasional soft drink        Family History:  The patient's family history includes Heart attack in her brother; Migraines in her mother; Stroke in her father, mother, and sister.   ROS General: Negative; No fevers, chills, or night sweats;  HEENT: Negative; No changes in vision or hearing, sinus congestion, difficulty swallowing Pulmonary: Negative; No cough, wheezing, shortness of breath, hemoptysis Cardiovascular: Negative; No chest pain, presyncope, syncope, palpitations GI: Negative; No nausea, vomiting, diarrhea, or abdominal pain GU: Negative; No dysuria, hematuria, or difficulty voiding Musculoskeletal: Negative; no myalgias, joint pain, or weakness Hematologic/Oncology: Negative; no easy bruising, bleeding Endocrine:  Negative; no heat/cold intolerance; no diabetes Neuro: positive for recent diagnosis of migraine headaches and vertigo Skin: Negative; No rashes or skin lesions Psychiatric: Negative; No behavioral problems, depression Sleep: Negative; No snoring, daytime sleepiness, hypersomnolence, bruxism, restless legs, hypnogognic hallucinations, no cataplexy Other comprehensive 14 point system review is negative.   PHYSICAL EXAM:   VS:  BP 140/66 (BP Location: Left Arm, Patient Position: Sitting, Cuff Size: Normal)   Pulse 77   Ht _0  (1.626 m)   Wt 137 lb (62.1 kg)   BMI 23.52 kg/m     Repeat blood pressure by me was 176/70 supine, 170/72 standing.  Wt  Readings from Last 3 Encounters:  09/16/16 137 lb (62.1 kg)  08/28/16 137 lb (62.1 kg)  07/25/14 128 lb (58.1 kg)    General: Alert, oriented, no distress.  Skin: normal turgor, no rashes, warm and dry HEENT: Normocephalic, atraumatic. Pupils equal round and reactive to light; sclera anicteric; extraocular muscles intact; Fundi bilateral lens implants.  No hemorrhages or exudates. Nose without nasal septal hypertrophy Mouth/Parynx benign; Mallinpatti scale 2/3 Neck: No JVD, no carotid bruits; normal carotid upstroke Lungs: clear to ausculatation and percussion; no wheezing or rales Chest wall: without tenderness to palpitation Heart: PMI not displaced, RRR, s1 s2 normal, 1/6 systolic murmur along the left sternal border and apex; no diastolic murmur, no rubs, gallops, thrills, or heaves Abdomen: soft, nontender; no hepatosplenomehaly, BS+; abdominal aorta nontender and not dilated by palpation. Back: no CVA tenderness Pulses 2+ Musculoskeletal: full range of motion, normal strength, no joint deformities Extremities: no clubbing cyanosis or edema, Homan's sign negative  Neurologic: grossly nonfocal; Cranial nerves grossly wnl Psychologic: Normal mood and affect   Studies/Labs Reviewed:   ECG (independently read by me): Normal sinus  rhythm at 77 bpm.  PR interval 174 ms, QTc interval 432 ms.  I personally reviewed the ECG from 09/10/2016, and in contrast to the report which reads sinus tachycardia with PACs, the patient is in a low atrial ectopic tachycardic rhythm and then developed sinus rhythm after a pause followed by recurrent low atrial rhythm.  There are PACs.  Recent Labs: BMP Latest Ref Rng & Units 09/10/2016 07/12/2016 02/16/2014  Glucose 65 - 99 mg/dL 93 118(H) 101(H)  BUN 6 - 20 mg/dL 28(H) 22(H) 24(H)  Creatinine 0.44 - 1.00 mg/dL 0.95 0.93 0.83  Sodium 135 - 145 mmol/L 141 140 142  Potassium 3.5 - 5.1 mmol/L 4.0 3.7 3.9  Chloride 101 - 111 mmol/L 109 106 101  CO2 22 - 32 mmol/L _0 Calcium 8.9 - 10.3 mg/dL 10.0 10.1 10.3     Hepatic Function Latest Ref Rng & Units 09/10/2016 07/12/2016  Total Protein 6.5 - 8.1 g/dL 6.9 7.7  Albumin 3.5 - 5.0 g/dL 3.6 3.4(L)  AST 15 - 41 U/L 20 15  ALT 14 - 54 U/L 20 12(L)  Alk Phosphatase 38 - 126 U/L 54 64  Total Bilirubin 0.3 - 1.2 mg/dL 0.2(L) 0.1(L)    CBC Latest Ref Rng & Units 09/10/2016 09/07/2016 07/12/2016  WBC 4.0 - 10.5 K/uL 7.3 6.0 7.6  Hemoglobin 12.0 - 15.0 g/dL 13.5 13.2 12.7  Hematocrit 36.0 - 46.0 % 41.0 39.2 38.9  Platelets 150 - 400 K/uL 330 332 584(H)   Lab Results  Component Value Date   MCV 93.4 09/10/2016   MCV 94.0 09/07/2016   MCV 95.3 07/12/2016   Lab Results  Component Value Date   TSH 3.661 09/10/2016   No results found for: HGBA1C   BNP No results found for: BNP  ProBNP No results found for: PROBNP   Lipid Panel  No results found for: CHOL, TRIG, HDL, CHOLHDL, VLDL, LDLCALC, LDLDIRECT   Most recent lipid study obtained in Dr. Vickey Sages office on 03/26/2016:  Total cholesterol 186, HDL 48, triglycerides 107, LDL 117.  RADIOLOGY: Dg Chest 2 View  Result Date: 09/10/2016 CLINICAL DATA:  Acute onset of palpitations.  Initial encounter. EXAM: CHEST  2 VIEW COMPARISON:  Chest radiograph from 04/27/2016  FINDINGS: The lungs are well-aerated. Mild peribronchial thickening is seen. Minimal bilateral atelectasis is noted. There is no evidence of focal opacification,  pleural effusion or pneumothorax. The heart is mildly enlarged. No acute osseous abnormalities are seen. IMPRESSION: Mild peribronchial thickening seen. Minimal bilateral atelectasis seen. Mild cardiomegaly. Electronically Signed   By: Garald Balding M.D.   On: 09/10/2016 06:42     Additional studies/ records that were reviewed today include:  I reviewed the patient's emergency room evaluations.  I have also reviewed Dr. Jannifer Franklin is neurologic evaluation and Dr. Vickey Sages notes.    ASSESSMENT:    1. Heart palpitations   2. Ectopic atrial tachycardia (HCC)   3. Essential hypertension   4. Hyperlipidemia, unspecified hyperlipidemia type   5. Drug therapy   6. Snoring      PLAN:  Ms. Amanat Hackel is a 70 year old female who has a history of hypertension, hyperlipidemia, and recently has developed episodes of palpitations and increased heart rate.  Of note, she has noticed her episodes at approximately 5 AM in the morning, which raises the possibility of obstructive sleep apnea contributing  to her symptomatology.  I reviewed her ECG from her emergency room evaluation and rather than sinus tachycardia.  Her ECG suggests ectopic atrial tachycardia.  Is told to discontinue her losartan and recently has been on Toprol-XL 25 mg daily with some improvement.  However, she continues to note some short episodes of short-lived palpitations in the early morning.  Her blood pressure today is elevated and consistent with stage II hypertension.  I reviewed the recent blood work which was done during her evaluations and in Dr. Vickey Sages office.  I will check a comprehensive metabolic panel, magnesium level, and will recheck a lipid panel in light of her history of hyperlipidemia with LDL cholesterol of 116 when last checked 6 months ago.  Presently, I  am increasing her Toprol-XL and she will take 25 g in the morning and an additional 25 mg prior to going to bed.  She does snore.  She believes her sleep is restorative.  She denies daytime sleepiness.  However, I have made her aware of the potential that sleep apnea may be contributing to her early a.m. symptomatology.  I'm scheduling her for 2-D echo Doppler study to evaluate systolic and diastolic function as well as her soft cardiac murmur.  If her blood pressure does not significantly improve with increased beta blocker therapy ARB therapy will most likely be reinstituted.  She has a prescription for HCTZ for which he takes on an as-needed basis for intermittent ankle swelling.  Her most recent TSH level was normal as was her CBC.  Depending upon laboratory, adjustments to her medical therapy will be made.  She may very well need further titration of lipid lowering therapy.  I will see her in follow-up of her echo Doppler study in 6-8 weeks for follow-up evaluation.   Medication Adjustments/Labs and Tests Ordered: Current medicines are reviewed at length with the patient today.  Concerns regarding medicines are outlined above.  Medication changes, Labs and Tests ordered today are listed in the Patient Instructions below. Patient Instructions  Your physician has requested that you have an echocardiogram. Echocardiography is a painless test that uses sound waves to create images of your heart. It provides your doctor with information about the size and shape of your heart and how well your heart's chambers and valves are working. This procedure takes approximately one hour. There are no restrictions for this procedure. This will be done at the [facility].  Your physician recommends that you return for lab work in: [place].   Marland Kitchen  Your physician has recommended you make the following change in your medication:   1.) the metoprolol has been increased to 25 mg twice a day.   Your physician  recommends that you schedule a follow-up appointment in: 2-3 months.          Signed, Shelva Majestic, MD  09/16/2016 5:43 PM    [facility] [address], [place], [place], [place]  [address] Phone: [phone]

## 2016-09-16 NOTE — Patient Instructions (Addendum)
Your physician has requested that you have an echocardiogram. Echocardiography is a painless test that uses sound waves to create images of your heart. It provides your doctor with information about the size and shape of your heart and how well your heart's chambers and valves are working. This procedure takes approximately one hour. There are no restrictions for this procedure. [facility].  Your physician recommends that you return for lab work in: [place].   .Your physician has recommended you make the following change in your medication:   1.) the metoprolol has been increased to 25 mg twice a day.   Your physician recommends that you schedule a follow-up appointment in: 2-3 months.

## 2016-10-06 ENCOUNTER — Telehealth: Payer: Self-pay | Admitting: Cardiovascular Disease

## 2016-10-06 NOTE — Telephone Encounter (Signed)
New message      Pt c/o medication issue:  1. Name of Medication: lorazepam 2. How are you currently taking this medication (dosage and times per day)?  3. Are you having a reaction (difficulty breathing--STAT)? no 4. What is your medication issue? Pt is due to have an echo this week.  Will it be ok to take her lorazepam prior to echo?  She says it "slows" her heart down and wanted to make sure it was ok to take prior to echo.  Please call

## 2016-10-06 NOTE — Telephone Encounter (Signed)
Returned patient call-made aware she does not need to hold any medication prior to scheduled echo this week.    Pt verbalized understanding.  Advised to call with further questions/concerns.

## 2016-10-08 ENCOUNTER — Other Ambulatory Visit: Payer: Self-pay

## 2016-10-08 ENCOUNTER — Ambulatory Visit (HOSPITAL_COMMUNITY): Payer: Medicare Other | Attending: Cardiovascular Disease

## 2016-10-08 DIAGNOSIS — R002 Palpitations: Secondary | ICD-10-CM | POA: Diagnosis not present

## 2016-10-08 DIAGNOSIS — I351 Nonrheumatic aortic (valve) insufficiency: Secondary | ICD-10-CM | POA: Insufficient documentation

## 2016-10-09 ENCOUNTER — Telehealth: Payer: Self-pay | Admitting: Cardiovascular Disease

## 2016-10-09 NOTE — Telephone Encounter (Signed)
Spoke to patient. Have made her aware Dr. Tresa EndoKelly not in office and this result is pending his review. She was understanding regarding f/u timeframe. She requests call when this is available. Will forward to Dr. Tresa EndoKelly for review.

## 2016-10-09 NOTE — Telephone Encounter (Signed)
Pt called for Echo results from 10/08/16

## 2016-10-14 LAB — LIPID PANEL
Cholesterol: 172 mg/dL (ref <200)
HDL: 39 mg/dL — ABNORMAL LOW (ref >50)
LDL Cholesterol: 101 mg/dL — ABNORMAL HIGH (ref <100)
Total CHOL/HDL Ratio: 4.4 Ratio (ref <5.0)
Triglycerides: 161 mg/dL — ABNORMAL HIGH (ref <150)
VLDL: 32 mg/dL — ABNORMAL HIGH (ref <30)

## 2016-10-14 LAB — COMPREHENSIVE METABOLIC PANEL
ALK PHOS: 46 U/L (ref 33–130)
ALT: 17 U/L (ref 6–29)
AST: 18 U/L (ref 10–35)
Albumin: 4.1 g/dL (ref 3.6–5.1)
BUN: 18 mg/dL (ref 7–25)
CALCIUM: 9.6 mg/dL (ref 8.6–10.4)
CO2: 26 mmol/L (ref 20–31)
Chloride: 107 mmol/L (ref 98–110)
Creat: 0.78 mg/dL (ref 0.60–0.93)
GLUCOSE: 80 mg/dL (ref 65–99)
Potassium: 4.1 mmol/L (ref 3.5–5.3)
Sodium: 140 mmol/L (ref 135–146)
TOTAL PROTEIN: 7.3 g/dL (ref 6.1–8.1)
Total Bilirubin: 0.4 mg/dL (ref 0.2–1.2)

## 2016-10-14 LAB — MAGNESIUM: MAGNESIUM: 2.1 mg/dL (ref 1.5–2.5)

## 2016-10-15 NOTE — Telephone Encounter (Signed)
Result note was sent; nl LV fxn with gr 2 diastolic dysfuntion; mild AR

## 2016-10-16 ENCOUNTER — Telehealth: Payer: Self-pay | Admitting: Cardiovascular Disease

## 2016-10-16 NOTE — Telephone Encounter (Signed)
Pt would like her echo results from last Thursday please. °

## 2016-10-16 NOTE — Telephone Encounter (Signed)
Re: echo findings Phone call goes to VM.  OK per DPR to leave detailed msg. I have done so, w instruction to call if questions.
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
## 2016-11-12 ENCOUNTER — Ambulatory Visit: Payer: Medicare Other | Admitting: Cardiovascular Disease

## 2016-11-12 ENCOUNTER — Ambulatory Visit (INDEPENDENT_AMBULATORY_CARE_PROVIDER_SITE_OTHER): Payer: Medicare Other | Admitting: Cardiovascular Disease

## 2016-11-12 DIAGNOSIS — I471 Supraventricular tachycardia: Secondary | ICD-10-CM

## 2016-11-12 DIAGNOSIS — E785 Hyperlipidemia, unspecified: Secondary | ICD-10-CM

## 2016-11-12 DIAGNOSIS — I10 Essential (primary) hypertension: Secondary | ICD-10-CM

## 2016-11-12 DIAGNOSIS — R002 Palpitations: Secondary | ICD-10-CM

## 2016-11-12 MED ORDER — LOSARTAN POTASSIUM 25 MG PO TABS: ORAL_TABLET | ORAL | 3 refills | Status: DC

## 2016-11-12 NOTE — Progress Notes (Signed)
Cardiology Office Note    Date:  11/14/2016   ID:  Sandra Henry, DOB 22-Jul-1946, MRN [id number]  PCP:  Robert Bellow, MD  Cardiologist:  Shelva Majestic, MD   Follow-up cardiology evaluation  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  She is referred by Dr. Karie Kirks for evaluation of recent episode of tachycardia.  I had seen her for initial evaluation in December 2017, she presents for six-week follow-up evaluation.  History of Present Illness:  Sandra Henry is a 71 y.o. female who admits to a history of hypertension for approximately 4 years.  and had been taking losartan 25 mg daily.  She has a history of hyperlipidemia and has been taking Crestor 10 mg 3 times per week.  In October 2017, she developed symptoms of vertigo associated with nausea or vomiting and ultimately was evaluated by Dr. Ivan Croft of ENT, as well as by Dr. Floyde Parkins neurology.  She was diagnosed as having migraines headaches.  She was started on meclizine.  On November 30.  She was awakened from sleep at around 5 AM with palpitations which persisted.  She ultimately presented to the [facility] emergency room where she was found to have tachycardia.  Her ECG reports sinus tachycardia with PACs, but by my review, the EKG shows a at topical low atrial tachycardia, etiology rather than sinus tachycardia.  At that time, she was advised to stop losartan, which had been 25 mg and to initiate Toprol-XL 25 mg daily.  On this therapy, she does note improvement.  However, occasionally she had awakened in the very early morning and has noticed a jittery sensation in her heart rate.  She denied any episodes of chest pain.  She denies any awareness of sleep apnea.  According to her husband, however, she does snore mildly.  She typically gets up one night 1 time per night.  She denied significant nocturia.  She denied excessive daytime sleepiness.    When I  initially saw her, I increased her Toprol-XL to 25 mg in the morning and evening.  An echo Doppler study, an EF of 55-60%.  There was grade 2 diastolic dysfunction.  There was mild AR and showed evidence for mitral annular calcification with trivial MR.  She underwent laboratory.  Renal function was normal.  Left use were normal.  Lipid studies revealed a total cholesterol 172, triglycerides 161, HDL 39, increased VLDL at 32, and LDL at 101.  She presents now for follow-up evaluation.   Past Medical History:  Diagnosis Date  . Dental bridge present    upper  . Dental crowns present   . Dizziness and giddiness   . Glaucoma   . Hypercalcemia   . Hypertension    under control with med., has been on med. x 4 yr.  . Migraine 07/25/2014  . Palpitations   . Seasonal allergies   . 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 CATARACT EXTRACTION W/ INTRAOCULAR LENS IMPLANT Bilateral   . TRIGGER FINGER RELEASE Right 02/20/2014   Procedure: RELEASE A-1 PULLEY RIGHT INDEX,  MIDDLE AND RING FINGERS ;  Surgeon: Wynonia Sours, MD;  Location: [facility];  Service: Orthopedics;  Laterality: Right;  . TUBAL LIGATION     laparoscopic    Current Medications: Outpatient Medications Prior to  Visit  Medication Sig Dispense Refill  . Ascorbic Acid (VITAMIN C) 1000 MG tablet Take 1,000 mg by mouth daily.    Marland Kitchen aspirin 81 MG tablet Take 81 mg by mouth daily.    . Calcium-Magnesium-Vitamin D [id number] MG-MG-UNIT TB24 Take 1 tablet by mouth 2 (two) times daily.    . cholecalciferol (VITAMIN D) 400 UNITS TABS tablet Take 400 Units by mouth 2 (two) times daily.    . Garlic 10 MG CAPS Take 1 capsule by mouth daily.    . Ginger, Zingiber officinalis, (GINGER ROOT PO) Take 1 tablet by mouth daily.    . Glucosamine-Chondroitin (GLUCOSAMINE CHONDR COMPLEX) 500-400 MG CAPS Take 1 tablet by mouth 2 (two)  times daily. 1500 - 200    . hydrochlorothiazide (MICROZIDE) 12.5 MG capsule Take 12.5 mg by mouth daily as needed.     Marland Kitchen LORazepam (ATIVAN) 1 MG tablet Take 0.5 mg by mouth 2 (two) times daily.    Marland Kitchen loteprednol (LOTEMAX) 0.5 % ophthalmic suspension Place 1 drop into both eyes daily.     . meclizine (ANTIVERT) 25 MG tablet Take 25 mg by mouth 4 (four) times daily as needed for dizziness.    . metoprolol succinate (TOPROL-XL) 25 MG 24 hr tablet Take 1 tablet (25 mg total) by mouth 2 (two) times daily. 60 tablet 6  . Multiple Vitamin (MULTIVITAMIN) tablet Take 1 tablet by mouth daily.    . Omega-3 Fatty Acids (FISH OIL) 1000 MG CAPS Take by mouth daily.    . rosuvastatin (CRESTOR) 10 MG tablet Take 10 mg by mouth daily after breakfast.    . timolol (BETIMOL) 0.25 % ophthalmic solution Place 1 drop into both eyes 2 (two) times daily.    Marland Kitchen aspirin 325 MG tablet Take 325 mg by mouth once.    Marland Kitchen losartan (COZAAR) 50 MG tablet Take 50 mg by mouth daily.     No facility-administered medications prior to visit.      Allergies:   Codeine; Penicillins; and Sulfa antibiotics   Social History   Social History  . Marital status: Married    Spouse name: N/A  . Number of children: N/A  . Years of education: N/A   Occupational History  . 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 Smoking status: Never Smoker  . Smokeless tobacco: Never Used  . Alcohol use No  . Drug use: No  . Sexual activity: Not on file   Other Topics Concern  . Not on file   Social History Narrative   Lives at home w/ her husband   Left-handed   Caffeine: occasional soft drink        Family History:  The patient's family history includes Heart attack in her brother; Migraines in her mother; Stroke in her father, mother, and sister.   ROS General: Negative; No fevers, chills, or night sweats;  HEENT: Negative; No changes in vision or hearing, sinus congestion, difficulty swallowing Pulmonary:  Negative; No cough, wheezing, shortness of breath, hemoptysis Cardiovascular: Negative; No chest pain, presyncope, syncope, palpitations GI: Negative; No nausea, vomiting, diarrhea, or abdominal pain GU: Negative; No dysuria, hematuria, or difficulty voiding Musculoskeletal: Negative; no myalgias, joint pain, or weakness Hematologic/Oncology: Negative; no easy bruising, bleeding Endocrine: Negative; no heat/cold intolerance; no diabetes Neuro: positive for recent diagnosis of migraine headaches and vertigo Skin: Negative; No rashes or skin lesions Psychiatric: Negative; No behavioral problems, depression Sleep: Negative; No snoring, daytime sleepiness, hypersomnolence, bruxism, restless legs, hypnogognic  hallucinations, no cataplexy Other comprehensive 14 point system review is negative.   PHYSICAL EXAM:   VS:  BP 134/76   Pulse 75   Ht 5' 4"  (1.626 m)   Wt 134 lb (60.8 kg)   BMI 23.00 kg/m      Wt Readings from Last 3 Encounters:  11/12/16 134 lb (60.8 kg)  09/16/16 137 lb (62.1 kg)  08/28/16 137 lb (62.1 kg)    General: Alert, oriented, no distress.  Skin: normal turgor, no rashes, warm and dry HEENT: Normocephalic, atraumatic. Pupils equal round and reactive to light; sclera anicteric; extraocular muscles intact; Fundi bilateral lens implants.  No hemorrhages or exudates. Nose without nasal septal hypertrophy Mouth/Parynx benign; Mallinpatti scale 2/3 Neck: No JVD, no carotid bruits; normal carotid upstroke Lungs: clear to ausculatation and percussion; no wheezing or rales Chest wall: without tenderness to palpitation Heart: PMI not displaced, RRR, s1 s2 normal, 1/6 systolic murmur along the left sternal border and apex; no diastolic murmur, no rubs, gallops, thrills, or heaves Abdomen: soft, nontender; no hepatosplenomehaly, BS+; abdominal aorta nontender and not dilated by palpation. Back: no CVA tenderness Pulses 2+ Musculoskeletal: full range of motion, normal strength,  no joint deformities Extremities: no clubbing cyanosis or edema, Homan's sign negative  Neurologic: grossly nonfocal; Cranial nerves grossly wnl Psychologic: Normal mood and affect   Studies/Labs Reviewed:   ECG (independently read by me): Normal sinus rhythm at 75 bpm.  Normal intervals.  Nonspecific ST changes.  December 2017 ECG (independently read by me): Normal sinus rhythm at 77 bpm.  PR interval 174 ms, QTc interval 432 ms.  I personally reviewed the ECG from 09/10/2016, and in contrast to the report which reads sinus tachycardia with PACs, the patient is in a low atrial ectopic tachycardic rhythm and then developed sinus rhythm after a pause followed by recurrent low atrial rhythm.  There are PACs.  Recent Labs: BMP Latest Ref Rng & Units 10/13/2016 09/10/2016 07/12/2016  Glucose 65 - 99 mg/dL 80 93 118(H)  BUN 7 - 25 mg/dL 18 28(H) 22(H)  Creatinine 0.60 - 0.93 mg/dL 0.78 0.95 0.93  Sodium 135 - 146 mmol/L 140 141 140  Potassium 3.5 - 5.3 mmol/L 4.1 4.0 3.7  Chloride 98 - 110 mmol/L 107 109 106  CO2 20 - 31 mmol/L 26 25 24   Calcium 8.6 - 10.4 mg/dL 9.6 10.0 10.1     Hepatic Function Latest Ref Rng & Units 10/13/2016 09/10/2016 07/12/2016  Total Protein 6.1 - 8.1 g/dL 7.3 6.9 7.7  Albumin 3.6 - 5.1 g/dL 4.1 3.6 3.4(L)  AST 10 - 35 U/L 18 20 15   ALT 6 - 29 U/L 17 20 12(L)  Alk Phosphatase 33 - 130 U/L 46 54 64  Total Bilirubin 0.2 - 1.2 mg/dL 0.4 0.2(L) 0.1(L)    CBC Latest Ref Rng & Units 09/10/2016 09/07/2016 07/12/2016  WBC 4.0 - 10.5 K/uL 7.3 6.0 7.6  Hemoglobin 12.0 - 15.0 g/dL 13.5 13.2 12.7  Hematocrit 36.0 - 46.0 % 41.0 39.2 38.9  Platelets 150 - 400 K/uL 330 332 584(H)   Lab Results  Component Value Date   MCV 93.4 09/10/2016   MCV 94.0 09/07/2016   MCV 95.3 07/12/2016   Lab Results  Component Value Date   TSH 3.661 09/10/2016   No results found for: HGBA1C   BNP No results found for: BNP  ProBNP No results found for: PROBNP   Lipid Panel       Component Value Date/Time  CHOL 172 10/13/2016 0852   TRIG 161 (H) 10/13/2016 0852   HDL 39 (L) 10/13/2016 0852   CHOLHDL 4.4 10/13/2016 0852   VLDL 32 (H) 10/13/2016 0852   LDLCALC 101 (H) 10/13/2016 1607     Lipid study obtained in Dr. Vickey Sages office on 03/26/2016:  Total cholesterol 186, HDL 48, triglycerides 107, LDL 117.  RADIOLOGY: No results found.   Additional studies/ records that were reviewed today include:  I reviewed the patient's emergency room evaluations.  I have also reviewed Dr. Jannifer Franklin is neurologic evaluation and Dr. Vickey Sages notes.    ASSESSMENT:    1. Essential hypertension, benign   2. Heart palpitations   3. Ectopic atrial tachycardia (HCC)   4. Hyperlipidemia, unspecified hyperlipidemia type      PLAN:  [name] is a 71 year old female who has a history of hypertension, hyperlipidemia, and recently has developed episodes of palpitations and increased heart rate.  I initially saw her, she had noticed her episodes at approximately 5 AM in the morning, which raises the possibility of obstructive sleep apnea contributing  to her symptomatology.  Her ECG from her emergency room evaluation  suggests ectopic atrial tachycardia rather than sinus tachycardia.  Symptoms have significantly improved with initiation of Toprol-XL and with titration to 50 Milligan grams daily.  She denies any recurrent episodes.  He has not been taking losartan, and with her echocardiogram showing grade 2 diastolic dysfunction, I have recommended resumption of losartan, initially at 25 mg for the next 2 weeks.  If on this therapy, she notices that her blood pressure is consistently staying above 1:30.  I have suggested titration to 50 mg.  I have reviewed her recent laboratory.  LDL is 101.  However, I suspect she most likely has small particles with a greater increase particle number by virtue of her elevation of triglycerides, low HDL, and increased VLDL.  I have  suggested that she increase Crestor and is set of taking 3 times per week to take 10 mg daily.  Dr. Karie Kirks will be rechecking blood work in 6-8  weeks.  I will see her back in the office in 6 months or sooner if problems arise.  Hyperlipidemia with target  Medication Adjustments/Labs and Tests Ordered: Current medicines are reviewed at length with the patient today.  Concerns regarding medicines are outlined above.  Medication changes, Labs and Tests ordered today are listed in the Patient Instructions below.  Patient Instructions  Your physician has recommended you make the following change in your medication:   1.) restart the losartan @ 25 mg daily for 2 weeks then if BP elevated increase to 25 mg twice a day.  2.) take the crestor daily instead of 3 times a week.  Your physician wants you to follow-up in: 6 months or sooner if needed. You will receive a reminder letter in the mail two months in advance. If you don't receive a letter, please call our office to schedule the follow-up appointment.  If you need a refill on your cardiac medications before your next appointment, please call your pharmacy.       Signed, Shelva Majestic, MD  11/14/2016 11:59 AM    [facility] [address], [place], [place], [place]  [address] Phone: [phone]

## 2016-11-12 NOTE — Patient Instructions (Signed)
Your physician has recommended you make the following change in your medication:   1.) restart the losartan @ 25 mg daily for 2 weeks then if BP elevated increase to 25 mg twice a day.  2.) take the crestor daily instead of 3 times a week.  Your physician wants you to follow-up in: 6 months or sooner if needed. You will receive a reminder letter in the mail two months in advance. If you don't receive a letter, please call our office to schedule the follow-up appointment.  If you need a refill on your cardiac medications before your next appointment, please call your pharmacy.

## 2016-11-14 ENCOUNTER — Encounter: Payer: Self-pay | Admitting: Cardiovascular Disease

## 2016-11-27 ENCOUNTER — Ambulatory Visit: Payer: Medicare Other | Admitting: Cardiovascular Disease

## 2017-03-29 ENCOUNTER — Other Ambulatory Visit: Payer: Self-pay | Admitting: Family Medicine

## 2017-03-29 DIAGNOSIS — Z1231 Encounter for screening mammogram for malignant neoplasm of breast: Secondary | ICD-10-CM

## 2017-04-16 ENCOUNTER — Ambulatory Visit
Admission: RE | Admit: 2017-04-16 | Discharge: 2017-04-16 | Disposition: A | Discharge status: Home / Self Care | Payer: Medicare Other | Source: Ambulatory Visit | Attending: Family Medicine | Admitting: Family Medicine

## 2017-04-16 ENCOUNTER — Telehealth: Payer: Self-pay | Admitting: Cardiovascular Disease

## 2017-04-16 DIAGNOSIS — Z1231 Encounter for screening mammogram for malignant neoplasm of breast: Secondary | ICD-10-CM

## 2017-04-16 NOTE — Telephone Encounter (Signed)
Spoke with patient and she was not happy about having to wait until October to be seen. Stated she saw Dr Tresa EndoKelly in  February and was told she would get a letter to call for appointment. Patient never received letter and called today to get appointment. Patient denies any problems. Did offer appointment with PA/NP and she refused. Apologized to patient multiple times for no letter being sent/any inconvenience, again offered follow up with extender. She only wants to see Dr Tresa EndoKelly. Advised patient if she had any problems between now and follow up to call back.

## 2017-04-16 NOTE — Telephone Encounter (Signed)
New message  Pt call requesting to speak with RN. Pt would like to be seen sooner than next schedule appt. Please call back to discuss

## 2017-07-06 ENCOUNTER — Other Ambulatory Visit (HOSPITAL_COMMUNITY): Payer: Self-pay | Admitting: Family Medicine

## 2017-07-06 DIAGNOSIS — I6523 Occlusion and stenosis of bilateral carotid arteries: Secondary | ICD-10-CM

## 2017-07-12 ENCOUNTER — Ambulatory Visit (HOSPITAL_COMMUNITY)
Admission: RE | Admit: 2017-07-12 | Discharge: 2017-07-12 | Disposition: A | Discharge status: Home / Self Care | Payer: Medicare Other | Source: Ambulatory Visit | Attending: Family Medicine | Admitting: Family Medicine

## 2017-07-12 DIAGNOSIS — I6523 Occlusion and stenosis of bilateral carotid arteries: Secondary | ICD-10-CM | POA: Insufficient documentation

## 2017-07-14 ENCOUNTER — Telehealth: Payer: Self-pay | Admitting: Cardiovascular Disease

## 2017-07-14 NOTE — Telephone Encounter (Signed)
Received records from [facility] for appointment on 07/15/17 with Dr Tresa Endo.  Records put with Dr Landry Dyke schedule for 07/15/17. lp

## 2017-07-15 ENCOUNTER — Ambulatory Visit (INDEPENDENT_AMBULATORY_CARE_PROVIDER_SITE_OTHER): Payer: Medicare Other | Admitting: Cardiovascular Disease

## 2017-07-15 ENCOUNTER — Encounter: Payer: Self-pay | Admitting: Cardiovascular Disease

## 2017-07-15 VITALS — BP 126/62 | HR 63 | Ht 64.0 in | Wt 130.2 lb

## 2017-07-15 DIAGNOSIS — I6523 Occlusion and stenosis of bilateral carotid arteries: Secondary | ICD-10-CM | POA: Diagnosis not present

## 2017-07-15 DIAGNOSIS — I10 Essential (primary) hypertension: Secondary | ICD-10-CM | POA: Diagnosis not present

## 2017-07-15 DIAGNOSIS — I519 Heart disease, unspecified: Secondary | ICD-10-CM | POA: Diagnosis not present

## 2017-07-15 DIAGNOSIS — R002 Palpitations: Secondary | ICD-10-CM

## 2017-07-15 DIAGNOSIS — E785 Hyperlipidemia, unspecified: Secondary | ICD-10-CM

## 2017-07-15 DIAGNOSIS — I5189 Other ill-defined heart diseases: Secondary | ICD-10-CM

## 2017-07-15 MED ORDER — ROSUVASTATIN CALCIUM 20 MG PO TABS: 20.0000 mg | ORAL_TABLET | Freq: Every day | ORAL | 3 refills | Status: DC

## 2017-07-15 NOTE — Addendum Note (Signed)
Addended by: Chana Bode on: 07/15/2017 04:33 PM   Modules accepted: Orders

## 2017-07-15 NOTE — Progress Notes (Signed)
Cardiology Office Note    Date:  07/15/2017   ID:  Sandra Henry, DOB [birth year], MRN [id number]  PCP:  Lemmie Evens, MD  Cardiologist:  Shelva Majestic, MD    History of Present Illness:  Sandra Henry is a 71 y.o. female who presents for a 7 month follow-up cardiology evaluation.   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  She has a history of hypertension  She has a history of hyperlipidemia and had been taking Crestor 10 mg 3 times per week.  In October 2017, she developed symptoms of vertigo associated with nausea or vomiting and ultimately was evaluated by Dr. Ivan Croft of ENT, as well as by Dr. Floyde Parkins neurology.  She was diagnosed as having migraines headaches.  She was started on meclizine.  On November 30.  She was awakened from sleep at around 5 AM with palpitations which persisted.  She ultimately presented to the [facility] emergency room where she was found to have tachycardia.  Her ECG reports sinus tachycardia with PACs, but by my review, the EKG shows a at topical low atrial tachycardia, etiology rather than sinus tachycardia.  At that time, she was advised to stop losartan, which had been 25 mg and to initiate Toprol-XL 25 mg daily.  On this therapy, she does note improvement.  However, occasionally she had awakened in the very early morning and has noticed a jittery sensation in her heart rate.  She denied any episodes of chest pain.  She denies any awareness of sleep apnea.  According to her husband, however, she does snore mildly.  She typically gets up one night 1 time per night.  She denied significant nocturia.  She denied excessive daytime sleepiness.    When I initially saw her, I increased her Toprol-XL to 25 mg in the morning and evening.  An echo Doppler study, an EF of 55-60%.  There was grade 2 diastolic dysfunction.  There was mild AR and showed evidence for mitral annular calcification with trivial  MR.  She underwent laboratory.  Renal function was normal.  Left use were normal.  Lipid studies revealed a total cholesterol 172, triglycerides 161, HDL 39, increased VLDL at 32, and LDL at 101.   Since I last saw her, she has continued to feel well with reference to palpitations and has not experienced any since initiating Toprol.  She recently underwent carotid Doppler imaging which showed stable carotid stenoses with bilateral nonobstructive plaque with narrowing less than 50% bilaterally.  She had antegrade flow and normal waveforms in her vertebrals.  She had undergone laboratory by Dr. Karie Kirks on 07/06/2017.  Total cholesterol was 152, triglycerides 110, HDL 46, and LDL 85.  She has continued to take rosuvastatin 10 mg.  She has an on Toprol-XL 37.5 mg in the morning and 25 mg in the evening.  She also is on losartan 50 mg twice a day, and has rarely taken HCTZ for leg swelling.  She presents for evaluation.   Past Medical History:  Diagnosis Date  . Dental bridge present    upper  . Dental crowns present   . Dizziness and giddiness   . Glaucoma   . Hypercalcemia   . Hypertension    under control with med., has been on med. x 4 yr.  . Migraine 07/25/2014  . Palpitations   . Seasonal allergies   . Stenosing tenosynovitis of finger of right hand 02/2014  index, middle and ring fingers    Past Surgical History:  Procedure Laterality Date  . BREAST BIOPSY Left   . CATARACT EXTRACTION W/ INTRAOCULAR LENS IMPLANT Bilateral   . TRIGGER FINGER RELEASE Right 02/20/2014   Procedure: RELEASE A-1 PULLEY RIGHT INDEX,  MIDDLE AND RING FINGERS ;  Surgeon: Wynonia Sours, MD;  Location: [facility];  Service: Orthopedics;  Laterality: Right;  . TUBAL LIGATION     laparoscopic    Current Medications: Outpatient Medications Prior to Visit  Medication Sig Dispense Refill  . Ascorbic Acid (VITAMIN C) 1000 MG tablet Take 1,000 mg by mouth daily.    Marland Kitchen aspirin 81 MG tablet Take 81 mg  by mouth daily.    . Calcium-Magnesium-Vitamin D 606-30-160 MG-MG-UNIT TB24 Take 1 tablet by mouth 2 (two) times daily.    . cholecalciferol (VITAMIN D) 400 UNITS TABS tablet Take 400 Units by mouth 2 (two) times daily.    . Garlic 10 MG CAPS Take 1 capsule by mouth daily.    . Glucosamine-Chondroitin (GLUCOSAMINE CHONDR COMPLEX) 500-400 MG CAPS Take 1 tablet by mouth 2 (two) times daily. 1500 - 200    . hydrochlorothiazide (MICROZIDE) 12.5 MG capsule Take 12.5 mg by mouth daily as needed.     Marland Kitchen LORazepam (ATIVAN) 1 MG tablet Take 0.5 mg by mouth 2 (two) times daily.    Marland Kitchen loteprednol (LOTEMAX) 0.5 % ophthalmic suspension Place 1 drop into both eyes daily.     . meclizine (ANTIVERT) 25 MG tablet Take 25 mg by mouth 4 (four) times daily as needed for dizziness.    . Multiple Vitamin (MULTIVITAMIN) tablet Take 1 tablet by mouth daily.    . Omega-3 Fatty Acids (FISH OIL) 1000 MG CAPS Take by mouth daily.    . timolol (BETIMOL) 0.25 % ophthalmic solution Place 1 drop into both eyes 2 (two) times daily.    . rosuvastatin (CRESTOR) 10 MG tablet Take 10 mg by mouth daily after breakfast.    . Ginger, Zingiber officinalis, (GINGER ROOT PO) Take 1 tablet by mouth daily.    Marland Kitchen losartan (COZAAR) 25 MG tablet Take 1/2 tablet daily for 2 weeks. If BP elevated increase to 1/2 tablet twice a day. 90 tablet 3  . metoprolol succinate (TOPROL-XL) 25 MG 24 hr tablet Take 1 tablet (25 mg total) by mouth 2 (two) times daily. 60 tablet 6   No facility-administered medications prior to visit.      Allergies:   Codeine; Penicillins; and Sulfa antibiotics   Social History   Social History  . Marital status: Married    Spouse name: N/A  . Number of children: N/A  . Years of education: N/A   Occupational History  . 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 Smoking status: Never Smoker  . Smokeless tobacco: Never Used  . Alcohol use No  . Drug use: No  . Sexual activity: Not Asked    Other Topics Concern  . None   Social History Narrative   Lives at home w/ her husband   Left-handed   Caffeine: occasional soft drink        Family History:  The patient's family history includes Heart attack in her brother; Migraines in her mother; Stroke in her father, mother, and sister.   ROS General: Negative; No fevers, chills, or night sweats;  HEENT: Negative; No changes in vision or hearing, sinus congestion, difficulty swallowing Pulmonary: Negative; No cough, wheezing, shortness  of breath, hemoptysis Cardiovascular: Negative; No chest pain, presyncope, syncope, palpitations GI: Negative; No nausea, vomiting, diarrhea, or abdominal pain GU: Negative; No dysuria, hematuria, or difficulty voiding Musculoskeletal: Negative; no myalgias, joint pain, or weakness Hematologic/Oncology: Negative; no easy bruising, bleeding Endocrine: Negative; no heat/cold intolerance; no diabetes Neuro: positive for recent diagnosis of migraine headaches and vertigo Skin: Negative; No rashes or skin lesions Psychiatric: Negative; No behavioral problems, depression Sleep: Negative; No snoring, daytime sleepiness, hypersomnolence, bruxism, restless legs, hypnogognic hallucinations, no cataplexy Other comprehensive 14 point system review is negative.   PHYSICAL EXAM:   VS:  BP 126/62   Pulse 63   Ht _0  (1.626 m)   Wt 130 lb 3.2 oz (59.1 kg)   BMI 22.35 kg/m     Repeat blood pressure by me was 114/66  Wt Readings from Last 3 Encounters:  07/15/17 130 lb 3.2 oz (59.1 kg)  11/12/16 134 lb (60.8 kg)  09/16/16 137 lb (62.1 kg)    General: Alert, oriented, no distress.  Skin: normal turgor, no rashes, warm and dry HEENT: Normocephalic, atraumatic. Pupils equal round and reactive to light; sclera anicteric; extraocular muscles intact;  Nose without nasal septal hypertrophy Mouth/Parynx benign; Mallinpatti scale 2/3 Neck: No JVD, no carotid bruits; normal carotid upstroke Lungs:  clear to ausculatation and percussion; no wheezing or rales Chest wall: without tenderness to palpitation Heart: PMI not displaced, RRR, s1 s2 normal, 1/6 systolic murmur, no diastolic murmur, no rubs, gallops, thrills, or heaves Abdomen: soft, nontender; no hepatosplenomehaly, BS+; abdominal aorta nontender and not dilated by palpation. Back: no CVA tenderness Pulses 2+ Musculoskeletal: full range of motion, normal strength, no joint deformities Extremities: no clubbing cyanosis or edema, Homan's sign negative  Neurologic: grossly nonfocal; Cranial nerves grossly wnl Psychologic: Normal mood and affect   Studies/Labs Reviewed:   ECG (independently read by me): Normal sinus rhythm at 63 bpm.  No ST segment changes.  Normal intervals.  February 2018 ECG (independently read by me): Normal sinus rhythm at 75 bpm.  Normal intervals.  Nonspecific ST changes.  December 2017 ECG (independently read by me): Normal sinus rhythm at 77 bpm.  PR interval 174 ms, QTc interval 432 ms.  I personally reviewed the ECG from 09/10/2016, and in contrast to the report which reads sinus tachycardia with PACs, the patient is in a low atrial ectopic tachycardic rhythm and then developed sinus rhythm after a pause followed by recurrent low atrial rhythm.  There are PACs.  Recent Labs: BMP Latest Ref Rng & Units 10/13/2016 09/10/2016 07/12/2016  Glucose 65 - 99 mg/dL 80 93 118(H)  BUN 7 - 25 mg/dL 18 28(H) 22(H)  Creatinine 0.60 - 0.93 mg/dL 0.78 0.95 0.93  Sodium 135 - 146 mmol/L 140 141 140  Potassium 3.5 - 5.3 mmol/L 4.1 4.0 3.7  Chloride 98 - 110 mmol/L 107 109 106  CO2 20 - 31 mmol/L _1 Calcium 8.6 - 10.4 mg/dL 9.6 10.0 10.1     Hepatic Function Latest Ref Rng & Units 10/13/2016 09/10/2016 07/12/2016  Total Protein 6.1 - 8.1 g/dL 7.3 6.9 7.7  Albumin 3.6 - 5.1 g/dL 4.1 3.6 3.4(L)  AST 10 - 35 U/L _2 ALT 6 - 29 U/L 17 20 12(L)  Alk Phosphatase 33 - 130 U/L 46 54 64  Total Bilirubin 0.2 -  1.2 mg/dL 0.4 0.2(L) 0.1(L)    CBC Latest Ref Rng & Units 09/10/2016 09/07/2016 07/12/2016  WBC 4.0 - 10.5 K/uL 7.3  6.0 7.6  Hemoglobin 12.0 - 15.0 g/dL 13.5 13.2 12.7  Hematocrit 36.0 - 46.0 % 41.0 39.2 38.9  Platelets 150 - 400 K/uL 330 332 584(H)   Lab Results  Component Value Date   MCV 93.4 09/10/2016   MCV 94.0 09/07/2016   MCV 95.3 07/12/2016   Lab Results  Component Value Date   TSH 3.661 09/10/2016   No results found for: HGBA1C   BNP No results found for: BNP  ProBNP No results found for: PROBNP   Lipid Panel     Component Value Date/Time   CHOL 172 10/13/2016 0852   TRIG 161 (H) 10/13/2016 0852   HDL 39 (L) 10/13/2016 0852   CHOLHDL 4.4 10/13/2016 0852   VLDL 32 (H) 10/13/2016 0852   LDLCALC 101 (H) 10/13/2016 8119     Lipid study obtained in Dr. Vickey Sages office on 03/26/2016:  Total cholesterol 186, HDL 48, triglycerides 107, LDL 117.  RADIOLOGY: US Carotid Bilateral  Result Date: 07/12/2017 CLINICAL DATA:  Follow-up bilateral carotid artery stenosis. EXAM: BILATERAL CAROTID DUPLEX ULTRASOUND TECHNIQUE: Pearline Cables scale imaging, color Doppler and duplex ultrasound were performed of bilateral carotid and vertebral arteries in the neck. COMPARISON:  04/27/2016 FINDINGS: Criteria: Quantification of carotid stenosis is based on velocity parameters that correlate the residual internal carotid diameter with NASCET-based stenosis levels, using the diameter of the distal internal carotid lumen as the denominator for stenosis measurement. The following velocity measurements were obtained: RIGHT ICA:  98 cm/sec CCA:  89 cm/sec SYSTOLIC ICA/CCA RATIO:  1.1 DIASTOLIC ICA/CCA RATIO:  1.3 ECA:  115 cm/sec LEFT ICA:  92 cm/sec CCA:  94 cm/sec SYSTOLIC ICA/CCA RATIO:  1.0 DIASTOLIC ICA/CCA RATIO:  1.4 ECA:  75 cm/sec RIGHT CAROTID ARTERY: Right common carotid artery is patent. Again noted is plaque at the origin of the external carotid artery. External carotid artery is patent  with normal waveform. Normal waveforms and velocities in the internal carotid artery. RIGHT VERTEBRAL ARTERY: Antegrade flow and normal waveform in the right vertebral artery. LEFT CAROTID ARTERY: Echogenic plaque at the carotid bulb near the origin of the left internal carotid artery is stable. Again noted is some plaque near the origin of the external carotid artery which is stable. External carotid artery is patent with normal waveform. Normal waveforms and velocities in the internal carotid artery. LEFT VERTEBRAL ARTERY: Antegrade flow and normal waveform in the left vertebral artery. IMPRESSION: Stable carotid artery duplex examination. Mild atherosclerotic disease in the carotid arteries. Estimated degree of stenosis in the internal carotid arteries is less than 50% bilaterally. Patent vertebral arteries with antegrade flow. Electronically Signed   By: Markus Daft M.D.   On: 07/12/2017 08:31     Additional studies/ records that were reviewed today include:  I reviewed the patient's emergency room evaluations.  I have also reviewed Dr. Jannifer Franklin is neurologic evaluation and Dr. Vickey Sages notes.  I reviewed her recent laboratory from 07/06/2017.    ASSESSMENT:    1. Essential hypertension, benign   2. Grade II diastolic dysfunction   3. Asymptomatic bilateral carotid artery stenosis   4. Hyperlipidemia with target LDL less than 70   5. Heart palpitations      PLAN:  Sandra Henry is a 71 year old female who has a history of hypertension, hyperlipidemia, and  had developed episodes of palpitations and increased heart rate.  I initially saw her, she had noticed her episodes at approximately 5 AM in the morning, which raised the possibility of obstructive sleep apnea  contributing  to her symptomatology.  Her ECG from her emergency room evaluation  suggested ectopic atrial tachycardia rather than sinus tachycardia.  Symptoms have significantly improved with initiation of Toprol-XL since I last  saw her in further titrated Toprol to 37.5 mg in the morning and 25 mg at nighttime, she has not experienced any episodes of palpitations.  Her blood pressure today is stable.  Also on losartan 50 mg twice a day.  There is no edema and she is only rarely taking HCTZ.  I reviewed her most recent carotid duplex study which reveals subclinical stenosis with bilateral narrowing less than 50%.  Her recent laboratory by Dr. Karie Kirks is shown an LDL of 85 and she has been on Crestor 10 mg daily.  I have suggested that she increase Crestor to 20 mg in attempt to reduce LDL less than 70 and hopefully induce plaque regression.  She is asymptomatic.  She's not having any chest pain or shortness of breath.  She was found to have grade 2 diastolic dysfunction on echocardiography.  She will be having follow-up laboratory with Dr. Karie Kirks in several months.  I will see her in one year for reevaluation.  Medication Adjustments/Labs and Tests Ordered: Current medicines are reviewed at length with the patient today.  Concerns regarding medicines are outlined above.  Medication changes, Labs and Tests ordered today are listed in the Patient Instructions below.  Patient Instructions  Medication Instructions:  INCREASE rosuvastatin (Crestor) to 20 mg daily  Follow-Up: Your physician wants you to follow-up in: 12 MONTHS with Dr. Claiborne Billings.  You will receive a reminder letter in the mail two months in advance. If you don't receive a letter, please call our office to schedule the follow-up appointment.   Any Other Special Instructions Will Be Listed Below (If Applicable).     If you need a refill on your cardiac medications before your next appointment, please call your pharmacy.      Signed, Shelva Majestic, MD  07/15/2017 3:02 PM    [facility] [address] [place], [place], [place]  [address] Phone: [phone]

## 2017-07-15 NOTE — Patient Instructions (Signed)
Medication Instructions:  INCREASE rosuvastatin (Crestor) to 20 mg daily  Follow-Up: Your physician wants you to follow-up in: 12 MONTHS with Dr. Tresa Endo.  You will receive a reminder letter in the mail two months in advance. If you don't receive a letter, please call our office to schedule the follow-up appointment.   Any Other Special Instructions Will Be Listed Below (If Applicable).     If you need a refill on your cardiac medications before your next appointment, please call your pharmacy.

## 2018-03-08 ENCOUNTER — Other Ambulatory Visit (HOSPITAL_COMMUNITY): Payer: Self-pay | Admitting: Family Medicine

## 2018-03-08 ENCOUNTER — Ambulatory Visit (HOSPITAL_COMMUNITY)
Admission: RE | Admit: 2018-03-08 | Discharge: 2018-03-08 | Disposition: A | Discharge status: Home / Self Care | Payer: Medicare Other | Source: Ambulatory Visit | Attending: Family Medicine | Admitting: Family Medicine

## 2018-03-08 DIAGNOSIS — M7989 Other specified soft tissue disorders: Secondary | ICD-10-CM | POA: Diagnosis present

## 2018-03-08 DIAGNOSIS — M79662 Pain in left lower leg: Secondary | ICD-10-CM | POA: Diagnosis present

## 2018-03-21 ENCOUNTER — Other Ambulatory Visit: Payer: Self-pay | Admitting: Family Medicine

## 2018-03-21 DIAGNOSIS — Z1231 Encounter for screening mammogram for malignant neoplasm of breast: Secondary | ICD-10-CM

## 2018-03-30 ENCOUNTER — Ambulatory Visit (HOSPITAL_COMMUNITY)
Admission: RE | Admit: 2018-03-30 | Discharge: 2018-03-30 | Disposition: A | Discharge status: Home / Self Care | Payer: Medicare Other | Source: Ambulatory Visit | Attending: Nurse Practitioner | Admitting: Nurse Practitioner

## 2018-03-30 ENCOUNTER — Other Ambulatory Visit (HOSPITAL_COMMUNITY): Payer: Self-pay | Admitting: Nurse Practitioner

## 2018-03-30 DIAGNOSIS — M5442 Lumbago with sciatica, left side: Secondary | ICD-10-CM | POA: Insufficient documentation

## 2018-03-30 DIAGNOSIS — M79605 Pain in left leg: Secondary | ICD-10-CM | POA: Diagnosis present

## 2018-03-30 DIAGNOSIS — I700 Atherosclerosis of aorta: Secondary | ICD-10-CM | POA: Insufficient documentation

## 2018-03-30 DIAGNOSIS — M5136 Other intervertebral disc degeneration, lumbar region: Secondary | ICD-10-CM | POA: Diagnosis not present

## 2018-04-18 ENCOUNTER — Ambulatory Visit (HOSPITAL_COMMUNITY)
Admission: RE | Admit: 2018-04-18 | Discharge: 2018-04-18 | Disposition: A | Discharge status: Home / Self Care | Payer: Medicare Other | Source: Ambulatory Visit | Attending: Family Medicine | Admitting: Family Medicine

## 2018-04-18 ENCOUNTER — Other Ambulatory Visit (HOSPITAL_COMMUNITY): Payer: Self-pay | Admitting: Family Medicine

## 2018-04-18 DIAGNOSIS — M545 Low back pain: Secondary | ICD-10-CM

## 2018-04-18 DIAGNOSIS — M47816 Spondylosis without myelopathy or radiculopathy, lumbar region: Secondary | ICD-10-CM | POA: Diagnosis not present

## 2018-04-18 DIAGNOSIS — M48061 Spinal stenosis, lumbar region without neurogenic claudication: Secondary | ICD-10-CM | POA: Insufficient documentation

## 2018-04-19 ENCOUNTER — Telehealth (HOSPITAL_COMMUNITY): Payer: Self-pay | Admitting: Family Medicine

## 2018-04-19 NOTE — Telephone Encounter (Signed)
04/19/18  Patient asked if she could do therapy here and a place in [place] since she lived in [place] but worked in [place].  I asked Beth and she said that she could... Do the initial eval here and then to let us know which facility in [place] she would want to do therapy.  I called the patient back and told her this and she said that if she decided to go that route she would call us back.

## 2018-04-21 ENCOUNTER — Ambulatory Visit
Admission: RE | Admit: 2018-04-21 | Discharge: 2018-04-21 | Disposition: A | Discharge status: Home / Self Care | Payer: Medicare Other | Source: Ambulatory Visit | Attending: Family Medicine | Admitting: Family Medicine

## 2018-04-21 DIAGNOSIS — Z1231 Encounter for screening mammogram for malignant neoplasm of breast: Secondary | ICD-10-CM

## 2018-05-09 ENCOUNTER — Other Ambulatory Visit: Payer: Self-pay | Admitting: Family Medicine

## 2018-05-09 DIAGNOSIS — G8929 Other chronic pain: Secondary | ICD-10-CM

## 2018-05-09 DIAGNOSIS — M545 Low back pain: Principal | ICD-10-CM

## 2018-05-12 ENCOUNTER — Ambulatory Visit
Admission: RE | Admit: 2018-05-12 | Discharge: 2018-05-12 | Disposition: A | Discharge status: Home / Self Care | Payer: Medicare Other | Source: Ambulatory Visit | Attending: Family Medicine | Admitting: Family Medicine

## 2018-05-12 DIAGNOSIS — M545 Low back pain: Principal | ICD-10-CM

## 2018-05-12 DIAGNOSIS — G8929 Other chronic pain: Secondary | ICD-10-CM

## 2018-05-12 MED ORDER — IOPAMIDOL (ISOVUE-M 200) INJECTION 41%
1.0000 mL | Freq: Once | INTRAMUSCULAR | Status: AC
  Administered 2018-05-12: 1 mL via EPIDURAL

## 2018-05-12 MED ORDER — METHYLPREDNISOLONE ACETATE 40 MG/ML INJ SUSP (RADIOLOG
120.0000 mg | Freq: Once | INTRAMUSCULAR | Status: AC
  Administered 2018-05-12: 120 mg via EPIDURAL

## 2018-05-12 NOTE — Discharge Instructions (Signed)

## 2018-05-17 ENCOUNTER — Other Ambulatory Visit: Payer: Self-pay | Admitting: Nurse Practitioner

## 2018-05-17 DIAGNOSIS — M545 Low back pain: Principal | ICD-10-CM

## 2018-05-17 DIAGNOSIS — G8929 Other chronic pain: Secondary | ICD-10-CM

## 2018-05-26 ENCOUNTER — Ambulatory Visit
Admission: RE | Admit: 2018-05-26 | Discharge: 2018-05-26 | Disposition: A | Discharge status: Home / Self Care | Payer: Medicare Other | Source: Ambulatory Visit | Attending: Nurse Practitioner | Admitting: Nurse Practitioner

## 2018-05-26 DIAGNOSIS — M545 Low back pain: Principal | ICD-10-CM

## 2018-05-26 DIAGNOSIS — G8929 Other chronic pain: Secondary | ICD-10-CM

## 2018-05-26 MED ORDER — IOPAMIDOL (ISOVUE-M 200) INJECTION 41%
1.0000 mL | Freq: Once | INTRAMUSCULAR | Status: AC
  Administered 2018-05-26: 1 mL via EPIDURAL

## 2018-05-26 MED ORDER — METHYLPREDNISOLONE ACETATE 40 MG/ML INJ SUSP (RADIOLOG
120.0000 mg | Freq: Once | INTRAMUSCULAR | Status: AC
  Administered 2018-05-26: 120 mg via EPIDURAL

## 2018-06-29 ENCOUNTER — Emergency Department (HOSPITAL_COMMUNITY)
Admission: EM | Admit: 2018-06-29 | Discharge: 2018-06-30 | Disposition: A | Discharge status: Home / Self Care | Payer: Medicare Other | Attending: Emergency Medicine | Admitting: Emergency Medicine

## 2018-06-29 DIAGNOSIS — I10 Essential (primary) hypertension: Secondary | ICD-10-CM | POA: Insufficient documentation

## 2018-06-29 DIAGNOSIS — Z79899 Other long term (current) drug therapy: Secondary | ICD-10-CM | POA: Diagnosis not present

## 2018-06-29 DIAGNOSIS — M79605 Pain in left leg: Secondary | ICD-10-CM | POA: Insufficient documentation

## 2018-06-29 DIAGNOSIS — Z7982 Long term (current) use of aspirin: Secondary | ICD-10-CM | POA: Diagnosis not present

## 2018-06-30 ENCOUNTER — Ambulatory Visit (HOSPITAL_BASED_OUTPATIENT_CLINIC_OR_DEPARTMENT_OTHER)
Admission: RE | Admit: 2018-06-30 | Discharge: 2018-06-30 | Disposition: A | Discharge status: Home / Self Care | Payer: Medicare Other | Source: Ambulatory Visit | Attending: Emergency Medicine | Admitting: Emergency Medicine

## 2018-06-30 ENCOUNTER — Encounter (HOSPITAL_COMMUNITY): Payer: Self-pay

## 2018-06-30 DIAGNOSIS — M79605 Pain in left leg: Secondary | ICD-10-CM | POA: Diagnosis not present

## 2018-06-30 DIAGNOSIS — M79609 Pain in unspecified limb: Secondary | ICD-10-CM

## 2018-06-30 MED ORDER — ENOXAPARIN SODIUM 60 MG/0.6ML ~~LOC~~ SOLN
57.0000 mg | Freq: Once | SUBCUTANEOUS | Status: AC
  Administered 2018-06-30: 55 mg via SUBCUTANEOUS
  Filled 2018-06-30: qty 0.55

## 2018-06-30 NOTE — ED Triage Notes (Signed)
Pt states that for the past two days she has been having pain in her L calf with a lump, pt states that she also has sciatic and is not sure if the pain is from her sciatic or the lump, no redness or warmth.

## 2018-06-30 NOTE — Discharge Instructions (Addendum)
Please return for the ultrasound of your leg to rule out DVT in the morning.

## 2018-06-30 NOTE — ED Provider Notes (Signed)
TIME SEEN: 2:29 AM  CHIEF COMPLAINT: Left leg pain  HPI: Patient is a 72 year old female with history of hypertension, sciatica who presents to the emergency department with left leg pain.  States she always has pain in the left leg is secondary to her sciatica but states that she thinks it has been increasing and noticed a "knot" in her posterior left calf for the past several days.  No injury to the leg.  No swelling.  No numbness or weakness.  No fever.  No skin changes.  No chest pain or shortness of breath.  ROS: See HPI Constitutional: no fever  Eyes: no drainage  ENT: no runny nose   Cardiovascular:  no chest pain  Resp: no SOB  GI: no vomiting GU: no dysuria Integumentary: no rash  Allergy: no hives  Musculoskeletal: no leg swelling  Neurological: no slurred speech ROS otherwise negative  PAST MEDICAL HISTORY/PAST SURGICAL HISTORY:  Past Medical History:  Diagnosis Date  . Dental bridge present    upper  . Dental crowns present   . Dizziness and giddiness   . Glaucoma   . Hypercalcemia   . Hypertension    under control with med., has been on med. x 4 yr.  . Migraine 07/25/2014  . Palpitations   . Seasonal allergies   . Stenosing tenosynovitis of finger of right hand 02/2014   index, middle and ring fingers    MEDICATIONS:  Prior to Admission medications   Medication Sig Start Date End Date Taking? Authorizing Provider  Ascorbic Acid (VITAMIN C) 1000 MG tablet Take 1,000 mg by mouth daily.    [provider]  aspirin 81 MG tablet Take 81 mg by mouth daily.    [provider]  Calcium-Magnesium-Vitamin D 600-40-500 MG-MG-UNIT TB24 Take 1 tablet by mouth 2 (two) times daily.    [provider]  cholecalciferol (VITAMIN D) 400 UNITS TABS tablet Take 400 Units by mouth 2 (two) times daily.    [provider]  Garlic 10 MG CAPS Take 1 capsule by mouth daily.    [provider]  Glucosamine-Chondroitin (GLUCOSAMINE CHONDR  COMPLEX) 500-400 MG CAPS Take 1 tablet by mouth 2 (two) times daily. 1500 - 200    [provider]  hydrochlorothiazide (MICROZIDE) 12.5 MG capsule Take 12.5 mg by mouth daily as needed.     [provider]  LORazepam (ATIVAN) 1 MG tablet Take 0.5 mg by mouth 2 (two) times daily. 09/07/16   [provider]  losartan (COZAAR) 50 MG tablet Take 50 mg by mouth 2 (two) times daily. 05/20/17   [provider]  loteprednol (LOTEMAX) 0.5 % ophthalmic suspension Place 1 drop into both eyes daily.  07/22/16   [provider]  meclizine (ANTIVERT) 25 MG tablet Take 25 mg by mouth 4 (four) times daily as needed for dizziness.    [provider]  metoprolol succinate (TOPROL-XL) 25 MG 24 hr tablet Take by mouth 2 (two) times daily. Pt takes 1.5 tablet in the morning and one tablet in the evening    [provider]  Multiple Vitamin (MULTIVITAMIN) tablet Take 1 tablet by mouth daily.    [provider]  Omega-3 Fatty Acids (FISH OIL) 1000 MG CAPS Take by mouth daily.    [provider]  rosuvastatin (CRESTOR) 20 MG tablet Take 1 tablet (20 mg total) by mouth daily after breakfast. 07/15/17   Lennette Bihari, MD  timolol (BETIMOL) 0.25 % ophthalmic solution Place 1  drop into both eyes 2 (two) times daily.    [provider]    ALLERGIES:  Allergies  Allergen Reactions  . Codeine Nausea Only  . Penicillins Rash    AS A CHILD  . Sulfa Antibiotics Rash    SOCIAL HISTORY:  Social History   Tobacco Use  . Smoking status: Never Smoker  . Smokeless tobacco: Never Used  Substance Use Topics  . Alcohol use: No    FAMILY HISTORY: Family History  Problem Relation Age of Onset  . Stroke Mother   . Migraines Mother   . Stroke Father   . Heart attack Brother   . Stroke Sister     EXAM: BP (!) 163/48 (BP Location: Right Arm)   Pulse 73   Temp 97.7 F (36.5 C) (Oral)   Resp 18   SpO2 99%  CONSTITUTIONAL: Alert  and oriented and responds appropriately to questions. Well-appearing; well-nourished HEAD: Normocephalic EYES: Conjunctivae clear, pupils appear equal, EOMI ENT: normal nose; moist mucous membranes NECK: Supple, no meningismus, no nuchal rigidity, no LAD  CARD: RRR; S1 and S2 appreciated; no murmurs, no clicks, no rubs, no gallops RESP: Normal chest excursion without splinting or tachypnea; breath sounds clear and equal bilaterally; no wheezes, no rhonchi, no rales, no hypoxia or respiratory distress, speaking full sentences ABD/GI: Normal bowel sounds; non-distended; soft, non-tender, no rebound, no guarding, no peritoneal signs, no hepatosplenomegaly BACK:  The back appears normal and is non-tender to palpation, there is no CVA tenderness EXT: Patient has 2+ DP pulses bilaterally.  Mild tenderness noted to the left posterior calf but no nodules or cords appreciated.  She does have some varicose veins on examination.  No bleeding.  No redness or warmth.  Compartments are soft.  Normal ROM in all joints; otherwise extremities are non-tender to palpation; no edema; normal capillary refill; no cyanosis, no calf tenderness or swelling    SKIN: Normal color for age and race; warm; no rash NEURO: Moves all extremities equally PSYCH: The patient's mood and manner are appropriate. Grooming and personal hygiene are appropriate.  MEDICAL DECISION MAKING: Patient here with left leg pain.  Unfortunately at this time we are unable to obtain a venous Doppler but can order this for the morning.  We will give her a dose of therapeutic Lovenox now.  She has no signs of cellulitis, compartment syndrome, injury to suggest fracture.  She is neurovascularly intact distally.  No chest pain or shortness of breath.  Patient agrees to follow-up in the morning for venous Doppler.  At this time, I do not feel there is any life-threatening condition present. I have reviewed and discussed all results (EKG, imaging, lab, urine  as appropriate) and exam findings with patient/family. I have reviewed nursing notes and appropriate previous records.  I feel the patient is safe to be discharged home without further emergent workup and can continue workup as an outpatient as needed. Discussed usual and customary return precautions. Patient/family verbalize understanding and are comfortable with this plan.  Outpatient follow-up has been provided if needed. All questions have been answered.      Junie Avilla, Layla MawKristen N, DO 06/30/18 [phone]

## 2018-06-30 NOTE — ED Notes (Signed)
See triage nujrse note and the edp  Alert  Oriented no distress.  Pt was dc/d beofre ik had seen bher

## 2018-06-30 NOTE — Progress Notes (Addendum)
Left lower extremity venous duplex completed - Preliminary results - There is no evidence of a DVT or Baker's cyst. Toma DeitersVirginia Richanda Darin, RVS 06/30/2018, 10:08 am

## 2018-07-21 ENCOUNTER — Telehealth: Payer: Self-pay | Admitting: Cardiovascular Disease

## 2018-07-21 DIAGNOSIS — I6523 Occlusion and stenosis of bilateral carotid arteries: Secondary | ICD-10-CM

## 2018-07-21 DIAGNOSIS — I519 Heart disease, unspecified: Secondary | ICD-10-CM

## 2018-07-21 DIAGNOSIS — I5189 Other ill-defined heart diseases: Secondary | ICD-10-CM

## 2018-07-21 NOTE — Telephone Encounter (Signed)
She has documented carotid plaque of 50% or less previous evaluation.  Can reorder to reassess any progression or wait until follow-up evaluation to determine if follow-up study is necessary presently.

## 2018-07-21 NOTE — Telephone Encounter (Signed)
SPOKE TO PATIENT.  INFORMED PATIENT  WILL BE ABLE TO SCHEDULE CAROTID.    APPOINTMENT SCHEDULE FOR 07/28/18 AT 3:30 PM. - PATIENT AWARE .

## 2018-07-21 NOTE — Telephone Encounter (Signed)
New Message:    Pt says she usually have a Carotid Doppler every year. She would like for Dr Tresa Endo to order it so she can have it here. She is scheduled to see him on 08-03-18.

## 2018-07-21 NOTE — Telephone Encounter (Signed)
Spoke to patient. She has been been having carotid for the last  5 years. Informed her Dr Sudie Bailey order carotid 2018-  Will needed an order for a 2019  Carotid if needed. Patient aware will defer to Dr Tresa Endo and contact her back.

## 2018-07-25 ENCOUNTER — Other Ambulatory Visit (HOSPITAL_COMMUNITY): Payer: Self-pay | Admitting: Family Medicine

## 2018-07-25 DIAGNOSIS — Z780 Asymptomatic menopausal state: Secondary | ICD-10-CM

## 2018-07-28 ENCOUNTER — Ambulatory Visit (HOSPITAL_COMMUNITY)
Admission: RE | Admit: 2018-07-28 | Discharge: 2018-07-28 | Disposition: A | Discharge status: Home / Self Care | Payer: Medicare Other | Source: Ambulatory Visit | Attending: Cardiology | Admitting: Cardiology

## 2018-07-28 ENCOUNTER — Other Ambulatory Visit (HOSPITAL_COMMUNITY): Payer: Medicare Other

## 2018-07-28 DIAGNOSIS — I6523 Occlusion and stenosis of bilateral carotid arteries: Secondary | ICD-10-CM | POA: Diagnosis present

## 2018-07-28 DIAGNOSIS — I5189 Other ill-defined heart diseases: Secondary | ICD-10-CM

## 2018-07-28 DIAGNOSIS — I519 Heart disease, unspecified: Secondary | ICD-10-CM | POA: Diagnosis present

## 2018-08-02 ENCOUNTER — Ambulatory Visit (HOSPITAL_COMMUNITY)
Admission: RE | Admit: 2018-08-02 | Discharge: 2018-08-02 | Disposition: A | Discharge status: Home / Self Care | Payer: Medicare Other | Source: Ambulatory Visit | Attending: Family Medicine | Admitting: Family Medicine

## 2018-08-02 DIAGNOSIS — Z780 Asymptomatic menopausal state: Secondary | ICD-10-CM

## 2018-08-02 DIAGNOSIS — M85851 Other specified disorders of bone density and structure, right thigh: Secondary | ICD-10-CM | POA: Diagnosis not present

## 2018-08-03 ENCOUNTER — Encounter: Payer: Self-pay | Admitting: Cardiovascular Disease

## 2018-08-03 ENCOUNTER — Ambulatory Visit (INDEPENDENT_AMBULATORY_CARE_PROVIDER_SITE_OTHER): Payer: Medicare Other | Admitting: Cardiovascular Disease

## 2018-08-03 VITALS — BP 128/60 | HR 72 | Ht 64.0 in | Wt 124.0 lb

## 2018-08-03 DIAGNOSIS — E785 Hyperlipidemia, unspecified: Secondary | ICD-10-CM

## 2018-08-03 DIAGNOSIS — I5189 Other ill-defined heart diseases: Secondary | ICD-10-CM

## 2018-08-03 DIAGNOSIS — R002 Palpitations: Secondary | ICD-10-CM

## 2018-08-03 DIAGNOSIS — I519 Heart disease, unspecified: Secondary | ICD-10-CM | POA: Diagnosis not present

## 2018-08-03 DIAGNOSIS — M4716 Other spondylosis with myelopathy, lumbar region: Secondary | ICD-10-CM | POA: Diagnosis not present

## 2018-08-03 DIAGNOSIS — I10 Essential (primary) hypertension: Secondary | ICD-10-CM | POA: Diagnosis not present

## 2018-08-03 DIAGNOSIS — I6523 Occlusion and stenosis of bilateral carotid arteries: Secondary | ICD-10-CM

## 2018-08-03 NOTE — Patient Instructions (Signed)
Medication Instructions:  Your physician recommends that you continue on your current medications as directed. Please refer to the Current Medication list given to you today.  If you need a refill on your cardiac medications before your next appointment, please call your pharmacy.   Follow-Up: At [facility], you and your health needs are our priority.  As part of our continuing mission to provide you with exceptional heart care, we have created designated Provider Care Teams.  These Care Teams include your primary Cardiologist (physician) and Advanced Practice Providers (APPs -  Physician Assistants and Nurse Practitioners) who all work together to provide you with the care you need, when you need it. You will need a follow up appointment in 1 years.  Please call our office 2 months in advance to schedule this appointment.  You may see Nicki Guadalajara, MD or one of the following Advanced Practice Providers on your designated Care Team: [place], [place] . Micah Flesher, PA-C

## 2018-08-03 NOTE — Progress Notes (Signed)
Cardiology Office Note    Date:  08/03/2018   ID:  BRIT CARBONELL, DOB 10-15-1945, MRN [id number]  PCP:  Lemmie Evens, MD  Cardiologist:  Shelva Majestic, MD    History of Present Illness:  Sandra Henry is a 72 y.o. female who presents for a 12  month follow-up cardiology evaluation.   Ms. 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  She has a history of hypertension  She has a history of hyperlipidemia and had been taking Crestor 10 mg 3 times per week.  In October 2017, she developed symptoms of vertigo associated with nausea or vomiting and ultimately was evaluated by Dr. Ivan Croft of ENT, as well as by Dr. Floyde Parkins neurology.  She was diagnosed as having migraines headaches.  She was started on meclizine.  On November 30.  She was awakened from sleep at around 5 AM with palpitations which persisted.  She ultimately presented to the [facility] emergency room where she was found to have tachycardia.  Her ECG reports sinus tachycardia with PACs, but by my review, the EKG shows a at topical low atrial tachycardia, etiology rather than sinus tachycardia.  At that time, she was advised to stop losartan, which had been 25 mg and to initiate Toprol-XL 25 mg daily.  On this therapy, she does note improvement.  However, occasionally she had awakened in the very early morning and has noticed a jittery sensation in her heart rate.  She denied any episodes of chest pain.  She denies any awareness of sleep apnea.  According to her husband, however, she does snore mildly.  She typically gets up one night 1 time per night.  She denied significant nocturia.  She denied excessive daytime sleepiness.    When I initially saw her, I increased her Toprol-XL to 25 mg in the morning and evening.  An echo Doppler study, an EF of 55-60%.  There was grade 2 diastolic dysfunction.  There was mild AR and showed evidence for mitral annular calcification with  trivial MR.  She underwent laboratory.  Renal function was normal.  Left use were normal.  Lipid studies revealed a total cholesterol 172, triglycerides 161, HDL 39, increased VLDL at 32, and LDL at 101.   When I saw her in 2018  she had continued to feel well with reference to palpitations and had not experienced any since initiating Toprol.  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  She had antegrade flow and normal waveforms in her vertebrals.  She had undergone laboratory by Dr. Karie Kirks on 07/06/2017.  Total cholesterol was 152, triglycerides 110, HDL 46, and LDL 85.  She has continued to take rosuvastatin 10 mg.  She has an on Toprol-XL 37.5 mg in the morning and 25 mg in the evening.  She also is on losartan 50 mg twice a day, and has rarely taken HCTZ for leg swelling.    Since I last saw her in October 2018, she is continued to be stable from a cardiac standpoint.  She had had issues with low back discomfort and has seen Dr. Glenna Fellows on 3 occasions.  She has lumbar spondylosis with myelopathy and has been undergoing physical therapy.  There has been some improvement but if this does not significantly improve further she may ultimately require fusion surgery.  On July 29, 2018 she underwent a year  follow-up carotid Doppler study.  This remained stable and only showed very minimal plaque.  She had normal flow hemodynamics and subclavian as well as vertebral arteries.  She had laboratory on July 25, 2018 by Dr. Karie Kirks.  Total cholesterol was 146, HDL 42.  Triglycerides 122.  LDL cholesterol 82.  In the past she had been on rosuvastatin at 20 mg, reduce this down to 10 mg and recently has been able to tolerate 15 mg daily.  She had developed some mild discomfort in the left calf region and apparently had Doppler study done several weeks back which was negative for DVT.  She denies chest  pain PND palpitations presyncope or syncope.  She is sleeping well.  She presents for yearly evaluation.   Past Medical History:  Diagnosis Date  . Dental bridge present    upper  . Dental crowns present   . Dizziness and giddiness   . Glaucoma   . Hypercalcemia   . Hypertension    under control with med., has been on med. x 4 yr.  . Migraine 07/25/2014  . Palpitations   . Seasonal allergies   . 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 BREAST BIOPSY Left   . CATARACT EXTRACTION W/ INTRAOCULAR LENS IMPLANT Bilateral   . TRIGGER FINGER RELEASE Right 02/20/2014   Procedure: RELEASE A-1 PULLEY RIGHT INDEX,  MIDDLE AND RING FINGERS ;  Surgeon: Wynonia Sours, MD;  Location: [place];  Service: Orthopedics;  Laterality: Right;  . TUBAL LIGATION     laparoscopic    Current Medications: Outpatient Medications Prior to Visit  Medication Sig Dispense Refill  . Ascorbic Acid (VITAMIN C) 1000 MG tablet Take 1,000 mg by mouth daily.    Marland Kitchen aspirin 81 MG tablet Take 81 mg by mouth daily.    . Calcium-Magnesium-Vitamin D [id number] MG-MG-UNIT TB24 Take 1 tablet by mouth 2 (two) times daily.    . cholecalciferol (VITAMIN D) 400 UNITS TABS tablet Take 400 Units by mouth 2 (two) times daily.    . Garlic 10 MG CAPS Take 1 capsule by mouth daily.    . Glucosamine-Chondroitin (GLUCOSAMINE CHONDR COMPLEX) 500-400 MG CAPS Take 1 tablet by mouth 2 (two) times daily. 1500 - 200    . hydrochlorothiazide (MICROZIDE) 12.5 MG capsule Take 12.5 mg by mouth daily as needed.     . latanoprost (XALATAN) 0.005 % ophthalmic solution     . LORazepam (ATIVAN) 1 MG tablet Take 0.5 mg by mouth 2 (two) times daily.    Marland Kitchen losartan (COZAAR) 50 MG tablet Take 50 mg by mouth 2 (two) times daily.  3  . meclizine (ANTIVERT) 25 MG tablet Take 25 mg by mouth 4 (four) times daily as needed for dizziness.    .  metoprolol succinate (TOPROL-XL) 25 MG 24 hr tablet Take by mouth 2 (two) times daily. Pt takes 1.5 tablet in the morning and one tablet in the evening    . Multiple Vitamin (MULTIVITAMIN) tablet Take 1 tablet by mouth daily.    . Omega-3 Fatty Acids (FISH OIL) 1000 MG CAPS Take by mouth daily.    . rosuvastatin (CRESTOR) 20 MG tablet Take 1 tablet (20 mg total) by mouth daily after breakfast. 90 tablet 3  . timolol (BETIMOL) 0.25 % ophthalmic solution Place 1 drop into both eyes 2 (two) times daily.    Marland Kitchen  loteprednol (LOTEMAX) 0.5 % ophthalmic suspension Place 1 drop into both eyes daily.      No facility-administered medications prior to visit.      Allergies:   Codeine; Penicillins; and Sulfa antibiotics   Social History   Socioeconomic History  . Marital status: Married    Spouse name: Not on file  . Number of children: Not on file  . Years of education: Not on file  . Highest education level: Not on file  Occupational History  . Occupation: Biomedical scientist: [employer]  . Financial resource strain: Not on file  . Food insecurity:    Worry: Not on file    Inability: Not on file  . Transportation needs:    Medical: Not on file    Non-medical: Not on file  Tobacco Use  . Smoking status: Never Smoker  . Smokeless tobacco: Never Used  Substance and Sexual Activity  . Alcohol use: No  . Drug use: No  . Sexual activity: Not on file  Lifestyle  . Physical activity:    Days per week: Not on file    Minutes per session: Not on file  . Stress: Not on file  Relationships  . Social connections:    Talks on phone: Not on file    Gets together: Not on file    Attends religious service: Not on file    Active member of club or organization: Not on file    Attends meetings of clubs or organizations: Not on file    Relationship status: Not on file  Other Topics Concern  . Not on file  Social History Narrative   Lives at home w/ her husband    Left-handed   Caffeine: occasional soft drink     Family History:  The patient's family history includes Heart attack in her brother; Migraines in her mother; Stroke in her father, mother, and sister.   ROS General: Negative; No fevers, chills, or night sweats;  HEENT: Negative; No changes in vision or hearing, sinus congestion, difficulty swallowing Pulmonary: Negative; No cough, wheezing, shortness of breath, hemoptysis Cardiovascular: Negative; No chest pain, presyncope, syncope, palpitations GI: Negative; No nausea, vomiting, diarrhea, or abdominal pain GU: Negative; No dysuria, hematuria, or difficulty voiding Musculoskeletal: Lumbar disc disease with myelopathy Hematologic/Oncology: Negative; no easy bruising, bleeding Endocrine: Negative; no heat/cold intolerance; no diabetes Neuro: positive for recent diagnosis of migraine headaches and vertigo Skin: Negative; No rashes or skin lesions Psychiatric: Negative; No behavioral problems, depression Sleep: Negative; No snoring, daytime sleepiness, hypersomnolence, bruxism, restless legs, hypnogognic hallucinations, no cataplexy Other comprehensive 14 point system review is negative.   PHYSICAL EXAM:   VS:  BP 128/60 (BP Location: Right Arm, Patient Position: Sitting)   Pulse 72   Ht _0  (1.626 m)   Wt 124 lb (56.2 kg)   BMI 21.28 kg/m     Repeat blood pressure by me 118/70  Wt Readings from Last 3 Encounters:  08/03/18 124 lb (56.2 kg)  07/15/17 130 lb 3.2 oz (59.1 kg)  11/12/16 134 lb (60.8 kg)    General: Alert, oriented, no distress.  Skin: normal turgor, no rashes, warm and dry HEENT: Normocephalic, atraumatic. Pupils equal round and reactive to light; sclera anicteric; extraocular muscles intact;  Nose without nasal septal hypertrophy Mouth/Parynx benign; Mallinpatti scale 2/2 Neck: No JVD, no carotid bruits; normal carotid upstroke Lungs: clear to ausculatation and percussion; no wheezing or rales Chest wall:  without tenderness to  palpitation Heart: PMI not displaced, RRR, s1 s2 normal, 1/6 systolic murmur, no diastolic murmur, no rubs, gallops, thrills, or heaves Abdomen: soft, nontender; no hepatosplenomehaly, BS+; abdominal aorta nontender and not dilated by palpation. Back: no CVA tenderness Pulses 2+ Musculoskeletal: full range of motion, normal strength, no joint deformities Extremities: no clubbing cyanosis or edema, Homan's sign negative  Neurologic: grossly nonfocal; Cranial nerves grossly wnl Psychologic: Normal mood and affect    Studies/Labs Reviewed:   ECG (independently read by me): Normal sinus rhythm at 72 bpm.  No ectopy.  Normal intervals.  October 2018 ECG (independently read by me): Normal sinus rhythm at 63 bpm.  No ST segment changes.  Normal intervals.  February 2018 ECG (independently read by me): Normal sinus rhythm at 75 bpm.  Normal intervals.  Nonspecific ST changes.  December 2017 ECG (independently read by me): Normal sinus rhythm at 77 bpm.  PR interval 174 ms, QTc interval 432 ms.  I personally reviewed the ECG from 09/10/2016, and in contrast to the report which reads sinus tachycardia with PACs, the patient is in a low atrial ectopic tachycardic rhythm and then developed sinus rhythm after a pause followed by recurrent low atrial rhythm.  There are PACs.  Recent Labs: BMP Latest Ref Rng & Units 10/13/2016 09/10/2016 07/12/2016  Glucose 65 - 99 mg/dL 80 93 118(H)  BUN 7 - 25 mg/dL 18 28(H) 22(H)  Creatinine 0.60 - 0.93 mg/dL 0.78 0.95 0.93  Sodium 135 - 146 mmol/L 140 141 140  Potassium 3.5 - 5.3 mmol/L 4.1 4.0 3.7  Chloride 98 - 110 mmol/L 107 109 106  CO2 20 - 31 mmol/L _0 Calcium 8.6 - 10.4 mg/dL 9.6 10.0 10.1     Hepatic Function Latest Ref Rng & Units 10/13/2016 09/10/2016 07/12/2016  Total Protein 6.1 - 8.1 g/dL 7.3 6.9 7.7  Albumin 3.6 - 5.1 g/dL 4.1 3.6 3.4(L)  AST 10 - 35 U/L _1 ALT 6 - 29 U/L 17 20 12(L)  Alk Phosphatase 33 - 130  U/L 46 54 64  Total Bilirubin 0.2 - 1.2 mg/dL 0.4 0.2(L) 0.1(L)    CBC Latest Ref Rng & Units 09/10/2016 09/07/2016 07/12/2016  WBC 4.0 - 10.5 K/uL 7.3 6.0 7.6  Hemoglobin 12.0 - 15.0 g/dL 13.5 13.2 12.7  Hematocrit 36.0 - 46.0 % 41.0 39.2 38.9  Platelets 150 - 400 K/uL 330 332 584(H)   Lab Results  Component Value Date   MCV 93.4 09/10/2016   MCV 94.0 09/07/2016   MCV 95.3 07/12/2016   Lab Results  Component Value Date   TSH 3.661 09/10/2016   No results found for: HGBA1C   BNP No results found for: BNP  ProBNP No results found for: PROBNP   Lipid Panel     Component Value Date/Time   CHOL 172 10/13/2016 0852   TRIG 161 (H) 10/13/2016 0852   HDL 39 (L) 10/13/2016 0852   CHOLHDL 4.4 10/13/2016 0852   VLDL 32 (H) 10/13/2016 0852   LDLCALC 101 (H) 10/13/2016 4098     Lipid study obtained in Dr. Vickey Sages office on 03/26/2016:  Total cholesterol 186, HDL 48, triglycerides 107, LDL 117.  RADIOLOGY: Dg Bone Density (dxa)  Result Date: 08/02/2018 EXAM: DUAL X-RAY ABSORPTIOMETRY (DXA) FOR BONE MINERAL DENSITY IMPRESSION: Ordering Physician:  Dr. Lemmie Evens, Your patient Raven Furnas completed a BMD test on 08/02/2018 using the [place] (software version: 14.10) manufactured by [employer]. The following  summarizes the results of our evaluation. Technologist: [facility] PATIENT BIOGRAPHICAL: Name: DHRUVI, CRENSHAW Patient ID: [id number] Birth Date: 12-May-1946 Height: 63.0 in. Gender: Female Exam Date: 08/02/2018 Weight: 127.0 lbs. Indications: Caucasian, Follow up Osteopenia, Height Loss, History of Fracture (Adult), Low Body Weight, Post Menopausal Fractures: Foot Treatments: Asprin, Calcium, Multivitamin, Vitamin D DENSITOMETRY RESULTS: Site          Region     Measured Date Measured Age WHO Classification Young Adult T-score BMD         %Change vs. Previous Significant Change (*) DualFemur Neck Right 08/02/2018 73.0 Osteopenia -1.6 0.822 g/cm2  -0.4% - DualFemur Neck Right 01/29/2015 69.5 Osteopenia -1.5 0.825 g/cm2 0.0% - DualFemur Neck Right 02/19/2012 66.5 Osteopenia -1.5 0.825 g/cm2 - - DualFemur Total Mean 08/02/2018 73.0 Normal -0.8 0.913 g/cm2 0.2% - DualFemur Total Mean 01/29/2015 69.5 - - 0.911 g/cm2 2.7% Yes DualFemur Total Mean 02/19/2012 66.5 - - 0.887 g/cm2 - - Right Forearm Radius 33% 08/02/2018 73.0 Osteopenia -2.1 0.566 g/cm2 - - ASSESSMENT: The BMD measured at Forearm Radius 33% is 0.566 g/cm2 with a T-score of -2.1. This patient is considered osteopenic according to [facility]) criteria. The scan quality is good. Compared with the prior study on 01/29/2015, the BMD of the total mean shows no statistically significant change. Lumbar spine was excluded due to advanced degenerative changes. World Pharmacologist [facility]) criteria for post-menopausal, Caucasian Women: Normal:       T-score at or above -1 SD Osteopenia:   T-score between -1 and -2.5 SD Osteoporosis: T-score at or below -2.5 SD RECOMMENDATIONS: 1. All patients should optimize calcium and vitamin D intake. 2. Consider FDA-approved medical therapies in postmenopausal women and med aged 15 years and older, based on the following: a. A hip or vertebral (clinical or morphometric) fracture b. T-score< -2.5 at the femoral neck or spine after appropriate evaluation to exclude secondary causes c. Low bone mass (T-score between -1.0 and -2.5 at the femoral neck or spine) and a 10-year probability of a hip fracture > 3% or a 10-year probability of a major osteoporosis-related fracture > 20% based on the [place]-adapted WHO algorithm d. Clinician judgment and/or patient preferences may indicate treatment for people with 10-year fracture probabilities above or below these levels FOLLOW-UP: People with diagnosed cases of osteoporosis or at high risk for fracture should have regular bone mineral density tests. For patients eligible for Medicare, routine testing is allowed once  every 2 years. The testing frequency can be increased to one year for patients who have rapidly progressing disease, those who are receiving or discontinuing medical therapy to restore bone mass, or have additional risk factors. I have reviewed this report, and agree with the above findings. [facility] Your patient Lakeline completed a FRAX assessment on 08/02/2018 using the [place] (analysis version: 14.10) manufactured by [employer]. The following summarizes the results of our evaluation. PATIENT BIOGRAPHICAL: Name: BAYLA, MCGOVERN Patient ID: [id number] Birth Date: Jun 25, 1946 Height:    63.0 in. Gender:     Female    [age]       Weight:    127.0 lbs. Ethnicity:  White                            Exam Date: 08/02/2018 FRAX* RESULTS:  (version: 3.5) 10-year Probability of Fracture1 Major Osteoporotic Fracture2 Hip Fracture 15.9% 2.8%  Population: [place] (Caucasian) Risk Factors: History of Fracture (Adult) Based on Femur (Right) Neck BMD 1 -The 10-year probability of fracture may be lower than reported if the patient has received treatment. 2 -Major Osteoporotic Fracture: Clinical Spine, Forearm, Hip or Shoulder *FRAX is a Materials engineer of the [employer] for Metabolic Bone Disease, a [place] (WHO) [facility]. ASSESSMENT: The probability of a major osteoporotic fracture is 15.9% within the next ten years. The probability of a hip fracture is 2.8% within the next ten years. Electronically Signed   By: Lowella Grip III M.D.   On: 08/02/2018 09:36     Additional studies/ records that were reviewed today include:  I reviewed the patient's emergency room evaluations.  I have also reviewed Dr. Jannifer Franklin is neurologic evaluation and Dr. Vickey Sages notes.  I reviewed her recent laboratory from 07/06/2017.  I reviewed the records of Dr. Glenna Fellows.  I reviewed recent laboratory and her carotid  studies.  ASSESSMENT:    1. Lumbar spondylosis with myelopathy   2. Essential hypertension, benign   3. Heart palpitations   4. Hyperlipidemia with target LDL less than 70   5. Grade II diastolic dysfunction     PLAN:  Ms. Adrian Specht is a 72 year old female who has a history of hypertension, hyperlipidemia, and  had developed episodes of palpitations and increased heart rate.  I initially saw her, she had noticed her episodes at approximately 5 AM in the morning, which raised the possibility of obstructive sleep apnea contributing  to her symptomatology.  Her ECG from her emergency room evaluation  suggested ectopic atrial tachycardia rather than sinus tachycardia.  Symptoms have significantly improved with initiation of Toprol-XL which was titrated up to Toprol to 37.5 mg twice a day.  On this present regimen, she is not having any palpitations.  Her blood pressure today is well controlled on losartan 50 mg twice a day in addition to her Toprol.  She has a prescription for HCTZ but rarely takes this and has not had any significant edema.  On echo Doppler assessment EF was 55 to 60% with grade 2 diastolic dysfunction.  She denies any significant dyspnea.  She developed some myalgias on Crestor 20 mg but seems to tolerate the 15 mg dose.  Her most recent lipid studies show a total cholesterol 146 HDL 42 triglycerides 122 and LDL at 82.  Her most recent carotid study was reviewed and this remains stable.  I reviewed the records of Dr. Carloyn Manner.  Hopefully she will continue to benefit from physical therapy for her lumbar spondylosis with myelopathy.  If future surgery is necessary she will be given clearance from a cardiac standpoint.  As long as she remains stable I will see her in 1 year for follow-up evaluation or sooner if problems arise   Medication Adjustments/Labs and Tests Ordered: Current medicines are reviewed at length with the patient today.  Concerns regarding medicines are outlined above.   Medication changes, Labs and Tests ordered today are listed in the Patient Instructions below.  Patient Instructions  Medication Instructions:  Your physician recommends that you continue on your current medications as directed. Please refer to the Current Medication list given to you today.  If you need a refill on your cardiac medications before your next appointment, please call your pharmacy.   Follow-Up: At [facility], you and your health needs are our priority.  As part of our continuing mission to provide you with exceptional heart  care, we have created designated Provider Care Teams.  These Care Teams include your primary Cardiologist (physician) and Advanced Practice Providers (APPs -  Physician Assistants and Nurse Practitioners) who all work together to provide you with the care you need, when you need it. You will need a follow up appointment in 1 years.  Please call our office 2 months in advance to schedule this appointment.  You may see Shelva Majestic, MD or one of the following Advanced Practice Providers on your designated Care Team: [place], [place] . Fabian Sharp, PA-C     Signed, Shelva Majestic, MD  08/03/2018 8:41 AM    [facility] [address], [place], [place], [place]  [address] Phone: [phone]

## 2018-08-25 ENCOUNTER — Encounter: Payer: Self-pay | Admitting: *Deleted

## 2018-10-24 IMAGING — US US EXTREM LOW VENOUS*L*
1 series · 13 of 24 positions shown · non-contrast
Comparison: None.

CLINICAL DATA: Left lower extremity pain and edema for the past
week. Evaluate for DVT.



[Series 1: us extrem low venous*left* · 0.07mm/px · 13 of 34 slices shown]
[im 1/34]
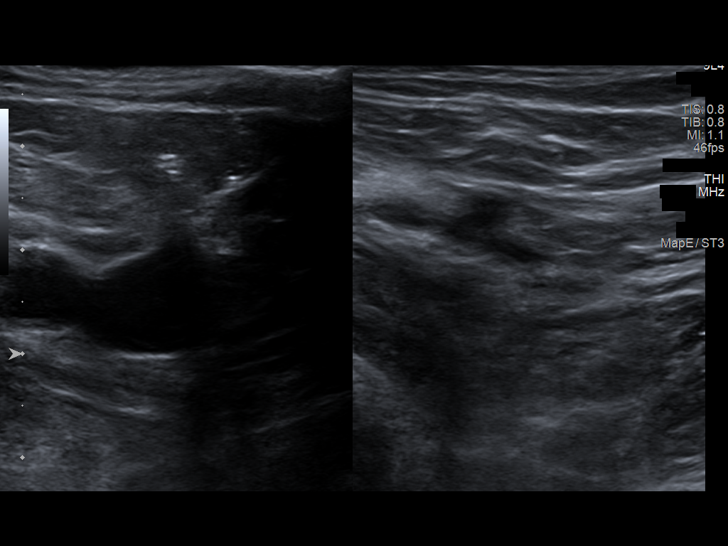
[im 3/34]
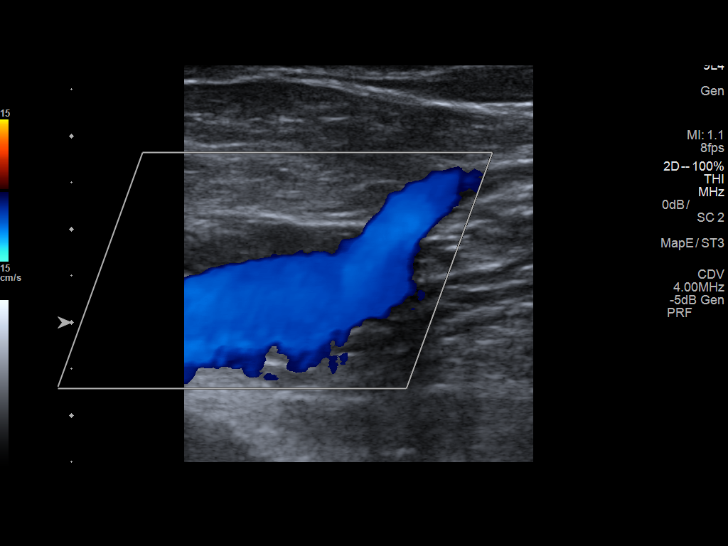
[im 6/34]
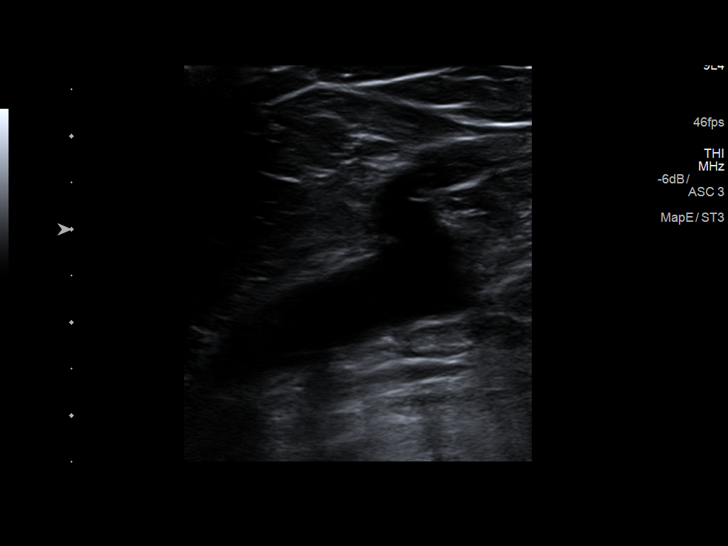
[im 9/34]
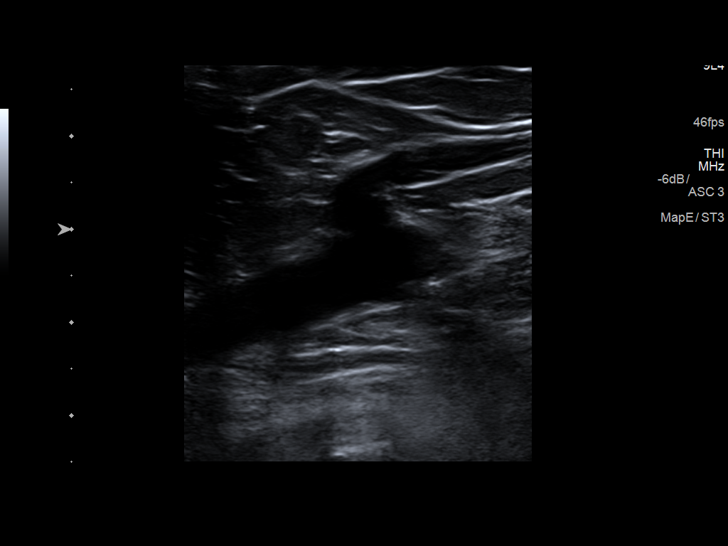
[im 12/34]
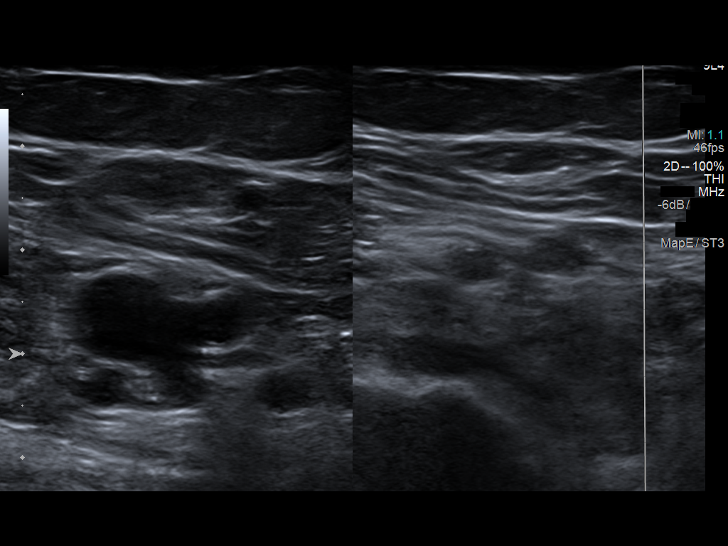
[im 15/34]
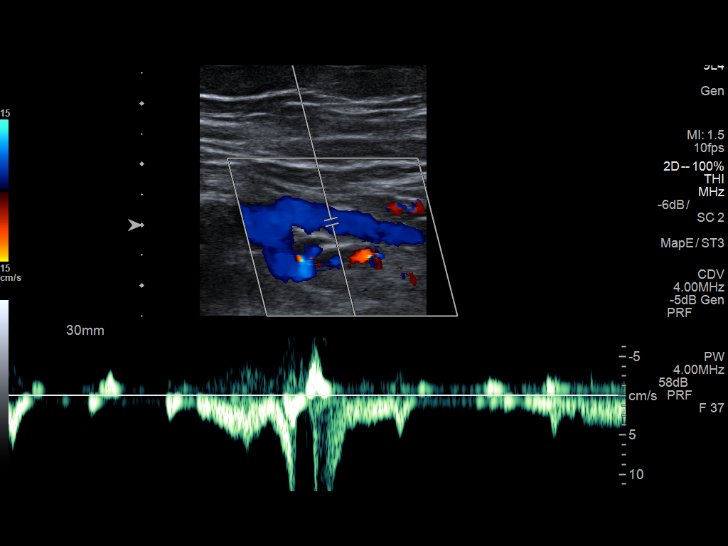
[im 18/34]
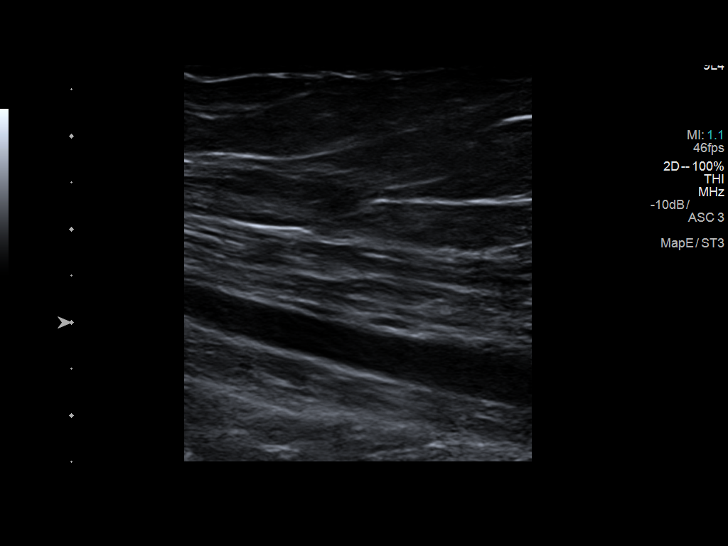
[im 19/34]
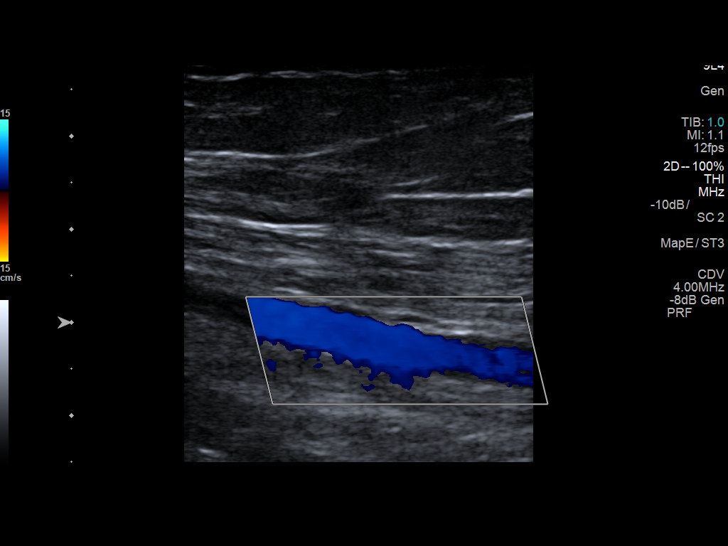
[im 22/34]
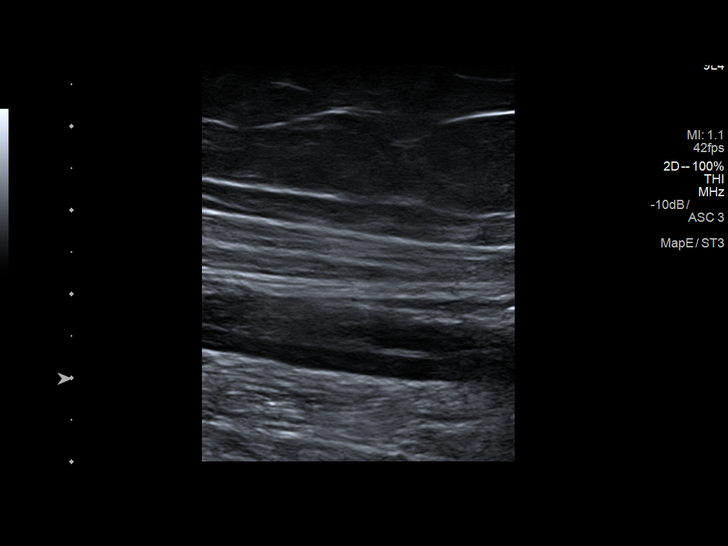
[im 25/34]
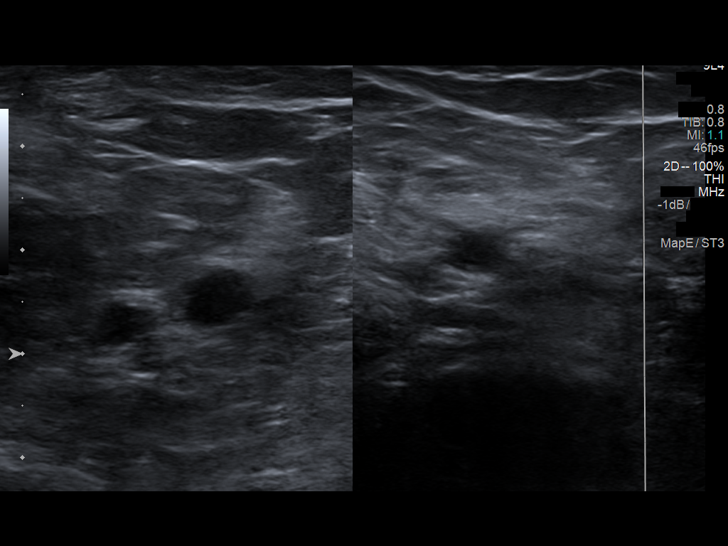
[im 28/34]
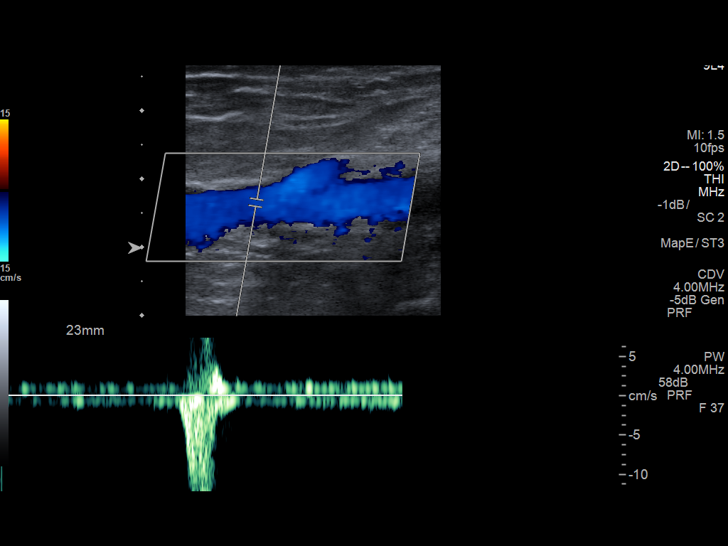
[im 31/34]
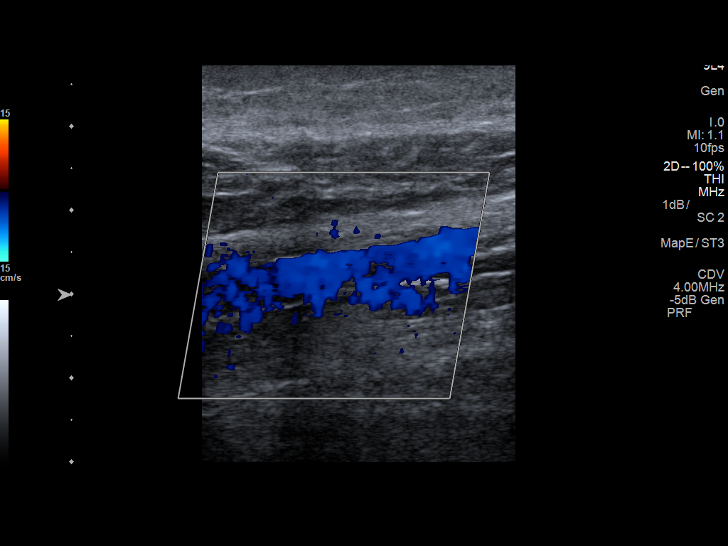
[im 34/34]
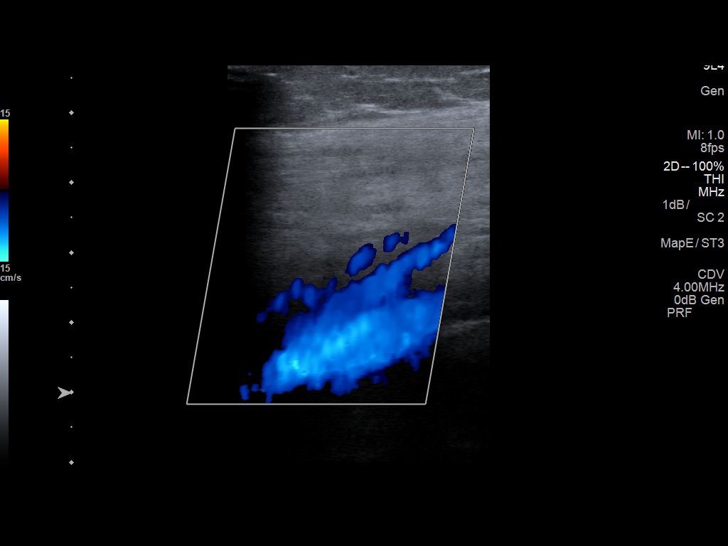

[13 of 24 positions shown; findings below may reference images not displayed]

FINDINGS: Contralateral Common Femoral Vein: Respiratory phasicity is normal
and symmetric with the symptomatic side. No evidence of thrombus.
Normal compressibility.

Common Femoral Vein: No evidence of thrombus. Normal
compressibility, respiratory phasicity and response to augmentation.

Saphenofemoral Junction: No evidence of thrombus. Normal
compressibility and flow on color Doppler imaging.

Profunda Femoral Vein: No evidence of thrombus. Normal
compressibility and flow on color Doppler imaging.

Femoral Vein: No evidence of thrombus. Normal compressibility,
respiratory phasicity and response to augmentation.

Popliteal Vein: No evidence of thrombus. Normal compressibility,
respiratory phasicity and response to augmentation.

Calf Veins: No evidence of thrombus. Normal compressibility and flow
on color Doppler imaging.

Superficial Great Saphenous Vein: No evidence of thrombus. Normal
compressibility.

Venous Reflux:  None.

Other Findings:  None.
IMPRESSION: No evidence of DVT within the left lower extremity.

## 2018-11-29 ENCOUNTER — Other Ambulatory Visit (HOSPITAL_COMMUNITY): Payer: Self-pay | Admitting: Family Medicine

## 2018-11-29 ENCOUNTER — Other Ambulatory Visit: Payer: Self-pay | Admitting: Family Medicine

## 2018-11-29 ENCOUNTER — Ambulatory Visit (HOSPITAL_COMMUNITY)
Admission: RE | Admit: 2018-11-29 | Discharge: 2018-11-29 | Disposition: A | Discharge status: Home / Self Care | Payer: Medicare Other | Source: Ambulatory Visit | Attending: Family Medicine | Admitting: Family Medicine

## 2018-11-29 DIAGNOSIS — M79605 Pain in left leg: Secondary | ICD-10-CM

## 2019-03-28 ENCOUNTER — Other Ambulatory Visit: Payer: Self-pay | Admitting: Family Medicine

## 2019-03-28 DIAGNOSIS — Z1231 Encounter for screening mammogram for malignant neoplasm of breast: Secondary | ICD-10-CM

## 2019-04-19 ENCOUNTER — Other Ambulatory Visit: Payer: Self-pay | Admitting: *Deleted

## 2019-04-19 DIAGNOSIS — Z20822 Contact with and (suspected) exposure to covid-19: Secondary | ICD-10-CM

## 2019-04-24 LAB — NOVEL CORONAVIRUS, NAA: SARS-CoV-2, NAA: NOT DETECTED

## 2019-05-09 ENCOUNTER — Other Ambulatory Visit: Payer: Self-pay

## 2019-05-09 ENCOUNTER — Ambulatory Visit
Admission: RE | Admit: 2019-05-09 | Discharge: 2019-05-09 | Disposition: A | Discharge status: Home / Self Care | Payer: Medicare Other | Source: Ambulatory Visit | Attending: Family Medicine | Admitting: Family Medicine

## 2019-05-09 DIAGNOSIS — Z1231 Encounter for screening mammogram for malignant neoplasm of breast: Secondary | ICD-10-CM

## 2019-05-30 ENCOUNTER — Telehealth: Payer: Self-pay | Admitting: Cardiovascular Disease

## 2019-05-30 DIAGNOSIS — I6529 Occlusion and stenosis of unspecified carotid artery: Secondary | ICD-10-CM

## 2019-05-30 DIAGNOSIS — I25119 Atherosclerotic heart disease of native coronary artery with unspecified angina pectoris: Secondary | ICD-10-CM

## 2019-05-30 NOTE — Telephone Encounter (Signed)
New message:    Patient calling stating she need a order for a doppler. Please call patient.

## 2019-05-30 NOTE — Telephone Encounter (Signed)
Left message for pt to call.

## 2019-05-31 NOTE — Telephone Encounter (Signed)
Returned call to pt she states that she is due for her carotid US in October. She has appt and thinks that Dr Claiborne Billings just forgot to order it. She has this done annually. She states that she can have PCp or if needed. I will enter annual order. She will be expecting a call from scheduling. Order entered. TK nurse notified

## 2019-08-02 ENCOUNTER — Ambulatory Visit (HOSPITAL_COMMUNITY)
Admission: RE | Admit: 2019-08-02 | Discharge: 2019-08-02 | Disposition: A | Discharge status: Home / Self Care | Payer: Medicare Other | Source: Ambulatory Visit | Attending: Internal Medicine | Admitting: Internal Medicine

## 2019-08-02 ENCOUNTER — Other Ambulatory Visit: Payer: Self-pay

## 2019-08-02 DIAGNOSIS — I25119 Atherosclerotic heart disease of native coronary artery with unspecified angina pectoris: Secondary | ICD-10-CM | POA: Insufficient documentation

## 2019-08-02 DIAGNOSIS — I6529 Occlusion and stenosis of unspecified carotid artery: Secondary | ICD-10-CM | POA: Insufficient documentation

## 2019-08-24 ENCOUNTER — Other Ambulatory Visit: Payer: Self-pay

## 2019-08-24 ENCOUNTER — Ambulatory Visit (INDEPENDENT_AMBULATORY_CARE_PROVIDER_SITE_OTHER): Payer: Medicare Other | Admitting: Cardiovascular Disease

## 2019-08-24 ENCOUNTER — Encounter: Payer: Self-pay | Admitting: Cardiovascular Disease

## 2019-08-24 VITALS — BP 128/66 | HR 63 | Temp 97.6°F | Ht 64.0 in | Wt 132.0 lb

## 2019-08-24 DIAGNOSIS — E785 Hyperlipidemia, unspecified: Secondary | ICD-10-CM | POA: Diagnosis not present

## 2019-08-24 DIAGNOSIS — I5189 Other ill-defined heart diseases: Secondary | ICD-10-CM

## 2019-08-24 DIAGNOSIS — M4716 Other spondylosis with myelopathy, lumbar region: Secondary | ICD-10-CM

## 2019-08-24 DIAGNOSIS — I519 Heart disease, unspecified: Secondary | ICD-10-CM | POA: Diagnosis not present

## 2019-08-24 DIAGNOSIS — I6529 Occlusion and stenosis of unspecified carotid artery: Secondary | ICD-10-CM | POA: Diagnosis not present

## 2019-08-24 DIAGNOSIS — I10 Essential (primary) hypertension: Secondary | ICD-10-CM | POA: Diagnosis not present

## 2019-08-24 DIAGNOSIS — I471 Supraventricular tachycardia: Secondary | ICD-10-CM

## 2019-08-24 MED ORDER — AMLODIPINE BESYLATE 2.5 MG PO TABS: 2.5000 mg | ORAL_TABLET | Freq: Every day | ORAL | 3 refills | Status: DC

## 2019-08-24 NOTE — Progress Notes (Signed)
Cardiology Office Note    Date:  08/24/2019   ID:  Sandra Henry, DOB 12-Jan-1946, MRN [id number]  PCP:  Lemmie Evens, MD  Cardiologist:  Shelva Majestic, MD    History of Present Illness:  Sandra Henry is a 73 y.o. female who presents for a 12  month follow-up cardiology evaluation.   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  She has a history of hypertension  She has a history of hyperlipidemia and had been taking Crestor 10 mg 3 times per week.  In October 2017, she developed symptoms of vertigo associated with nausea or vomiting and ultimately was evaluated by Dr. Ivan Croft of ENT, as well as by Dr. Floyde Parkins neurology.  She was diagnosed as having migraines headaches.  She was started on meclizine.  On November 30.  She was awakened from sleep at around 5 AM with palpitations which persisted.  She ultimately presented to the [facility] emergency room where she was found to have tachycardia.  Her ECG reports sinus tachycardia with PACs, but by my review, the EKG shows a at topical low atrial tachycardia, etiology rather than sinus tachycardia.  At that time, she was advised to stop losartan, which had been 25 mg and to initiate Toprol-XL 25 mg daily.  On this therapy, she does note improvement.  However, occasionally she had awakened in the very early morning and has noticed a jittery sensation in her heart rate.  She denied any episodes of chest pain.  She denies any awareness of sleep apnea.  According to her husband, however, she does snore mildly.  She typically gets up one night 1 time per night.  She denied significant nocturia.  She denied excessive daytime sleepiness.    When I initially saw her, I increased her Toprol-XL to 25 mg in the morning and evening.  An echo Doppler study, an EF of 55-60%.  There was grade 2 diastolic dysfunction.  There was mild AR and showed evidence for mitral annular calcification with  trivial MR.  She underwent laboratory.  Renal function was normal.  Left use were normal.  Lipid studies revealed a total cholesterol 172, triglycerides 161, HDL 39, increased VLDL at 32, and LDL at 101.   When I saw her in October 2018  she had continued to feel well with reference to palpitations and had not experienced any since initiating Toprol.  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  She had antegrade flow and normal waveforms in her vertebrals.  She had undergone laboratory by Dr. Karie Kirks on 07/06/2017.  Total cholesterol was 152, triglycerides 110, HDL 46, and LDL 85.  She has continued to take rosuvastatin 10 mg.  She has an on Toprol-XL 37.5 mg in the morning and 25 mg in the evening.  She also is on losartan 50 mg twice a day, and has rarely taken HCTZ for leg swelling.    I last saw her in October 2019 at which time she was remaining stable from a cardiac standpoint.    She had had issues with low back discomfort and has seen Dr. Glenna Fellows on 3 occasions.  She has lumbar spondylosis with myelopathy and has been undergoing physical therapy.  There has been some improvement but if this does not significantly improve further she may ultimately require fusion surgery.  On July 29, 2018 she  underwent a year follow-up carotid Doppler study.  This remained stable and only showed very minimal plaque.  She had normal flow hemodynamics and subclavian as well as vertebral arteries.  She had laboratory on July 25, 2018 by Dr. Karie Kirks.  Total cholesterol was 146, HDL 42.  Triglycerides 122.  LDL cholesterol 82.  In the past she had been on rosuvastatin at 20 mg, reduce this down to 10 mg and recently has been able to tolerate 15 mg daily.  She had developed some mild discomfort in the left calf region and apparently had Doppler study done several weeks back which was negative for DVT.  She  denied chest pain PND palpitations presyncope or syncope.  She was sleeping well.    Since I last saw her 1 year ago, she is continue to remain stable.  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  Recently, she has started to notice her blood pressure in the early morning being elevated normal blood pressure remaining portion of the day.  She has been on a regimen consisting of losartan 50 mg twice a day, metoprolol succinate 37.5 mg twice a day.  She has a prescription for HCTZ but has not taken any since she has not had any edema.  He also has a history of hyperlipidemia and was supposed to be on rosuvastatin 20 mg.  However over the past 6 months she has only been taking 15 mg daily.  Recent laboratory done in [place] had shown an LDL cholesterol at 81.  Cholesterol was 147 triglycerides 127.  She had normal LFTs and renal function.  Hemoglobin hematocrit were stable.  She has been working with her husband who is a Theme park manager in [employer].  She presents for 1 year evaluation.  Past Medical History:  Diagnosis Date  . Dental bridge present    upper  . Dental crowns present   . Dizziness and giddiness   . Glaucoma   . Hypercalcemia   . Hypertension    under control with med., has been on med. x 4 yr.  . Migraine 07/25/2014  . Palpitations   . Seasonal allergies   . 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 BREAST BIOPSY Left   . CATARACT EXTRACTION W/ INTRAOCULAR LENS IMPLANT Bilateral   . TRIGGER FINGER RELEASE Right 02/20/2014   Procedure: RELEASE A-1 PULLEY RIGHT INDEX,  MIDDLE AND RING FINGERS ;  Surgeon: Wynonia Sours, MD;  Location: [facility];  Service: Orthopedics;  Laterality: Right;  . TUBAL LIGATION     laparoscopic    Current Medications: Outpatient Medications Prior to Visit   Medication Sig Dispense Refill  . Ascorbic Acid (VITAMIN C) 1000 MG tablet Take 1,000 mg by mouth daily.    Marland Kitchen aspirin 81 MG tablet Take 81 mg by mouth daily.    . Calcium-Magnesium-Vitamin D [id number] MG-MG-UNIT TB24 Take 1 tablet by mouth 2 (two) times daily.    . cholecalciferol (VITAMIN D) 400 UNITS TABS tablet Take 400 Units by mouth 2 (two) times daily.    . Garlic 10 MG CAPS Take 1 capsule by mouth daily.    . Glucosamine-Chondroitin (GLUCOSAMINE CHONDR COMPLEX) 500-400 MG CAPS Take 1 tablet by mouth 2 (two) times daily. 1500 - 200    . hydrochlorothiazide (MICROZIDE) 12.5 MG  capsule Take 12.5 mg by mouth daily as needed.     Marland Kitchen LORazepam (ATIVAN) 1 MG tablet Take 0.5 mg by mouth 2 (two) times daily.    Marland Kitchen losartan (COZAAR) 50 MG tablet Take 50 mg by mouth 2 (two) times daily.  3  . metoprolol succinate (TOPROL-XL) 25 MG 24 hr tablet Take 37.5 mg by mouth 2 (two) times daily.     . Multiple Vitamin (MULTIVITAMIN) tablet Take 1 tablet by mouth daily.    . Omega-3 Fatty Acids (FISH OIL) 1000 MG CAPS Take by mouth daily.    . rosuvastatin (CRESTOR) 20 MG tablet Take 1 tablet (20 mg total) by mouth daily after breakfast. 90 tablet 3  . timolol (BETIMOL) 0.25 % ophthalmic solution Place 1 drop into both eyes daily.     Marland Kitchen latanoprost (XALATAN) 0.005 % ophthalmic solution     . meclizine (ANTIVERT) 25 MG tablet Take 25 mg by mouth 4 (four) times daily as needed for dizziness.     No facility-administered medications prior to visit.      Allergies:   Codeine, Penicillins, and Sulfa antibiotics   Social History   Socioeconomic History  . Marital status: Married    Spouse name: Not on file  . Number of children: Not on file  . Years of education: Not on file  . Highest education level: Not on file  Occupational History  . Occupation: Biomedical scientist: [employer]  . Financial resource strain: Not on file  . Food insecurity    Worry: Not on file     Inability: Not on file  . Transportation needs    Medical: Not on file    Non-medical: Not on file  Tobacco Use  . Smoking status: Never Smoker  . Smokeless tobacco: Never Used  Substance and Sexual Activity  . Alcohol use: No  . Drug use: No  . Sexual activity: Not on file  Lifestyle  . Physical activity    Days per week: Not on file    Minutes per session: Not on file  . Stress: Not on file  Relationships  . Social Herbalist on phone: Not on file    Gets together: Not on file    Attends religious service: Not on file    Active member of club or organization: Not on file    Attends meetings of clubs or organizations: Not on file    Relationship status: Not on file  Other Topics Concern  . Not on file  Social History Narrative   Lives at home w/ her husband   Left-handed   Caffeine: occasional soft drink     Family History:  The patient's family history includes Heart attack in her brother; Migraines in her mother; Stroke in her father, mother, and sister.   ROS General: Negative; No fevers, chills, or night sweats;  HEENT: Negative; No changes in vision or hearing, sinus congestion, difficulty swallowing Pulmonary: Negative; No cough, wheezing, shortness of breath, hemoptysis Cardiovascular: Negative; No chest pain, presyncope, syncope, palpitations GI: Negative; No nausea, vomiting, diarrhea, or abdominal pain GU: Negative; No dysuria, hematuria, or difficulty voiding Musculoskeletal: Lumbar disc disease with myelopathy Hematologic/Oncology: Negative; no easy bruising, bleeding Endocrine: Negative; no heat/cold intolerance; no diabetes Neuro: positive for recent diagnosis of migraine headaches and vertigo Skin: Negative; No rashes or skin lesions Psychiatric: Negative; No behavioral problems, depression Sleep: Negative; No snoring, daytime sleepiness, hypersomnolence, bruxism, restless legs, hypnogognic  hallucinations, no cataplexy Other comprehensive 14  point system review is negative.   PHYSICAL EXAM:   VS:  BP 128/66 (BP Location: Left Arm, Patient Position: Sitting, Cuff Size: Normal)   Pulse 63   Temp 97.6 F (36.4 C)   Ht 5' 4"  (1.626 m)   Wt 132 lb (59.9 kg)   BMI 22.66 kg/m     Repeat blood pressure was 126/70  Wt Readings from Last 3 Encounters:  08/24/19 132 lb (59.9 kg)  08/03/18 124 lb (56.2 kg)  07/15/17 130 lb 3.2 oz (59.1 kg)    General: Alert, oriented, no distress.  Skin: normal turgor, no rashes, warm and dry HEENT: Normocephalic, atraumatic. Pupils equal round and reactive to light; sclera anicteric; extraocular muscles intact;  Nose without nasal septal hypertrophy Mouth/Parynx benign; Mallinpatti scale 2 Neck: No JVD, no carotid bruits; normal carotid upstroke Lungs: clear to ausculatation and percussion; no wheezing or rales Chest wall: without tenderness to palpitation Heart: PMI not displaced, RRR, s1 s2 normal, 1/6 systolic murmur, no diastolic murmur, no rubs, gallops, thrills, or heaves Abdomen: soft, nontender; no hepatosplenomehaly, BS+; abdominal aorta nontender and not dilated by palpation. Back: no CVA tenderness Pulses 2+ Musculoskeletal: full range of motion, normal strength, no joint deformities Extremities: no clubbing cyanosis or edema, Homan's sign negative  Neurologic: grossly nonfocal; Cranial nerves grossly wnl Psychologic: Normal mood and affect    Studies/Labs Reviewed:   ECG (independently read by me): NSR at 63; normal intervals, no ectopy  October 2019 ECG (independently read by me): Normal sinus rhythm at 72 bpm.  No ectopy.  Normal intervals.  October 2018 ECG (independently read by me): Normal sinus rhythm at 63 bpm.  No ST segment changes.  Normal intervals.  February 2018 ECG (independently read by me): Normal sinus rhythm at 75 bpm.  Normal intervals.  Nonspecific ST changes.  December 2017 ECG (independently read by me): Normal sinus rhythm at 77 bpm.  PR interval  174 ms, QTc interval 432 ms.  I personally reviewed the ECG from 09/10/2016, and in contrast to the report which reads sinus tachycardia with PACs, the patient is in a low atrial ectopic tachycardic rhythm and then developed sinus rhythm after a pause followed by recurrent low atrial rhythm.  There are PACs.  Recent Labs: BMP Latest Ref Rng & Units 10/13/2016 09/10/2016 07/12/2016  Glucose 65 - 99 mg/dL 80 93 118(H)  BUN 7 - 25 mg/dL 18 28(H) 22(H)  Creatinine 0.60 - 0.93 mg/dL 0.78 0.95 0.93  Sodium 135 - 146 mmol/L 140 141 140  Potassium 3.5 - 5.3 mmol/L 4.1 4.0 3.7  Chloride 98 - 110 mmol/L 107 109 106  CO2 20 - 31 mmol/L 26 25 24   Calcium 8.6 - 10.4 mg/dL 9.6 10.0 10.1     Hepatic Function Latest Ref Rng & Units 10/13/2016 09/10/2016 07/12/2016  Total Protein 6.1 - 8.1 g/dL 7.3 6.9 7.7  Albumin 3.6 - 5.1 g/dL 4.1 3.6 3.4(L)  AST 10 - 35 U/L 18 20 15   ALT 6 - 29 U/L 17 20 12(L)  Alk Phosphatase 33 - 130 U/L 46 54 64  Total Bilirubin 0.2 - 1.2 mg/dL 0.4 0.2(L) 0.1(L)    CBC Latest Ref Rng & Units 09/10/2016 09/07/2016 07/12/2016  WBC 4.0 - 10.5 K/uL 7.3 6.0 7.6  Hemoglobin 12.0 - 15.0 g/dL 13.5 13.2 12.7  Hematocrit 36.0 - 46.0 % 41.0 39.2 38.9  Platelets 150 - 400 K/uL 330 332 584(H)   Lab Results  Component Value Date   MCV 93.4 09/10/2016   MCV 94.0 09/07/2016   MCV 95.3 07/12/2016   Lab Results  Component Value Date   TSH 3.661 09/10/2016   No results found for: HGBA1C   BNP No results found for: BNP  ProBNP No results found for: PROBNP   Lipid Panel     Component Value Date/Time   CHOL 172 10/13/2016 0852   TRIG 161 (H) 10/13/2016 0852   HDL 39 (L) 10/13/2016 0852   CHOLHDL 4.4 10/13/2016 0852   VLDL 32 (H) 10/13/2016 0852   LDLCALC 101 (H) 10/13/2016 6767     Lipid study obtained in Dr. Vickey Sages office on 03/26/2016:  Total cholesterol 186, HDL 48, triglycerides 107, LDL 117.  RADIOLOGY: Vas US Carotid  Result Date: 08/02/2019 Carotid Arterial  Duplex Study Indications:       Patient denies any cerebrovascular symptoms, mild carotid                    disease f/u. Risk Factors:      No history of smoking. Comparison Study:  In 10/19, a carotid duplex showed RICA velocities of 73/16                    cm/sec and LICA velocities of 209/47 cm/sec. Performing Technologist: Mariane Masters RVT  Examination Guidelines: A complete evaluation includes B-mode imaging, spectral Doppler, color Doppler, and power Doppler as needed of all accessible portions of each vessel. Bilateral testing is considered an integral part of a complete examination. Limited examinations for reoccurring indications may be performed as noted.  Right Carotid Findings: +----------+--------+--------+--------+------------------+--------+           PSV cm/sEDV cm/sStenosisPlaque DescriptionComments +----------+--------+--------+--------+------------------+--------+ CCA Prox  107     9                                          +----------+--------+--------+--------+------------------+--------+ CCA Distal78      12                                         +----------+--------+--------+--------+------------------+--------+ ICA Prox  90      13      Normal                             +----------+--------+--------+--------+------------------+--------+ ICA Mid   65      13                                         +----------+--------+--------+--------+------------------+--------+ ICA Distal92      21                                         +----------+--------+--------+--------+------------------+--------+ ECA       122     7               heterogenous               +----------+--------+--------+--------+------------------+--------+ +----------+--------+-------+----------------+-------------------+           PSV cm/sEDV cmsDescribe  Arm Pressure (mmHG) +----------+--------+-------+----------------+-------------------+ [id number]             Multiphasic, WNL100                 +----------+--------+-------+----------------+-------------------+ +---------+--------+--+--------+--+---------+ VertebralPSV cm/s95EDV cm/s11Antegrade +---------+--------+--+--------+--+---------+  Left Carotid Findings: +----------+--------+--------+--------+------------------+--------+           PSV cm/sEDV cm/sStenosisPlaque DescriptionComments +----------+--------+--------+--------+------------------+--------+ CCA Prox  87      11                                         +----------+--------+--------+--------+------------------+--------+ CCA Distal89      13                                         +----------+--------+--------+--------+------------------+--------+ ICA Prox  112     19      1-39%   heterogenous               +----------+--------+--------+--------+------------------+--------+ ICA Mid   93      17                                         +----------+--------+--------+--------+------------------+--------+ ICA Distal64      19                                         +----------+--------+--------+--------+------------------+--------+ ECA       101     6               heterogenous               +----------+--------+--------+--------+------------------+--------+ +----------+--------+--------+----------------+-------------------+           PSV cm/sEDV cm/sDescribe        Arm Pressure (mmHG) +----------+--------+--------+----------------+-------------------+ [id number]             Multiphasic, WNL100                 +----------+--------+--------+----------------+-------------------+ +---------+--------+--+--------+--+---------+ VertebralPSV cm/s98EDV cm/s12Antegrade +---------+--------+--+--------+--+---------+  Summary: Right Carotid: The extracranial vessels were near-normal with only minimal wall                thickening or plaque. Left Carotid: Velocities in the left ICA are  consistent with a 1-39% stenosis. Vertebrals:  Bilateral vertebral arteries demonstrate antegrade flow. Subclavians: Normal flow hemodynamics were seen in bilateral subclavian              arteries. *See table(s) above for measurements and observations.  Electronically signed by Larae Grooms MD on 08/02/2019 at 3:37:30 PM.    Final      Additional studies/ records that were reviewed today include:  I reviewed the patient's emergency room evaluations.  I have also reviewed Dr. Jannifer Franklin is neurologic evaluation and Dr. Vickey Sages notes.  I reviewed her recent laboratory from 07/06/2017.  I reviewed the records of Dr. Glenna Fellows.  I reviewed recent laboratory and her carotid studies.  ASSESSMENT:    1. Essential hypertension, benign   2. H/O Ectopic atrial tachycardia (HCC)   3. Grade II diastolic dysfunction   4. Hyperlipidemia with target LDL less than 70   5. Lumbar spondylosis with  myelopathy     PLAN:  Sandra Henry is a 73 year old female who has a history of hypertension, hyperlipidemia, and  had developed episodes of palpitations and increased heart rate. When I initially saw her she had noticed her episodes at approximately 5 AM in the morning, which raised the possibility of obstructive sleep apnea contributing  to her symptomatology.  Her ECG from her emergency room evaluation  suggested ectopic atrial tachycardia rather than sinus tachycardia.  Symptoms have significantly improved with initiation of Toprol-XL which was titrated up to Toprol to 37.5 mg twice a day.  Presently, she has not had any recurrent episodes of palpitations.  She admits to sleeping well and denies any symptoms suggestive of sleep apnea.  Her echo Doppler study had shown an EF of 55 to 60% with grade 2 diastolic dysfunction.  Recently she has noticed her morning blood pressure to be elevated despite taking losartan 50 mg twice a day and metoprolol succinate 37.5 mg twice a day.  She states her morning blood  pressure may be as high as 1 50-1 70.  For this reason I am adding amlodipine 2.5 mg to take at bedtime which hopefully will provide her additional early morning blood pressure control.  There are no signs of edema and I do not feel seen needs to take her HCTZ on a daily basis.  Remotely, she did develop some very mild myalgias while on rosuvastatin 20 mg.  For the past year she has been on a reduced dose at 15 mg.  With her documentation of mild carotid plaque is evidence for subclinical atherosclerosis I have suggested LDL target of less than 70.  Her most recent lipid panel has shown an LDL of 81.  I have suggested a retrial of increasing her rosuvastatin to 20 mg daily.  If she again develops myalgias and cannot tolerate this my recommendation would be to then add ezetimibe 10 mg to her lower dose rosuvastatin in order to achieve her target LDL goal.  She does not have any anginal symptomatology.  Benefited from physical therapy for her lumbar spondylosis with myelopathy.  I will see her in 6 months for cardiology reevaluation or sooner if problems arise.   Medication Adjustments/Labs and Tests Ordered: Current medicines are reviewed at length with the patient today.  Concerns regarding medicines are outlined above.  Medication changes, Labs and Tests ordered today are listed in the Patient Instructions below.  Patient Instructions  Medication Instructions:  Your physician has recommended you make the following change in your medication:   START AMLODIPINE (NORVASC) 2.5 MG BY MOUTH DAILY  *If you need a refill on your cardiac medications before your next appointment, please call your pharmacy*  Lab Work: NONE If you have labs (blood work) drawn today and your tests are completely normal, you will receive your results only by: Marland Kitchen MyChart Message (if you have MyChart) OR . A paper copy in the mail If you have any lab test that is abnormal or we need to change your treatment, we will call you to  review the results.  Testing/Procedures: NONE  Follow-Up: At [facility], you and your health needs are our priority.  As part of our continuing mission to provide you with exceptional heart care, we have created designated Provider Care Teams.  These Care Teams include your primary Cardiologist (physician) and Advanced Practice Providers (APPs -  Physician Assistants and Nurse Practitioners) who all work together to provide you with the care you need,  when you need it.  Your next appointment:   6 months  The format for your next appointment:   In Person  Provider:   You may see Shelva Majestic, MD or one of the following Advanced Practice Providers on your designated Care Team:    Almyra Deforest, PA-C  Fabian Sharp, PA-C or   Roby Lofts, [place]       Signed, Shelva Majestic, MD  08/24/2019 6:28 PM    [facility] [address], [place], [place], [place]  [address] Phone: [phone]

## 2019-08-24 NOTE — Patient Instructions (Addendum)
Medication Instructions:  Your physician has recommended you make the following change in your medication:   START AMLODIPINE (NORVASC) 2.5 MG BY MOUTH DAILY  *If you need a refill on your cardiac medications before your next appointment, please call your pharmacy*  Lab Work: NONE If you have labs (blood work) drawn today and your tests are completely normal, you will receive your results only by: Marland Kitchen MyChart Message (if you have MyChart) OR . A paper copy in the mail If you have any lab test that is abnormal or we need to change your treatment, we will call you to review the results.  Testing/Procedures: NONE  Follow-Up: At [facility], you and your health needs are our priority.  As part of our continuing mission to provide you with exceptional heart care, we have created designated Provider Care Teams.  These Care Teams include your primary Cardiologist (physician) and Advanced Practice Providers (APPs -  Physician Assistants and Nurse Practitioners) who all work together to provide you with the care you need, when you need it.  Your next appointment:   6 months  The format for your next appointment:   In Person  Provider:   You may see Shelva Majestic, MD or one of the following Advanced Practice Providers on your designated Care Team:    Almyra Deforest, PA-C  Fabian Sharp, PA-C or   Roby Lofts, [place]

## 2020-03-01 ENCOUNTER — Ambulatory Visit: Payer: Medicare Other | Admitting: Cardiovascular Disease

## 2020-04-25 ENCOUNTER — Other Ambulatory Visit: Payer: Self-pay | Admitting: Family Medicine

## 2020-04-25 ENCOUNTER — Other Ambulatory Visit: Payer: Self-pay

## 2020-04-25 ENCOUNTER — Telehealth: Payer: Self-pay | Admitting: Cardiovascular Disease

## 2020-04-25 DIAGNOSIS — I6529 Occlusion and stenosis of unspecified carotid artery: Secondary | ICD-10-CM

## 2020-04-25 DIAGNOSIS — Z1231 Encounter for screening mammogram for malignant neoplasm of breast: Secondary | ICD-10-CM

## 2020-04-25 DIAGNOSIS — I5189 Other ill-defined heart diseases: Secondary | ICD-10-CM

## 2020-04-25 NOTE — Telephone Encounter (Signed)
Order for vas US carotid placed

## 2020-04-25 NOTE — Telephone Encounter (Signed)
Last Carotid 07/2019-no mention to repeat.    Routed to MD to review

## 2020-04-25 NOTE — Telephone Encounter (Signed)
New message    Patient is due to see Dr Tresa Endo in august.  She is calling to schedule a carotid doppler but there is no order.  She states that she has had one before and her PCP suggest she get one.  If ok, please put order in epic so that she can be called to get carotid.  If no, please call patient and let her know.

## 2020-05-16 ENCOUNTER — Ambulatory Visit: Payer: Medicare Other

## 2020-05-16 ENCOUNTER — Ambulatory Visit
Admission: RE | Admit: 2020-05-16 | Discharge: 2020-05-16 | Disposition: A | Discharge status: Home / Self Care | Payer: Medicare Other | Source: Ambulatory Visit | Attending: Family Medicine | Admitting: Family Medicine

## 2020-05-16 ENCOUNTER — Other Ambulatory Visit: Payer: Self-pay

## 2020-05-16 DIAGNOSIS — Z1231 Encounter for screening mammogram for malignant neoplasm of breast: Secondary | ICD-10-CM

## 2020-05-31 ENCOUNTER — Other Ambulatory Visit: Payer: Self-pay

## 2020-05-31 ENCOUNTER — Ambulatory Visit (HOSPITAL_COMMUNITY)
Admission: RE | Admit: 2020-05-31 | Discharge: 2020-05-31 | Disposition: A | Discharge status: Home / Self Care | Payer: Medicare Other | Source: Ambulatory Visit | Attending: Cardiovascular Disease | Admitting: Cardiovascular Disease

## 2020-05-31 DIAGNOSIS — I6529 Occlusion and stenosis of unspecified carotid artery: Secondary | ICD-10-CM | POA: Diagnosis not present

## 2020-05-31 DIAGNOSIS — I5189 Other ill-defined heart diseases: Secondary | ICD-10-CM

## 2020-05-31 DIAGNOSIS — I519 Heart disease, unspecified: Secondary | ICD-10-CM | POA: Diagnosis present

## 2020-06-06 ENCOUNTER — Ambulatory Visit (INDEPENDENT_AMBULATORY_CARE_PROVIDER_SITE_OTHER): Payer: Medicare Other | Admitting: Cardiovascular Disease

## 2020-06-06 ENCOUNTER — Encounter: Payer: Self-pay | Admitting: Cardiovascular Disease

## 2020-06-06 ENCOUNTER — Other Ambulatory Visit: Payer: Self-pay

## 2020-06-06 DIAGNOSIS — I10 Essential (primary) hypertension: Secondary | ICD-10-CM

## 2020-06-06 DIAGNOSIS — E785 Hyperlipidemia, unspecified: Secondary | ICD-10-CM

## 2020-06-06 DIAGNOSIS — I6529 Occlusion and stenosis of unspecified carotid artery: Secondary | ICD-10-CM

## 2020-06-06 DIAGNOSIS — I779 Disorder of arteries and arterioles, unspecified: Secondary | ICD-10-CM

## 2020-06-06 DIAGNOSIS — I519 Heart disease, unspecified: Secondary | ICD-10-CM

## 2020-06-06 DIAGNOSIS — I471 Supraventricular tachycardia: Secondary | ICD-10-CM

## 2020-06-06 DIAGNOSIS — R002 Palpitations: Secondary | ICD-10-CM

## 2020-06-06 DIAGNOSIS — I5189 Other ill-defined heart diseases: Secondary | ICD-10-CM

## 2020-06-06 MED ORDER — AMLODIPINE BESYLATE 5 MG PO TABS
5.0000 mg | ORAL_TABLET | Freq: Every day | ORAL | 3 refills | Status: DC
Start: 2020-06-06 — End: 2023-10-29

## 2020-06-06 NOTE — Patient Instructions (Signed)
Medication Instructions:  INCREASE YOUR AMLODIPINE TO 5MG  DAILY  *If you need a refill on your cardiac medications before your next appointment, please call your pharmacy*    Follow-Up: At [facility], you and your health needs are our priority.  As part of our continuing mission to provide you with exceptional heart care, we have created designated Provider Care Teams.  These Care Teams include your primary Cardiologist (physician) and Advanced Practice Providers (APPs -  Physician Assistants and Nurse Practitioners) who all work together to provide you with the care you need, when you need it.  We recommend signing up for the patient portal called "MyChart".  Sign up information is provided on this After Visit Summary.  MyChart is used to connect with patients for Virtual Visits (Telemedicine).  Patients are able to view lab/test results, encounter notes, upcoming appointments, etc.  Non-urgent messages can be sent to your provider as well.   To learn more about what you can do with MyChart, go to [facility].    Your next appointment:   12 month(s)  The format for your next appointment:   In Person  Provider:   [email or web address], MD

## 2020-06-06 NOTE — Progress Notes (Signed)
Cardiology Office Note    Date:  06/09/2020   ID:  Sandra Henry, DOB May 23, 1946, MRN [id number]  PCP:  Lemmie Evens, MD  Cardiologist:  Shelva Majestic, MD    History of Present Illness:  Sandra Henry is a 74 y.o. female who presents for a 9 month follow-up cardiology evaluation.   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  She has a history of hypertension  She has a history of hyperlipidemia and had been taking Crestor 10 mg 3 times per week.  In October 2017, she developed symptoms of vertigo associated with nausea or vomiting and ultimately was evaluated by Dr. Ivan Croft of ENT, as well as by Dr. Floyde Parkins neurology.  She was diagnosed as having migraines headaches.  She was started on meclizine.  On November 30.  She was awakened from sleep at around 5 AM with palpitations which persisted.  She ultimately presented to the [facility] emergency room where she was found to have tachycardia.  Her ECG reports sinus tachycardia with PACs, but by my review, the EKG shows a at topical low atrial tachycardia, etiology rather than sinus tachycardia.  At that time, she was advised to stop losartan, which had been 25 mg and to initiate Toprol-XL 25 mg daily.  On this therapy, she does note improvement.  However, occasionally she had awakened in the very early morning and has noticed a jittery sensation in her heart rate.  She denied any episodes of chest pain.  She denies any awareness of sleep apnea.  According to her husband, however, she does snore mildly.  She typically gets up one night 1 time per night.  She denied significant nocturia.  She denied excessive daytime sleepiness.    When I initially saw her, I increased her Toprol-XL to 25 mg in the morning and evening.  An echo Doppler study, an EF of 55-60%.  There was grade 2 diastolic dysfunction.  There was mild AR and showed evidence for mitral annular calcification with trivial  MR.  She underwent laboratory.  Renal function was normal.  Left use were normal.  Lipid studies revealed a total cholesterol 172, triglycerides 161, HDL 39, increased VLDL at 32, and LDL at 101.   When I saw her in October 2018  she had continued to feel well with reference to palpitations and had not experienced any since initiating Toprol.  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  She had antegrade flow and normal waveforms in her vertebrals.  She had undergone laboratory by Dr. Karie Kirks on 07/06/2017.  Total cholesterol was 152, triglycerides 110, HDL 46, and LDL 85.  She has continued to take rosuvastatin 10 mg.  She has an on Toprol-XL 37.5 mg in the morning and 25 mg in the evening.  She also is on losartan 50 mg twice a day, and has rarely taken HCTZ for leg swelling.    I  saw her in October 2019 at which time she was remaining stable from a cardiac standpoint.    She had had issues with low back discomfort and has seen Dr. Glenna Fellows on 3 occasions.  She has lumbar spondylosis with myelopathy and has been undergoing physical therapy.  There has been some improvement but if this does not significantly improve further she may ultimately require fusion surgery.  On July 29, 2018 she underwent  a year follow-up carotid Doppler study.  This remained stable and only showed very minimal plaque.  She had normal flow hemodynamics and subclavian as well as vertebral arteries.  She had laboratory on July 25, 2018 by Dr. Karie Kirks.  Total cholesterol was 146, HDL 42.  Triglycerides 122.  LDL cholesterol 82.  In the past she had been on rosuvastatin at 20 mg, reduce this down to 10 mg and recently has been able to tolerate 15 mg daily.  She had developed some mild discomfort in the left calf region and apparently had Doppler study done several weeks back which was negative for DVT.  She denied chest  pain PND palpitations presyncope or syncope.  She was sleeping well.    I last saw her November 2020 and since her prior evaluation she continued to be stable.   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  Recently, she has started to notice her blood pressure in the early morning being elevated normal blood pressure remaining portion of the day.  She has been on a regimen consisting of losartan 50 mg twice a day, metoprolol succinate 37.5 mg twice a day.  She has a prescription for HCTZ but has not taken any since she has not had any edema.  He also has a history of hyperlipidemia and was supposed to be on rosuvastatin 20 mg.  However over the past 6 months she has only been taking 15 mg daily.  Recent laboratory done in [place] had shown an LDL cholesterol at 81.  Cholesterol was 147 triglycerides 127.  She had normal LFTs and renal function.  Hemoglobin hematocrit were stable.  She has been working with her husband who is a Theme park manager in [employer].  During that evaluation I recommended a retrial of increased rosuvastatin of 20 mg.  Over the past year, she has continued to do well.  She was evaluated by Dr. Karie Kirks in July 2021 laboratory was obtained.  Total cholesterol was 161 LDL 94 triglycerides 118.  Fasting glucose was 90.  Renal function was stable with a creatinine of 0.76.  Hemoglobin hematocrit were stable at 14.4 and 45.6, she states her blood pressure typically has been running in the 1 40-1 50 range in the morning and oftentimes and stabilizes at 130 today.  She is taking amlodipine 2.5 mg in addition to her losartan 50 mg twice a day metoprolol succinate 37.5 mg twice a day and HCTZ 12.5 mg as needed.  She has been taking rosuvastatin 20 mg.  She presents for reevaluation.  Past Medical History:  Diagnosis Date  . Dental bridge present    upper  . Dental crowns present   . Dizziness and giddiness   . Glaucoma    . Hypercalcemia   . Hypertension    under control with med., has been on med. x 4 yr.  . Migraine 07/25/2014  . Palpitations   . Seasonal allergies   . 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 BREAST BIOPSY Left   . CATARACT EXTRACTION W/ INTRAOCULAR LENS IMPLANT Bilateral   . TRIGGER FINGER RELEASE Right 02/20/2014   Procedure: RELEASE A-1 PULLEY RIGHT INDEX,  MIDDLE AND RING FINGERS ;  Surgeon: Wynonia Sours, MD;  Location: [facility];  Service: Orthopedics;  Laterality: Right;  .  TUBAL LIGATION     laparoscopic    Current Medications: Outpatient Medications Prior to Visit  Medication Sig Dispense Refill  . Ascorbic Acid (VITAMIN C) 1000 MG tablet Take 1,000 mg by mouth daily.    Marland Kitchen aspirin 81 MG tablet Take 81 mg by mouth daily.    . Calcium-Magnesium-Vitamin D 254-27-062 MG-MG-UNIT TB24 Take 1 tablet by mouth 2 (two) times daily.    . cholecalciferol (VITAMIN D) 400 UNITS TABS tablet Take 400 Units by mouth 2 (two) times daily.    . Garlic 10 MG CAPS Take 1 capsule by mouth daily.    . Glucosamine-Chondroitin (GLUCOSAMINE CHONDR COMPLEX) 500-400 MG CAPS Take 1 tablet by mouth 2 (two) times daily. 1500 - 200    . hydrochlorothiazide (MICROZIDE) 12.5 MG capsule Take 12.5 mg by mouth daily as needed.     Marland Kitchen LORazepam (ATIVAN) 1 MG tablet Take 0.5 mg by mouth 2 (two) times daily.    Marland Kitchen losartan (COZAAR) 50 MG tablet Take 50 mg by mouth 2 (two) times daily.  3  . metoprolol succinate (TOPROL-XL) 25 MG 24 hr tablet Take 37.5 mg by mouth 2 (two) times daily.     . Multiple Vitamin (MULTIVITAMIN) tablet Take 1 tablet by mouth daily.    . Netarsudil-Latanoprost (ROCKLATAN) 0.02-0.005 % SOLN Apply to eye. 1 Drop in each eye at Bedtime    . Omega-3 Fatty Acids (FISH OIL) 1000 MG CAPS Take by mouth daily.    . rosuvastatin (CRESTOR) 20 MG tablet Take 1 tablet (20 mg total)  by mouth daily after breakfast. 90 tablet 3  . timolol (TIMOPTIC) 0.5 % ophthalmic solution timolol maleate 0.5 % eye drops    . amLODipine (NORVASC) 2.5 MG tablet Take 1 tablet (2.5 mg total) by mouth daily. 90 tablet 3  . timolol (BETIMOL) 0.25 % ophthalmic solution Place 1 drop into both eyes daily.      No facility-administered medications prior to visit.     Allergies:   Codeine, Penicillins, and Sulfa antibiotics   Social History   Socioeconomic History  . Marital status: Married    Spouse name: Not on file  . Number of children: Not on file  . Years of education: Not on file  . Highest education level: Not on file  Occupational History  . Occupation: Biomedical scientist: [employer]  Tobacco Use  . Smoking status: Never Smoker  . Smokeless tobacco: Never Used  Substance and Sexual Activity  . Alcohol use: No  . Drug use: No  . Sexual activity: Not on file  Other Topics Concern  . Not on file  Social History Narrative   Lives at home w/ her husband   Left-handed   Caffeine: occasional soft drink   Social Determinants of Health   Financial Resource Strain:   . Difficulty of Paying Living Expenses: Not on file  Food Insecurity:   . Worried About Charity fundraiser in the Last Year: Not on file  . Ran Out of Food in the Last Year: Not on file  Transportation Needs:   . Lack of Transportation (Medical): Not on file  . Lack of Transportation (Non-Medical): Not on file  Physical Activity:   . Days of Exercise per Week: Not on file  . Minutes of Exercise per Session: Not on file  Stress:   . Feeling of Stress : Not on file  Social Connections:   . Frequency of Communication with Friends and Family: Not  on file  . Frequency of Social Gatherings with Friends and Family: Not on file  . Attends Religious Services: Not on file  . Active Member of Clubs or Organizations: Not on file  . Attends Archivist Meetings: Not on file  . Marital Status:  Not on file     Family History:  The patient's family history includes Heart attack in her brother; Migraines in her mother; Stroke in her father, mother, and sister.   ROS General: Negative; No fevers, chills, or night sweats;  HEENT: Negative; No changes in vision or hearing, sinus congestion, difficulty swallowing Pulmonary: Negative; No cough, wheezing, shortness of breath, hemoptysis Cardiovascular: Negative; No chest pain, presyncope, syncope, palpitations GI: Negative; No nausea, vomiting, diarrhea, or abdominal pain GU: Negative; No dysuria, hematuria, or difficulty voiding Musculoskeletal: Lumbar disc disease with myelopathy Hematologic/Oncology: Negative; no easy bruising, bleeding Endocrine: Negative; no heat/cold intolerance; no diabetes Neuro: positive for recent diagnosis of migraine headaches and vertigo Skin: Negative; No rashes or skin lesions Psychiatric: Negative; No behavioral problems, depression Sleep: Negative; No snoring, daytime sleepiness, hypersomnolence, bruxism, restless legs, hypnogognic hallucinations, no cataplexy Other comprehensive 14 point system review is negative.   PHYSICAL EXAM:   VS:  BP 130/64   Pulse 64   Temp (!) 96.9 F (36.1 C)   Ht _0  (1.626 m)   Wt 128 lb (58.1 kg)   SpO2 99%   BMI 21.97 kg/m     Blood pressure by me was 122/68  Wt Readings from Last 3 Encounters:  06/06/20 128 lb (58.1 kg)  08/24/19 132 lb (59.9 kg)  08/03/18 124 lb (56.2 kg)     General: Alert, oriented, no distress.  Skin: normal turgor, no rashes, warm and dry HEENT: Normocephalic, atraumatic. Pupils equal round and reactive to light; sclera anicteric; extraocular muscles intact;  Nose without nasal septal hypertrophy Mouth/Parynx benign; Mallinpatti scale 2 Neck: No JVD, no carotid bruits; normal carotid upstroke Lungs: clear to ausculatation and percussion; no wheezing or rales Chest wall: without tenderness to palpitation Heart: PMI not  displaced, RRR, s1 s2 normal, 1/6 systolic murmur, no diastolic murmur, no rubs, gallops, thrills, or heaves Abdomen: soft, nontender; no hepatosplenomehaly, BS+; abdominal aorta nontender and not dilated by palpation. Back: no CVA tenderness Pulses 2+ Musculoskeletal: full range of motion, normal strength, no joint deformities Extremities: no clubbing cyanosis or edema, Homan's sign negative  Neurologic: grossly nonfocal; Cranial nerves grossly wnl Psychologic: Normal mood and affect   Studies/Labs Reviewed:   ECG (independently read by me): Normal sinus rhythm at 65 bpm.  Nonspecific ST changes.  No ectopy.  Normal intervals.  November 2020 ECG (independently read by me): NSR at 63; normal intervals, no ectopy  October 2019 ECG (independently read by me): Normal sinus rhythm at 72 bpm.  No ectopy.  Normal intervals.  October 2018 ECG (independently read by me): Normal sinus rhythm at 63 bpm.  No ST segment changes.  Normal intervals.  February 2018 ECG (independently read by me): Normal sinus rhythm at 75 bpm.  Normal intervals.  Nonspecific ST changes.  December 2017 ECG (independently read by me): Normal sinus rhythm at 77 bpm.  PR interval 174 ms, QTc interval 432 ms.  I personally reviewed the ECG from 09/10/2016, and in contrast to the report which reads sinus tachycardia with PACs, the patient is in a low atrial ectopic tachycardic rhythm and then developed sinus rhythm after a pause followed by recurrent low atrial rhythm.  There are PACs.  Recent Labs: BMP Latest Ref Rng & Units 10/13/2016 09/10/2016 07/12/2016  Glucose 65 - 99 mg/dL 80 93 118(H)  BUN 7 - 25 mg/dL 18 28(H) 22(H)  Creatinine 0.60 - 0.93 mg/dL 0.78 0.95 0.93  Sodium 135 - 146 mmol/L 140 141 140  Potassium 3.5 - 5.3 mmol/L 4.1 4.0 3.7  Chloride 98 - 110 mmol/L 107 109 106  CO2 20 - 31 mmol/L _0 Calcium 8.6 - 10.4 mg/dL 9.6 10.0 10.1     Hepatic Function Latest Ref Rng & Units 10/13/2016 09/10/2016  07/12/2016  Total Protein 6.1 - 8.1 g/dL 7.3 6.9 7.7  Albumin 3.6 - 5.1 g/dL 4.1 3.6 3.4(L)  AST 10 - 35 U/L _1 ALT 6 - 29 U/L 17 20 12(L)  Alk Phosphatase 33 - 130 U/L 46 54 64  Total Bilirubin 0.2 - 1.2 mg/dL 0.4 0.2(L) 0.1(L)    CBC Latest Ref Rng & Units 09/10/2016 09/07/2016 07/12/2016  WBC 4.0 - 10.5 K/uL 7.3 6.0 7.6  Hemoglobin 12.0 - 15.0 g/dL 13.5 13.2 12.7  Hematocrit 36 - 46 % 41.0 39.2 38.9  Platelets 150 - 400 K/uL 330 332 584(H)   Lab Results  Component Value Date   MCV 93.4 09/10/2016   MCV 94.0 09/07/2016   MCV 95.3 07/12/2016   Lab Results  Component Value Date   TSH 3.661 09/10/2016   No results found for: HGBA1C   BNP No results found for: BNP  ProBNP No results found for: PROBNP   Lipid Panel     Component Value Date/Time   CHOL 172 10/13/2016 0852   TRIG 161 (H) 10/13/2016 0852   HDL 39 (L) 10/13/2016 0852   CHOLHDL 4.4 10/13/2016 0852   VLDL 32 (H) 10/13/2016 0852   LDLCALC 101 (H) 10/13/2016 6811     Lipid study obtained in Dr. Vickey Sages office on 03/26/2016:  Total cholesterol 186, HDL 48, triglycerides 107, LDL 117.  RADIOLOGY: MM 3D SCREEN BREAST BILATERAL  Result Date: 05/17/2020 CLINICAL DATA:  Screening. EXAM: DIGITAL SCREENING BILATERAL MAMMOGRAM WITH TOMO AND CAD COMPARISON:  Previous exam(s). ACR Breast Density Category c: The breast tissue is heterogeneously dense, which may obscure small masses. FINDINGS: There are no findings suspicious for malignancy. Images were processed with CAD. IMPRESSION: No mammographic evidence of malignancy. A result letter of this screening mammogram will be mailed directly to the patient. RECOMMENDATION: Screening mammogram in one year. (Code:SM-B-01Y) BI-RADS CATEGORY  1: Negative. Electronically Signed   By: Kristopher Oppenheim M.D.   On: 05/17/2020 16:14   VAS US CAROTID  Result Date: 05/31/2020 Carotid Arterial Duplex Study Indications:       Bilateral carotid artery stenosis. Patient c/o  dizziness when                    getting up too quickly and occasional headaches. She denies                    any other cerebrovascular symptoms. Risk Factors:      Hypertension, no history of smoking. Comparison Study:  In 07/2019, a carotid duplex showed velocities of 90/13 cm/s                    in the RICA and 572/62 cm/s in the LICA. Performing Technologist: Sharlett Iles RVT  Examination Guidelines: A complete evaluation includes B-mode imaging, spectral Doppler, color Doppler, and power Doppler as needed of all accessible portions of each  vessel. Bilateral testing is considered an integral part of a complete examination. Limited examinations for reoccurring indications may be performed as noted.  Right Carotid Findings: +----------+--------+--------+--------+------------------+--------+           PSV cm/sEDV cm/sStenosisPlaque DescriptionComments +----------+--------+--------+--------+------------------+--------+ CCA Prox  83      9                                          +----------+--------+--------+--------+------------------+--------+ CCA Distal59      11                                         +----------+--------+--------+--------+------------------+--------+ ICA Prox  56      9       Normal                             +----------+--------+--------+--------+------------------+--------+ ICA Mid   87      21                                tortuous +----------+--------+--------+--------+------------------+--------+ ICA Distal89      22                                tortuous +----------+--------+--------+--------+------------------+--------+ ECA       145     6       >50%    heterogenous               +----------+--------+--------+--------+------------------+--------+ +----------+--------+-------+----------------+-------------------+           PSV cm/sEDV cmsDescribe        Arm Pressure (mmHG)  +----------+--------+-------+----------------+-------------------+ [id number]            Multiphasic, [id number]                 +----------+--------+-------+----------------+-------------------+ +---------+--------+--+--------+--+---------+ VertebralPSV cm/s74EDV cm/s10Antegrade +---------+--------+--+--------+--+---------+  Left Carotid Findings: +----------+--------+--------+--------+------------------+--------+           PSV cm/sEDV cm/sStenosisPlaque DescriptionComments +----------+--------+--------+--------+------------------+--------+ CCA Prox  76      11                                         +----------+--------+--------+--------+------------------+--------+ CCA Distal69      10                                         +----------+--------+--------+--------+------------------+--------+ ICA Prox  112     23      1-39%   heterogenous               +----------+--------+--------+--------+------------------+--------+ ICA Mid   94      23                                         +----------+--------+--------+--------+------------------+--------+ ICA Distal61      17                                         +----------+--------+--------+--------+------------------+--------+  ECA       60      7               heterogenous               +----------+--------+--------+--------+------------------+--------+ +----------+--------+--------+----------------+-------------------+           PSV cm/sEDV cm/sDescribe        Arm Pressure (mmHG) +----------+--------+--------+----------------+-------------------+ Subclavian171             Multiphasic, [id number]                 +----------+--------+--------+----------------+-------------------+ +---------+--------+--+--------+--+---------+ VertebralPSV cm/s54EDV cm/s10Antegrade +---------+--------+--+--------+--+---------+   Summary: Right Carotid: There is no evidence of stenosis in the right ICA. There was  no                evidence of thrombus, dissection, atherosclerotic plaque or                stenosis in the cervical carotid system. Left Carotid: Velocities in the left ICA are consistent with a 1-39% stenosis. Vertebrals:  Bilateral vertebral arteries demonstrate antegrade flow. Subclavians: Normal flow hemodynamics were seen in bilateral subclavian              arteries. *See table(s) above for measurements and observations.  Electronically signed by Quay Burow MD on 05/31/2020 at 12:36:48 PM.    Final      Additional studies/ records that were reviewed today include:  I reviewed the patient's emergency room evaluations.  I have also reviewed Dr. Jannifer Franklin is neurologic evaluation and Dr. Vickey Sages notes.  I reviewed her recent laboratory from 07/06/2017.  I reviewed the records of Dr. Glenna Fellows.  I reviewed recent laboratory and her carotid studies.  ASSESSMENT:    1. Essential hypertension   2. Grade II diastolic dysfunction   3. Heart palpitations   4. Ectopic atrial tachycardia (HCC)   5. Hyperlipidemia with target LDL less than 70   6. Mild carotid artery disease [facility])     PLAN:  Sandra Henry is a 74 year old female who has a history of hypertension, hyperlipidemia, and  had developed episodes of palpitations and increased heart rate. When I initially saw her she had noticed her episodes at approximately 5 AM in the morning, which raised the possibility of obstructive sleep apnea contributing  to her symptomatology.  Her ECG from her emergency room evaluation  suggested ectopic atrial tachycardia rather than sinus tachycardia.  Symptoms have significantly improved with initiation of Toprol-XL which was titrated up to Toprol to 37.5 mg twice a day.  On prior echo she was found to have normal systolic function with grade 2 diastolic dysfunction.  On her current dose of Toprol-XL, she denies any recurrent palpitations.  She has noticed increased blood pressure in the early morning  with systolics ranging from 034 up to 150 mmHg.  She has only been taking amlodipine 2.5 mg.  I suggested further titration to 5 mg daily.  She will continue taking losartan 50 mg twice a day and HCTZ 12.5 mg as needed.  There is no edema today.  I reviewed the recent evaluation with Dr. Karie Kirks as well as laboratory.  She is tolerating rosuvastatin 20 mg.  Remotely she had a history of myalgias.  If lipid studies further increase I would recommend initiation of Zetia 10 mg but will not start this presently.  She is sleeping well.  Her sleep is restorative.  There is no daytime sleepiness.  She will continue current  therapy.  I will see her in 1 year for reevaluation or sooner as needed.   Medication Adjustments/Labs and Tests Ordered: Current medicines are reviewed at length with the patient today.  Concerns regarding medicines are outlined above.  Medication changes, Labs and Tests ordered today are listed in the Patient Instructions below.  Patient Instructions  Medication Instructions:  INCREASE YOUR AMLODIPINE TO 5MG DAILY  *If you need a refill on your cardiac medications before your next appointment, please call your pharmacy*    Follow-Up: At [facility], you and your health needs are our priority.  As part of our continuing mission to provide you with exceptional heart care, we have created designated Provider Care Teams.  These Care Teams include your primary Cardiologist (physician) and Advanced Practice Providers (APPs -  Physician Assistants and Nurse Practitioners) who all work together to provide you with the care you need, when you need it.  We recommend signing up for the patient portal called "MyChart".  Sign up information is provided on this After Visit Summary.  MyChart is used to connect with patients for Virtual Visits (Telemedicine).  Patients are able to view lab/test results, encounter notes, upcoming appointments, etc.  Non-urgent messages can be sent to your provider  as well.   To learn more about what you can do with MyChart, go to [email or web address].    Your next appointment:   12 month(s)  The format for your next appointment:   In Person  Provider:   Shelva Majestic, MD       Signed, Shelva Majestic, MD  06/09/2020 2:23 PM    [facility] [address] [place], [place], [place]  [address] Phone: [phone]

## 2020-06-09 ENCOUNTER — Encounter: Payer: Self-pay | Admitting: Cardiovascular Disease

## 2020-10-08 ENCOUNTER — Encounter (INDEPENDENT_AMBULATORY_CARE_PROVIDER_SITE_OTHER): Payer: Self-pay | Admitting: *Deleted

## 2020-12-03 ENCOUNTER — Encounter (INDEPENDENT_AMBULATORY_CARE_PROVIDER_SITE_OTHER): Payer: Self-pay | Admitting: *Deleted

## 2021-01-01 ENCOUNTER — Encounter (INDEPENDENT_AMBULATORY_CARE_PROVIDER_SITE_OTHER): Payer: Self-pay | Admitting: *Deleted

## 2021-01-01 ENCOUNTER — Telehealth (INDEPENDENT_AMBULATORY_CARE_PROVIDER_SITE_OTHER): Payer: Self-pay | Admitting: *Deleted

## 2021-01-01 ENCOUNTER — Other Ambulatory Visit (INDEPENDENT_AMBULATORY_CARE_PROVIDER_SITE_OTHER): Payer: Self-pay | Admitting: *Deleted

## 2021-01-01 DIAGNOSIS — Z1211 Encounter for screening for malignant neoplasm of colon: Secondary | ICD-10-CM

## 2021-01-01 MED ORDER — SUPREP BOWEL PREP KIT 17.5-3.13-1.6 GM/177ML PO SOLN: 1.0000 | Freq: Once | ORAL | 0 refills | Status: AC

## 2021-01-01 NOTE — Telephone Encounter (Signed)
Referring MD/PCP: knowlton   Procedure: tcs  Reason/Indication:  screening  Has patient had this procedure before?  Yes, 2012  If so, when, by whom and where?    Is there a family history of colon cancer?  no  Who?  What age when diagnosed?    Is patient diabetic?   no      Does patient have prosthetic heart valve or mechanical valve?  no  Do you have a pacemaker/defibrillator?  no  Has patient ever had endocarditis/atrial fibrillation? no  Have you had a stroke/heart attack last 6 mths? no  Does patient use oxygen? no  Has patient had joint replacement within last 12 months?  no  Is patient constipated or do they take laxatives? no  Does patient have a history of alcohol/drug use?  no  Is patient on blood thinner such as Coumadin, Plavix and/or Aspirin? yes  Do you take medicine for weight loss. no  Medications: asa 81 mg daily, losartan 50 mg bid, amlodipine 5 mg daily, metoprolol 25 mg 1 1/2 tab daily, rosuvastatin 10 mg bid, timolol eye drop daily, calcium 600 mg bid, vit d 400 units bid, fish oil 1000 mg bid, garlic 10 mg daily, glucosamine 1500 mg daily, mvi daily, vit e daily  Allergies: codeine, pcn, sulfur  Medication Adjustment per Dr Rehman/Dr Levon Hedger asa 2 days, vit e 10 days  Procedure date & time: 01/30/21

## 2021-01-01 NOTE — Telephone Encounter (Signed)
Patient needs suprep 

## 2021-01-06 ENCOUNTER — Encounter (INDEPENDENT_AMBULATORY_CARE_PROVIDER_SITE_OTHER): Payer: Self-pay | Admitting: *Deleted

## 2021-01-06 ENCOUNTER — Other Ambulatory Visit (INDEPENDENT_AMBULATORY_CARE_PROVIDER_SITE_OTHER): Payer: Self-pay

## 2021-01-06 ENCOUNTER — Telehealth (INDEPENDENT_AMBULATORY_CARE_PROVIDER_SITE_OTHER): Payer: Self-pay

## 2021-01-06 DIAGNOSIS — Z1211 Encounter for screening for malignant neoplasm of colon: Secondary | ICD-10-CM

## 2021-01-07 NOTE — Telephone Encounter (Signed)
LeighAnn Nekeisha Aure, CMA  

## 2021-01-28 ENCOUNTER — Other Ambulatory Visit (HOSPITAL_COMMUNITY)
Admission: RE | Admit: 2021-01-28 | Discharge: 2021-01-28 | Disposition: A | Discharge status: Home / Self Care | Payer: Medicare Other | Source: Ambulatory Visit | Attending: Internal Medicine | Admitting: Internal Medicine

## 2021-01-28 ENCOUNTER — Other Ambulatory Visit: Payer: Self-pay

## 2021-01-28 DIAGNOSIS — Z20822 Contact with and (suspected) exposure to covid-19: Secondary | ICD-10-CM | POA: Diagnosis not present

## 2021-01-28 DIAGNOSIS — Z01812 Encounter for preprocedural laboratory examination: Secondary | ICD-10-CM | POA: Diagnosis present

## 2021-01-28 LAB — SARS CORONAVIRUS 2 (TAT 6-24 HRS): SARS Coronavirus 2: NEGATIVE

## 2021-01-30 ENCOUNTER — Other Ambulatory Visit: Payer: Self-pay

## 2021-01-30 ENCOUNTER — Encounter (INDEPENDENT_AMBULATORY_CARE_PROVIDER_SITE_OTHER): Payer: Self-pay | Admitting: *Deleted

## 2021-01-30 ENCOUNTER — Encounter (HOSPITAL_COMMUNITY)
Admission: RE | Disposition: A | Discharge status: Home / Self Care | Payer: Self-pay | Source: Home / Self Care | Attending: Internal Medicine

## 2021-01-30 ENCOUNTER — Encounter (HOSPITAL_COMMUNITY): Payer: Self-pay | Admitting: Internal Medicine

## 2021-01-30 ENCOUNTER — Ambulatory Visit (HOSPITAL_COMMUNITY)
Admission: RE | Admit: 2021-01-30 | Discharge: 2021-01-30 | Disposition: A | Discharge status: Home / Self Care | Payer: Medicare Other | Attending: Internal Medicine | Admitting: Internal Medicine

## 2021-01-30 DIAGNOSIS — Z79899 Other long term (current) drug therapy: Secondary | ICD-10-CM | POA: Insufficient documentation

## 2021-01-30 DIAGNOSIS — Z8249 Family history of ischemic heart disease and other diseases of the circulatory system: Secondary | ICD-10-CM | POA: Insufficient documentation

## 2021-01-30 DIAGNOSIS — Z7982 Long term (current) use of aspirin: Secondary | ICD-10-CM | POA: Insufficient documentation

## 2021-01-30 DIAGNOSIS — Z880 Allergy status to penicillin: Secondary | ICD-10-CM | POA: Insufficient documentation

## 2021-01-30 DIAGNOSIS — Z882 Allergy status to sulfonamides status: Secondary | ICD-10-CM | POA: Insufficient documentation

## 2021-01-30 DIAGNOSIS — K644 Residual hemorrhoidal skin tags: Secondary | ICD-10-CM | POA: Diagnosis not present

## 2021-01-30 DIAGNOSIS — H409 Unspecified glaucoma: Secondary | ICD-10-CM | POA: Diagnosis not present

## 2021-01-30 DIAGNOSIS — Z1211 Encounter for screening for malignant neoplasm of colon: Secondary | ICD-10-CM | POA: Diagnosis present

## 2021-01-30 DIAGNOSIS — Z885 Allergy status to narcotic agent status: Secondary | ICD-10-CM | POA: Diagnosis not present

## 2021-01-30 DIAGNOSIS — I10 Essential (primary) hypertension: Secondary | ICD-10-CM | POA: Diagnosis not present

## 2021-01-30 DIAGNOSIS — K5730 Diverticulosis of large intestine without perforation or abscess without bleeding: Secondary | ICD-10-CM | POA: Insufficient documentation

## 2021-01-30 HISTORY — PX: COLONOSCOPY: SHX5424

## 2021-01-30 LAB — HM COLONOSCOPY

## 2021-01-30 SURGERY — COLONOSCOPY
Anesthesia: Moderate Sedation

## 2021-01-30 MED ORDER — STERILE WATER FOR IRRIGATION IR SOLN
Status: DC | PRN
  Administered 2021-01-30: 200 mL

## 2021-01-30 MED ORDER — MIDAZOLAM HCL 5 MG/5ML IJ SOLN
INTRAMUSCULAR | Status: DC | PRN
  Administered 2021-01-30 (×2): 1 mg via INTRAVENOUS
  Administered 2021-01-30: 2 mg via INTRAVENOUS
  Administered 2021-01-30: 1 mg via INTRAVENOUS

## 2021-01-30 MED ORDER — MIDAZOLAM HCL 5 MG/5ML IJ SOLN
INTRAMUSCULAR | Status: AC
  Filled 2021-01-30: qty 10

## 2021-01-30 MED ORDER — MEPERIDINE HCL 50 MG/ML IJ SOLN
INTRAMUSCULAR | Status: AC
  Filled 2021-01-30: qty 1

## 2021-01-30 MED ORDER — SODIUM CHLORIDE 0.9 % IV SOLN: INTRAVENOUS | Status: DC

## 2021-01-30 MED ORDER — MEPERIDINE HCL 50 MG/ML IJ SOLN
INTRAMUSCULAR | Status: DC | PRN
  Administered 2021-01-30: 25 mg
  Administered 2021-01-30: 20 mg via INTRAVENOUS

## 2021-01-30 NOTE — H&P (Signed)
Sandra Henry is an 75 y.o. female.   Chief Complaint: Patient is here for colonoscopy. HPI: Patient is 75 year old Caucasian female who is here for screening colonoscopy.  Last exam was in January 2012.  She denies abdominal pain change in bowel habits or frank rectal bleeding.  She has hemorrhoids and occasionally may see blood on the tissue if her stool is hard.  Her appetite is good and her weight has been stable.  Last aspirin dose was 3 days ago. Family history is negative for CRC.  Past Medical History:  Diagnosis Date  .       .    .    . Glaucoma   . Hypercalcemia   . Hypertension    under control with med., has been on med. x 4 yr.  . Migraine 07/25/2014  . Palpitations   . Seasonal allergies   . 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 BREAST BIOPSY Left   . CATARACT EXTRACTION W/ INTRAOCULAR LENS IMPLANT Bilateral   . TRIGGER FINGER RELEASE Right 02/20/2014   Procedure: RELEASE A-1 PULLEY RIGHT INDEX,  MIDDLE AND RING FINGERS ;  Surgeon: Nicki Reaper, MD;  Location: [facility];  Service: Orthopedics;  Laterality: Right;  . TUBAL LIGATION     laparoscopic    Family History  Problem Relation Age of Onset  . Stroke Mother   . Migraines Mother   . Stroke Father   . Heart attack Brother   . Stroke Sister    Social History:  reports that she has never smoked. She has never used smokeless tobacco. She reports that she does not drink alcohol and does not use drugs.  Allergies:  Allergies  Allergen Reactions  . Codeine Nausea Only  . Penicillins Rash    AS A CHILD  . Sulfa Antibiotics Rash    Medications Prior to Admission  Medication Sig Dispense Refill  . amLODipine (NORVASC) 5 MG tablet Take 1 tablet (5 mg total) by mouth daily. (Patient taking differently: Take 5 mg by mouth at bedtime.) 90 tablet 3  . Ascorbic Acid (VITAMIN C) 1000 MG  tablet Take 1,000 mg by mouth daily.    Marland Kitchen aspirin 81 MG tablet Take 81 mg by mouth daily.    . Calcium-Magnesium-Vitamin D 600-40-500 MG-MG-UNIT TB24 Take 1 tablet by mouth 2 (two) times daily.    . cholecalciferol (VITAMIN D) 400 UNITS TABS tablet Take 400 Units by mouth 2 (two) times daily.    . Garlic 10 MG CAPS Take 10 mg by mouth daily.    . Glucosamine-Chondroitin 500-400 MG CAPS Take 2 tablets by mouth daily.    Marland Kitchen LORazepam (ATIVAN) 1 MG tablet Take 0.5 mg by mouth 2 (two) times daily.    Marland Kitchen losartan (COZAAR) 50 MG tablet Take 50 mg by mouth 2 (two) times daily.  3  . metoprolol succinate (TOPROL-XL) 25 MG 24 hr tablet Take 37.5 mg by mouth 2 (two) times daily.     . Multiple Vitamin (MULTIVITAMIN) tablet Take 1 tablet by mouth daily.    . Netarsudil-Latanoprost (ROCKLATAN) 0.02-0.005 % SOLN Place 1 drop into both eyes at bedtime.    . Omega-3 Fatty Acids (FISH OIL) 1000 MG CAPS Take 1,000 mg by mouth daily.    . rosuvastatin (CRESTOR) 20 MG tablet Take 1 tablet (20 mg total) by mouth daily after breakfast. (  Patient taking differently: Take 15 mg by mouth at bedtime.) 90 tablet 3  . timolol (TIMOPTIC) 0.5 % ophthalmic solution Place 1 drop into both eyes every morning.      No results found for this or any previous visit (from the past 48 hour(s)). No results found.  Review of Systems  Blood pressure (!) 151/60, pulse 61, temperature 98.2 F (36.8 C), temperature source Oral, resp. rate 13, height 5\' 4"  (1.626 m), weight 56.2 kg, SpO2 99 %. Physical Exam HENT:     Mouth/Throat:     Mouth: Mucous membranes are moist.     Pharynx: Oropharynx is clear.  Eyes:     General: No scleral icterus.    Conjunctiva/sclera: Conjunctivae normal.  Cardiovascular:     Rate and Rhythm: Normal rate and regular rhythm.     Heart sounds: Normal heart sounds. No murmur heard.   Pulmonary:     Effort: Pulmonary effort is normal.     Breath sounds: Normal breath sounds.  Abdominal:      General: There is no distension.     Palpations: Abdomen is soft. There is no mass.     Tenderness: There is no abdominal tenderness.  Musculoskeletal:        General: No swelling.     Cervical back: Neck supple.  Lymphadenopathy:     Cervical: No cervical adenopathy.  Skin:    General: Skin is warm and dry.  Neurological:     Mental Status: She is alert.      Assessment/Plan  Average risk screening colonoscopy  , MD 01/30/2021, 9:21 AM

## 2021-01-30 NOTE — Discharge Instructions (Signed)
Resume usual medications including aspirin as before. High-fiber diet. No driving for 24 hours. No more screening colonoscopies.   Hemorrhoids Hemorrhoids are swollen veins that may develop:  In the butt (rectum). These are called internal hemorrhoids.  Around the opening of the butt (anus). These are called external hemorrhoids. Hemorrhoids can cause pain, itching, or bleeding. Most of the time, they do not cause serious problems. They usually get better with diet changes, lifestyle changes, and other home treatments. What are the causes? This condition may be caused by:  Having trouble pooping (constipation).  Pushing hard (straining) to poop.  Watery poop (diarrhea).  Pregnancy.  Being very overweight (obese).  Sitting for long periods of time.  Heavy lifting or other activity that causes you to strain.  Anal sex.  Riding a bike for a long period of time. What are the signs or symptoms? Symptoms of this condition include:  Pain.  Itching or soreness in the butt.  Bleeding from the butt.  Leaking poop.  Swelling in the area.  One or more lumps around the opening of your butt. How is this diagnosed? A doctor can often diagnose this condition by looking at the affected area. The doctor may also:  Do an exam that involves feeling the area with a gloved hand (digital rectal exam).  Examine the area inside your butt using a small tube (anoscope).  Order blood tests. This may be done if you have lost a lot of blood.  Have you get a test that involves looking inside the colon using a flexible tube with a camera on the end (sigmoidoscopy or colonoscopy). How is this treated? This condition can usually be treated at home. Your doctor may tell you to change what you eat, make lifestyle changes, or try home treatments. If these do not help, procedures can be done to remove the hemorrhoids or make them smaller. These may involve:  Placing rubber bands at the base of  the hemorrhoids to cut off their blood supply.  Injecting medicine into the hemorrhoids to shrink them.  Shining a type of light energy onto the hemorrhoids to cause them to fall off.  Doing surgery to remove the hemorrhoids or cut off their blood supply. Follow these instructions at home: Eating and drinking  Eat foods that have a lot of fiber in them. These include whole grains, beans, nuts, fruits, and vegetables.  Ask your doctor about taking products that have added fiber (fibersupplements).  Reduce the amount of fat in your diet. You can do this by: ? Eating low-fat dairy products. ? Eating less red meat. ? Avoiding processed foods.  Drink enough fluid to keep your pee (urine) pale yellow.   Managing pain and swelling  Take a warm-water bath (sitz bath) for 20 minutes to ease pain. Do this 3-4 times a day. You may do this in a bathtub or using a portable sitz bath that fits over the toilet.  If told, put ice on the painful area. It may be helpful to use ice between your warm baths. ? Put ice in a plastic bag. ? Place a towel between your skin and the bag. ? Leave the ice on for 20 minutes, 2-3 times a day.   General instructions  Take over-the-counter and prescription medicines only as told by your doctor. ? Medicated creams and medicines may be used as told.  Exercise often. Ask your doctor how much and what kind of exercise is best for you.  Go to the  bathroom when you have the urge to poop. Do not wait.  Avoid pushing too hard when you poop.  Keep your butt dry and clean. Use wet toilet paper or moist towelettes after pooping.  Do not sit on the toilet for a long time.  Keep all follow-up visits as told by your doctor. This is important. Contact a doctor if you:  Have pain and swelling that do not get better with treatment or medicine.  Have trouble pooping.  Cannot poop.  Have pain or swelling outside the area of the hemorrhoids. Get help right away if  you have:  Bleeding that will not stop. Summary  Hemorrhoids are swollen veins in the butt or around the opening of the butt.  They can cause pain, itching, or bleeding.  Eat foods that have a lot of fiber in them. These include whole grains, beans, nuts, fruits, and vegetables.  Take a warm-water bath (sitz bath) for 20 minutes to ease pain. Do this 3-4 times a day. This information is not intended to replace advice given to you by your health care provider. Make sure you discuss any questions you have with your health care provider. Document Revised: 10/06/2018 Document Reviewed: 02/17/2018 Elsevier Patient Education  2021 Elsevier Inc.  Diverticulosis  Diverticulosis is a condition that develops when small pouches (diverticula) form in the wall of the large intestine (colon). The colon is where water is absorbed and stool (feces) is formed. The pouches form when the inside layer of the colon pushes through weak spots in the outer layers of the colon. You may have a few pouches or many of them. The pouches usually do not cause problems unless they become inflamed or infected. When this happens, the condition is called diverticulitis. What are the causes? The cause of this condition is not known. What increases the risk? The following factors may make you more likely to develop this condition:  Being older than age 71. Your risk for this condition increases with age. Diverticulosis is rare among people younger than age 72. By age 31, many people have it.  Eating a low-fiber diet.  Having frequent constipation.  Being overweight.  Not getting enough exercise.  Smoking.  Taking over-the-counter pain medicines, like aspirin and ibuprofen.  Having a family history of diverticulosis. What are the signs or symptoms? In most people, there are no symptoms of this condition. If you do have symptoms, they may include:  Bloating.  Cramps in the abdomen.  Constipation or  diarrhea.  Pain in the lower left side of the abdomen. How is this diagnosed? Because diverticulosis usually has no symptoms, it is most often diagnosed during an exam for other colon problems. The condition may be diagnosed by:  Using a flexible scope to examine the colon (colonoscopy).  Taking an X-ray of the colon after dye has been put into the colon (barium enema).  Having a CT scan. How is this treated? You may not need treatment for this condition. Your health care provider may recommend treatment to prevent problems. You may need treatment if you have symptoms or if you previously had diverticulitis. Treatment may include:  Eating a high-fiber diet.  Taking a fiber supplement.  Taking a live bacteria supplement (probiotic).  Taking medicine to relax your colon.   Follow these instructions at home: Medicines  Take over-the-counter and prescription medicines only as told by your health care provider.  If told by your health care provider, take a fiber supplement or probiotic. Constipation  prevention Your condition may cause constipation. To prevent or treat constipation, you may need to:  Drink enough fluid to keep your urine pale yellow.  Take over-the-counter or prescription medicines.  Eat foods that are high in fiber, such as beans, whole grains, and fresh fruits and vegetables.  Limit foods that are high in fat and processed sugars, such as fried or sweet foods.   General instructions  Try not to strain when you have a bowel movement.  Keep all follow-up visits as told by your health care provider. This is important. Contact a health care provider if you:  Have pain in your abdomen.  Have bloating.  Have cramps.  Have not had a bowel movement in 3 days. Get help right away if:  Your pain gets worse.  Your bloating becomes very bad.  You have a fever or chills, and your symptoms suddenly get worse.  You vomit.  You have bowel movements that are  bloody or black.  You have bleeding from your rectum. Summary  Diverticulosis is a condition that develops when small pouches (diverticula) form in the wall of the large intestine (colon).  You may have a few pouches or many of them.  This condition is most often diagnosed during an exam for other colon problems.  Treatment may include increasing the fiber in your diet, taking supplements, or taking medicines. This information is not intended to replace advice given to you by your health care provider. Make sure you discuss any questions you have with your health care provider. Document Revised: 04/27/2019 Document Reviewed: 04/27/2019 Elsevier Patient Education  2021 Elsevier Inc.  Colonoscopy, Adult, Care After This sheet gives you information about how to care for yourself after your procedure. Your doctor may also give you more specific instructions. If you have problems or questions, call your doctor. What can I expect after the procedure? After the procedure, it is common to have:  A small amount of blood in your poop (stool) for 24 hours.  Some gas.  Mild cramping or bloating in your belly (abdomen). Follow these instructions at home: Eating and drinking  Drink enough fluid to keep your pee (urine) pale yellow.  Follow instructions from your doctor about what you cannot eat or drink.  Return to your normal diet as told by your doctor. Avoid heavy or fried foods that are hard to digest.   Activity  Rest as told by your doctor.  Do not sit for a long time without moving. Get up to take short walks every 1-2 hours. This is important. Ask for help if you feel weak or unsteady.  Return to your normal activities as told by your doctor. Ask your doctor what activities are safe for you. To help cramping and bloating:  Try walking around.  Put heat on your belly as told by your doctor. Use the heat source that your doctor recommends, such as a moist heat pack or a heating  pad. ? Put a towel between your skin and the heat source. ? Leave the heat on for 20-30 minutes. ? Remove the heat if your skin turns bright red. This is very important if you are unable to feel pain, heat, or cold. You may have a greater risk of getting burned.   General instructions  If you were given a medicine to help you relax (sedative) during your procedure, it can affect you for many hours. Do not drive or use machinery until your doctor says that it is safe.  For the first 24 hours after the procedure: ? Do not sign important documents. ? Do not drink alcohol. ? Do your daily activities more slowly than normal. ? Eat foods that are soft and easy to digest.  Take over-the-counter or prescription medicines only as told by your doctor.  Keep all follow-up visits as told by your doctor. This is important. Contact a doctor if:  You have blood in your poop 2-3 days after the procedure. Get help right away if:  You have more than a small amount of blood in your poop.  You see large clumps of tissue (blood clots) in your poop.  Your belly is swollen.  You feel like you may vomit (nauseous).  You vomit.  You have a fever.  You have belly pain that gets worse, and medicine does not help your pain. Summary  After the procedure, it is common to have a small amount of blood in your poop. You may also have mild cramping and bloating in your belly.  If you were given a medicine to help you relax (sedative) during your procedure, it can affect you for many hours. Do not drive or use machinery until your doctor says that it is safe.  Get help right away if you have a lot of blood in your poop, feel like you may vomit, have a fever, or have more belly pain. This information is not intended to replace advice given to you by your health care provider. Make sure you discuss any questions you have with your health care provider. Document Revised: 08/04/2019 Document Reviewed:  04/24/2019 Elsevier Patient Education  [place].

## 2021-01-30 NOTE — Op Note (Signed)
[facility] Patient Name: Sandra Henry Procedure Date: 01/30/2021 9:14 AM MRN: [id number] Date of Birth: [birth year] Attending MD: Lionel December , MD CSN: [id number] Age: 75 Admit Type: Outpatient Procedure:                Colonoscopy Indications:              Screening for colorectal malignant neoplasm Providers:                Lionel December, MD, Crystal Page, Edythe Clarity,                            Technician Referring MD:             Gareth Morgan, MD Medicines:                Meperidine 50 mg IV, Midazolam 5 mg IV Complications:            No immediate complications. Estimated Blood Loss:     Estimated blood loss: none. Procedure:                Pre-Anesthesia Assessment:                           - Prior to the procedure, a History and Physical                            was performed, and patient medications and                            allergies were reviewed. The patient's tolerance of                            previous anesthesia was also reviewed. The risks                            and benefits of the procedure and the sedation                            options and risks were discussed with the patient.                            All questions were answered, and informed consent                            was obtained. Prior Anticoagulants: The patient has                            taken no previous anticoagulant or antiplatelet                            agents except for aspirin. ASA Grade Assessment: II                            - A patient with mild systemic disease. After  reviewing the risks and benefits, the patient was                            deemed in satisfactory condition to undergo the                            procedure.                           After obtaining informed consent, the colonoscope                            was passed under direct vision. Throughout the                            procedure, the  patient's blood pressure, pulse, and                            oxygen saturations were monitored continuously. The                            [id number]-[id number] ([id number] scope was introduced through                            the anus and advanced to the the cecum, identified                            by appendiceal orifice and ileocecal valve. The                            colonoscopy was performed without difficulty. The                            patient tolerated the procedure well. The quality                            of the bowel preparation was adequate. The                            ileocecal valve, appendiceal orifice, and rectum                            were photographed. Scope In: 9:32:35 AM Scope Out: 9:50:03 AM Scope Withdrawal Time: 0 hours 7 minutes 59 seconds  Total Procedure Duration: 0 hours 17 minutes 28 seconds  Findings:      The perianal and digital rectal examinations were normal.      Scattered diverticula were found in the sigmoid colon.      The exam was otherwise normal throughout the examined colon.      External hemorrhoids were found during retroflexion. The hemorrhoids       were small. Impression:               - Diverticulosis in the sigmoid colon.                           -  External hemorrhoids.                           - No specimens collected. Moderate Sedation:      Moderate (conscious) sedation was administered by the endoscopy nurse       and supervised by the endoscopist. The following parameters were       monitored: oxygen saturation, heart rate, blood pressure, CO2       capnography and response to care. Total physician intraservice time was       22 minutes. Recommendation:           - Patient has a contact number available for                            emergencies. The signs and symptoms of potential                            delayed complications were discussed with the                            patient. Return to normal activities  tomorrow.                            Written discharge instructions were provided to the                            patient.                           - High fiber diet today.                           - Continue present medications.                           - No repeat colonoscopy due to age and the absence                            of colonic polyps. Procedure Code(s):        --- Professional ---                           [phone], Colonoscopy, flexible; diagnostic, including                            collection of specimen(s) by brushing or washing,                            when performed (separate procedure)                           G0500, Moderate sedation services provided by the                            same physician or other qualified health care  professional performing a gastrointestinal                            endoscopic service that sedation supports,                            requiring the presence of an independent trained                            observer to assist in the monitoring of the                            patient's level of consciousness and physiological                            status; initial 15 minutes of intra-service time;                            patient age 54 years or older (additional time may                            be reported with 90240, as appropriate) Diagnosis Code(s):        --- Professional ---                           Z12.11, Encounter for screening for malignant                            neoplasm of colon                           K64.4, Residual hemorrhoidal skin tags                           K57.30, Diverticulosis of large intestine without                            perforation or abscess without bleeding CPT copyright 2019 American Medical Association. All rights reserved. The codes documented in this report are preliminary and upon coder review may  be revised to meet current compliance  requirements. Lionel December, MD Lionel December, MD 01/30/2021 10:01:25 AM This report has been signed electronically. Number of Addenda: 0

## 2021-02-04 ENCOUNTER — Encounter (HOSPITAL_COMMUNITY): Payer: Self-pay | Admitting: Internal Medicine

## 2021-02-25 ENCOUNTER — Telehealth: Payer: Self-pay | Admitting: Cardiovascular Disease

## 2021-02-25 NOTE — Telephone Encounter (Signed)
Patient is requesting a carotid order. She is hoping to have it at the end of August.

## 2021-02-25 NOTE — Telephone Encounter (Signed)
Spoke to patient .  Annual appointment  Has been se t up for aug 26,2022.  patient is aware will defer to Dr Morrie Sheldon if carotid doppler is needed.

## 2021-02-25 NOTE — Telephone Encounter (Signed)
SPOKE TO FEMALE FAMILY MEMBER. HE STATES HE WILL CALL HER TO TXET HER TO CALL BACK  RN will give this information to patient when she calls back.   PER DR Tresa Endo, last note 8/26/ 2021- mention previous carotid on 05/31/20- does not indicate an carotid is needed this year but will defer to Dr Tresa Endo . Patient also needs to make an appointment for annual  For Aug 2022.

## 2021-02-28 NOTE — Telephone Encounter (Signed)
Spoke to patient is aware no carotid doppler is need prior to appointment this year. She will keep appointment 06/06/21.

## 2021-02-28 NOTE — Telephone Encounter (Signed)
Only minimal plaque has been noted on her prior carotid 70s over the past several years.  She probably does not need a repeat study prior to her office visit.

## 2021-05-20 ENCOUNTER — Other Ambulatory Visit: Payer: Self-pay | Admitting: Family Medicine

## 2021-05-20 DIAGNOSIS — Z1231 Encounter for screening mammogram for malignant neoplasm of breast: Secondary | ICD-10-CM

## 2021-05-29 ENCOUNTER — Ambulatory Visit
Admission: RE | Admit: 2021-05-29 | Discharge: 2021-05-29 | Disposition: A | Discharge status: Home / Self Care | Payer: Medicare Other | Source: Ambulatory Visit | Attending: Family Medicine | Admitting: Family Medicine

## 2021-05-29 ENCOUNTER — Other Ambulatory Visit: Payer: Self-pay

## 2021-05-29 DIAGNOSIS — Z1231 Encounter for screening mammogram for malignant neoplasm of breast: Secondary | ICD-10-CM

## 2021-06-06 ENCOUNTER — Ambulatory Visit (INDEPENDENT_AMBULATORY_CARE_PROVIDER_SITE_OTHER): Payer: Medicare Other | Admitting: Cardiovascular Disease

## 2021-06-06 ENCOUNTER — Other Ambulatory Visit: Payer: Self-pay

## 2021-06-06 DIAGNOSIS — R011 Cardiac murmur, unspecified: Secondary | ICD-10-CM

## 2021-06-06 DIAGNOSIS — I5189 Other ill-defined heart diseases: Secondary | ICD-10-CM

## 2021-06-06 DIAGNOSIS — I779 Disorder of arteries and arterioles, unspecified: Secondary | ICD-10-CM

## 2021-06-06 DIAGNOSIS — R002 Palpitations: Secondary | ICD-10-CM

## 2021-06-06 DIAGNOSIS — I10 Essential (primary) hypertension: Secondary | ICD-10-CM | POA: Diagnosis not present

## 2021-06-06 DIAGNOSIS — E785 Hyperlipidemia, unspecified: Secondary | ICD-10-CM

## 2021-06-06 NOTE — Progress Notes (Signed)
Cardiology Office Note    Date:  06/07/2021   ID:  CHERYLL KEISLER, DOB 24-Oct-1945, MRN [id number]  PCP:  Lemmie Evens, MD  Cardiologist:  Shelva Majestic, MD    History of Present Illness:  Sandra Henry is a 75 y.o. female who presents for a 12 month follow-up cardiology evaluation.   Ms. 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  She has a history of hypertension  She has a history of hyperlipidemia and had been taking Crestor 10 mg 3 times per week.  In October 2017, she developed symptoms of vertigo associated with nausea or vomiting and ultimately was evaluated by Dr. Ivan Croft of ENT, as well as by Dr. Floyde Parkins neurology.  She was diagnosed as having migraines headaches.  She was started on meclizine.  On November 30.  She was awakened from sleep at around 5 AM with palpitations which persisted.  She ultimately presented to the [facility] emergency room where she was found to have tachycardia.  Her ECG reports sinus tachycardia with PACs, but by my review, the EKG shows a at topical low atrial tachycardia, etiology rather than sinus tachycardia.  At that time, she was advised to stop losartan, which had been 25 mg and to initiate Toprol-XL 25 mg daily.  On this therapy, she does note improvement.  However, occasionally she had awakened in the very early morning and has noticed a jittery sensation in her heart rate.  She denied any episodes of chest pain.  She denies any awareness of sleep apnea.  According to her husband, however, she does snore mildly.  She typically gets up one night 1 time per night.  She denied significant nocturia.  She denied excessive daytime sleepiness.    When I initially saw her, I increased her Toprol-XL to 25 mg in the morning and evening.  An echo Doppler study, an EF of 55-60%.  There was grade 2 diastolic dysfunction.  There was mild AR and showed evidence for mitral annular calcification with trivial  MR.  She underwent laboratory.  Renal function was normal.  Left use were normal.  Lipid studies revealed a total cholesterol 172, triglycerides 161, HDL 39, increased VLDL at 32, and LDL at 101.   When I saw her in October 2018  she had continued to feel well with reference to palpitations and had not experienced any since initiating Toprol.  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  She had antegrade flow and normal waveforms in her vertebrals.  She had undergone laboratory by Dr. Karie Kirks on 07/06/2017.  Total cholesterol was 152, triglycerides 110, HDL 46, and LDL 85.  She has continued to take rosuvastatin 10 mg.  She has an on Toprol-XL 37.5 mg in the morning and 25 mg in the evening.  She also is on losartan 50 mg twice a day, and has rarely taken HCTZ for leg swelling.    I saw her in October 2019 at which time she was remaining stable from a cardiac standpoint.  She had had issues with low back discomfort and has seen Dr. Glenna Fellows on 3 occasions.  She has lumbar spondylosis with myelopathy and has been undergoing physical therapy.  There has been some improvement but if this does not significantly improve further she may ultimately require fusion surgery.  On July 29, 2018 she underwent a year follow-up  carotid Doppler study.  This remained stable and only showed very minimal plaque.  She had normal flow hemodynamics and subclavian as well as vertebral arteries.  She had laboratory on July 25, 2018 by Dr. Karie Kirks.  Total cholesterol was 146, HDL 42.  Triglycerides 122.  LDL cholesterol 82.  In the past she had been on rosuvastatin at 20 mg, reduce this down to 10 mg and recently has been able to tolerate 15 mg daily.  She had developed some mild discomfort in the left calf region and apparently had Doppler study done several weeks back which was negative for DVT.  She denied chest  pain PND palpitations presyncope or syncope.  She was sleeping well.    I saw her November 2020 and since her prior evaluation she continued to be stable.   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  Recently, she has started to notice her blood pressure in the early morning being elevated normal blood pressure remaining portion of the day.  She has been on a regimen consisting of losartan 50 mg twice a day, metoprolol succinate 37.5 mg twice a day.  She has a prescription for HCTZ but has not taken any since she has not had any edema.  He also has a history of hyperlipidemia and was supposed to be on rosuvastatin 20 mg.  However over the past 6 months she has only been taking 15 mg daily.  Recent laboratory done in [place] had shown an LDL cholesterol at 81.  Cholesterol was 147 triglycerides 127.  She had normal LFTs and renal function.  Hemoglobin hematocrit were stable.  She has been working with her husband who is a Theme park manager in [employer].  During that evaluation I recommended a retrial of increased rosuvastatin of 20 mg I last saw her in August 2021.  Over the prior year she continued to do well.  She was evaluated by Dr. Karie Kirks in July 2021 laboratory was obtained.  Total cholesterol was 161 LDL 94 triglycerides 118.  Fasting glucose was 90.  Renal function was stable with a creatinine of 0.76.  Hemoglobin hematocrit were stable at 14.4 and 45.6, she states her blood pressure typically has been running in the 140-150 range in the morning and oftentimes and stabilizes at 130 today.  She was taking amlodipine 2.5 mg in addition to her losartan 50 mg twice a day metoprolol succinate 37.5 mg twice a day, HCTZ 12.5 mg as needed and rosuvastatin 20 mg.    Over the past year, she has remained stable and continues to work for Dr. Karie Kirks.  She recently underwent colonoscopy with Dr. Laural Golden.  She admits to some occasional ankle  swelling.  She had laboratory on May 23, 2021.  Total cholesterol 152, HDL 50, triglycerides 79, and LDL 85.  BUN was 26 creatinine 0.8.  TSH normal at 2.89.  Hemoglobin and hematocrit stable.  She brought with her blood pressure readings from home blood pressures typically have been stable in the 120s to 130s with a rare 353 systolic pressure.        Past Medical History:  Diagnosis Date   Dental bridge present    upper   Dental crowns present    Dizziness and giddiness    Glaucoma    Hypercalcemia    Hypertension    under control with med., has been on med. x 4 yr.   Migraine 07/25/2014  Palpitations    Seasonal allergies    Stenosing tenosynovitis of finger of right hand 02/2014   index, middle and ring fingers    Past Surgical History:  Procedure Laterality Date   BREAST BIOPSY Left    CATARACT EXTRACTION W/ INTRAOCULAR LENS IMPLANT Bilateral    COLONOSCOPY N/A 01/30/2021   Procedure: COLONOSCOPY;  Surgeon: Rogene Houston, MD;  [facility];  Service: Endoscopy;  Laterality: N/A;  am   TRIGGER FINGER RELEASE Right 02/20/2014   Procedure: RELEASE A-1 PULLEY RIGHT INDEX,  MIDDLE AND RING FINGERS ;  Surgeon: Wynonia Sours, MD;  Location: [place];  Service: Orthopedics;  Laterality: Right;   TUBAL LIGATION     laparoscopic    Current Medications: Outpatient Medications Prior to Visit  Medication Sig Dispense Refill   Ascorbic Acid (VITAMIN C) 1000 MG tablet Take 1,000 mg by mouth daily.     aspirin 81 MG tablet Take 81 mg by mouth daily.     Calcium-Magnesium-Vitamin D 600-40-500 MG-MG-UNIT TB24 Take 1 tablet by mouth 2 (two) times daily.     cholecalciferol (VITAMIN D) 400 UNITS TABS tablet Take 400 Units by mouth 2 (two) times daily.     Garlic 10 MG CAPS Take 10 mg by mouth daily.     Glucosamine-Chondroitin 500-400 MG CAPS Take 2 tablets by mouth daily.     LORazepam (ATIVAN) 1 MG tablet Take 0.5 mg by mouth 2 (two) times daily.      losartan (COZAAR) 50 MG tablet Take 50 mg by mouth 2 (two) times daily.  3   metoprolol succinate (TOPROL-XL) 25 MG 24 hr tablet Take 37.5 mg by mouth 2 (two) times daily.      Multiple Vitamin (MULTIVITAMIN) tablet Take 1 tablet by mouth daily.     Netarsudil-Latanoprost (ROCKLATAN) 0.02-0.005 % SOLN Place 1 drop into both eyes at bedtime.     Omega-3 Fatty Acids (FISH OIL) 1000 MG CAPS Take 1,000 mg by mouth daily.     rosuvastatin (CRESTOR) 20 MG tablet Take 1 tablet (20 mg total) by mouth daily after breakfast. (Patient taking differently: Take 15 mg by mouth at bedtime.) 90 tablet 3   timolol (TIMOPTIC) 0.5 % ophthalmic solution Place 1 drop into both eyes every morning.     amLODipine (NORVASC) 5 MG tablet Take 1 tablet (5 mg total) by mouth daily. (Patient taking differently: Take 5 mg by mouth at bedtime.) 90 tablet 3   No facility-administered medications prior to visit.     Allergies:   Codeine, Penicillins, and Sulfa antibiotics   Social History   Socioeconomic History   Marital status: Married    Spouse name: Not on file   Number of children: Not on file   Years of education: Not on file   Highest education level: Not on file  Occupational History   Occupation: medical receptionist    Employer: [employer]  Tobacco Use   Smoking status: Never   Smokeless tobacco: Never  Vaping Use   Vaping Use: Never used  Substance and Sexual Activity   Alcohol use: No   Drug use: No   Sexual activity: Not on file  Other Topics Concern   Not on file  Social History Narrative   Lives at home w/ her husband   Left-handed   Caffeine: occasional soft drink   Social Determinants of Health   Financial Resource Strain: Not on file  Food Insecurity: Not on file  Transportation Needs:  Not on file  Physical Activity: Not on file  Stress: Not on file  Social Connections: Not on file     Family History:  The patient's family history includes Heart attack in her brother; Migraines  in her mother; Stroke in her father, mother, and sister.   ROS General: Negative; No fevers, chills, or night sweats;  HEENT: Negative; No changes in vision or hearing, sinus congestion, difficulty swallowing Pulmonary: Negative; No cough, wheezing, shortness of breath, hemoptysis Cardiovascular: Negative; No chest pain, presyncope, syncope, palpitations GI: Negative; No nausea, vomiting, diarrhea, or abdominal pain GU: Negative; No dysuria, hematuria, or difficulty voiding Musculoskeletal: Lumbar disc disease with myelopathy Hematologic/Oncology: Negative; no easy bruising, bleeding Endocrine: Negative; no heat/cold intolerance; no diabetes Neuro: positive for recent diagnosis of migraine headaches and vertigo Skin: Negative; No rashes or skin lesions Psychiatric: Negative; No behavioral problems, depression Sleep: Negative; No snoring, daytime sleepiness, hypersomnolence, bruxism, restless legs, hypnogognic hallucinations, no cataplexy Other comprehensive 14 point system review is negative.   PHYSICAL EXAM:   VS:  BP 116/62 (BP Location: Right Arm, Patient Position: Sitting, Cuff Size: Normal)   Ht 5' 4"  (1.626 m)   Wt 124 lb 12.8 oz (56.6 kg)   SpO2 99%   BMI 21.42 kg/m     Repeat blood pressure by me was 124/68.  Wt Readings from Last 3 Encounters:  06/06/21 124 lb 12.8 oz (56.6 kg)  01/30/21 124 lb (56.2 kg)  06/06/20 128 lb (58.1 kg)    General: Alert, oriented, no distress.  Skin: normal turgor, no rashes, warm and dry HEENT: Normocephalic, atraumatic. Pupils equal round and reactive to light; sclera anicteric; extraocular muscles intact; Nose without nasal septal hypertrophy Mouth/Parynx benign; Mallinpatti scale 2 Neck: No JVD, no carotid bruits; normal carotid upstroke Lungs: clear to ausculatation and percussion; no wheezing or rales Chest wall: without tenderness to palpitation Heart: PMI not displaced, RRR, s1 s2 normal, 2-8/ systolic ejection murmur in the  aortic area, no diastolic murmur, no rubs, gallops, thrills, or heaves Abdomen: soft, nontender; no hepatosplenomehaly, BS+; abdominal aorta nontender and not dilated by palpation. Back: no CVA tenderness Pulses 2+ Musculoskeletal: full range of motion, normal strength, no joint deformities Extremities: no clubbing cyanosis or edema, Homan's sign negative  Neurologic: grossly nonfocal; Cranial nerves grossly wnl Psychologic: Normal mood and affect   Studies/Labs Reviewed:   June 06, 2021 ECG (independently read by me): NSR at 64;  August 2021 ECG (independently read by me): Normal sinus rhythm at 65 bpm.  Nonspecific ST changes.  No ectopy.  Normal intervals.  November 2020 ECG (independently read by me): NSR at 63; normal intervals, no ectopy  October 2019 ECG (independently read by me): Normal sinus rhythm at 72 bpm.  No ectopy.  Normal intervals.  October 2018 ECG (independently read by me): Normal sinus rhythm at 63 bpm.  No ST segment changes.  Normal intervals.  February 2018 ECG (independently read by me): Normal sinus rhythm at 75 bpm.  Normal intervals.  Nonspecific ST changes.  December 2017 ECG (independently read by me): Normal sinus rhythm at 77 bpm.  PR interval 174 ms, QTc interval 432 ms.  I personally reviewed the ECG from 09/10/2016, and in contrast to the report which reads sinus tachycardia with PACs, the patient is in a low atrial ectopic tachycardic rhythm and then developed sinus rhythm after a pause followed by recurrent low atrial rhythm.  There are PACs.  Recent Labs: BMP Latest Ref Rng & Units 10/13/2016 09/10/2016  07/12/2016  Glucose 65 - 99 mg/dL 80 93 118(H)  BUN 7 - 25 mg/dL 18 28(H) 22(H)  Creatinine 0.60 - 0.93 mg/dL 0.78 0.95 0.93  Sodium 135 - 146 mmol/L 140 141 140  Potassium 3.5 - 5.3 mmol/L 4.1 4.0 3.7  Chloride 98 - 110 mmol/L 107 109 106  CO2 20 - 31 mmol/L 26 25 24   Calcium 8.6 - 10.4 mg/dL 9.6 10.0 10.1     Hepatic Function Latest Ref  Rng & Units 10/13/2016 09/10/2016 07/12/2016  Total Protein 6.1 - 8.1 g/dL 7.3 6.9 7.7  Albumin 3.6 - 5.1 g/dL 4.1 3.6 3.4(L)  AST 10 - 35 U/L 18 20 15   ALT 6 - 29 U/L 17 20 12(L)  Alk Phosphatase 33 - 130 U/L 46 54 64  Total Bilirubin 0.2 - 1.2 mg/dL 0.4 0.2(L) 0.1(L)    CBC Latest Ref Rng & Units 09/10/2016 09/07/2016 07/12/2016  WBC 4.0 - 10.5 K/uL 7.3 6.0 7.6  Hemoglobin 12.0 - 15.0 g/dL 13.5 13.2 12.7  Hematocrit 36.0 - 46.0 % 41.0 39.2 38.9  Platelets 150 - 400 K/uL 330 332 584(H)   Lab Results  Component Value Date   MCV 93.4 09/10/2016   MCV 94.0 09/07/2016   MCV 95.3 07/12/2016   Lab Results  Component Value Date   TSH 3.661 09/10/2016   No results found for: HGBA1C   BNP No results found for: BNP  ProBNP No results found for: PROBNP   Lipid Panel     Component Value Date/Time   CHOL 172 10/13/2016 0852   TRIG 161 (H) 10/13/2016 0852   HDL 39 (L) 10/13/2016 0852   CHOLHDL 4.4 10/13/2016 0852   VLDL 32 (H) 10/13/2016 0852   LDLCALC 101 (H) 10/13/2016 4665     Lipid study obtained in Dr. Vickey Sages office on 03/26/2016:  Total cholesterol 186, HDL 48, triglycerides 107, LDL 117.  RADIOLOGY: MM 3D SCREEN BREAST BILATERAL  Result Date: 05/30/2021 CLINICAL DATA:  Screening. EXAM: DIGITAL SCREENING BILATERAL MAMMOGRAM WITH TOMOSYNTHESIS AND CAD TECHNIQUE: Bilateral screening digital craniocaudal and mediolateral oblique mammograms were obtained. Bilateral screening digital breast tomosynthesis was performed. The images were evaluated with computer-aided detection. COMPARISON:  Previous exam(s). ACR Breast Density Category c: The breast tissue is heterogeneously dense, which may obscure small masses. FINDINGS: There are no findings suspicious for malignancy. IMPRESSION: No mammographic evidence of malignancy. A result letter of this screening mammogram will be mailed directly to the patient. RECOMMENDATION: Screening mammogram in one year. (Code:SM-B-01Y) BI-RADS  CATEGORY  1: Negative. Electronically Signed   By: Audie Pinto M.D.   On: 05/30/2021 09:20      Additional studies/ records that were reviewed today include:  I reviewed the patient's emergency room evaluations.  I have also reviewed Dr. Jannifer Franklin is neurologic evaluation and Dr. Vickey Sages notes.  I reviewed her recent laboratory from 07/06/2017.  I reviewed the records of Dr. Glenna Fellows.  I reviewed recent laboratory and her carotid studies.  I reviewed records from her evaluation by Lenon Oms, NP at Dr. Vickey Sages office.  ASSESSMENT:    1. Primary hypertension   2. Murmur   3. Heart palpitations   4. Grade II diastolic dysfunction   5. Hyperlipidemia with target LDL less than 70   6. Mild carotid artery disease [facility])    PLAN:  Ms. Shayle Donahoo is a 75 year-old female who has a history of hypertension, hyperlipidemia, and had developed episodes of palpitations and increased heart rate. When I initially  saw her she had noticed her episodes at approximately 5 AM in the morning, which raised the possibility of obstructive sleep apnea contributing  to her symptomatology.  Her ECG from her emergency room evaluation  suggested ectopic atrial tachycardia rather than sinus tachycardia.  Symptoms have significantly improved with initiation of Toprol-XL which was titrated up to Toprol to 37.5 mg twice a day.  On an echo study in December 2017 she was found to have normal systolic function with grade 2 diastolic dysfunction.  Presently she denies any recurrent palpitations on metoprolol succinate 37.5 mg twice a day and her blood pressure continues to be well controlled with concomitant amlodipine 5 mg and losartan 50 mg twice a day.  I reviewed her most recent laboratory.  Creatinine is stable at 0.80.  LDL cholesterol is 85 and she continues to tolerate rosuvastatin.  Presently there are no myalgias she is on lorazepam for anxiety.  She has had some issues with ankle swelling.  I have  suggested she can take HCTZ 12.5 mg on an as-needed basis.  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  I will contact her regarding her echo findings.  As long as she is stable I will see her in 1 year for reevaluation or sooner as needed.  Medication Adjustments/Labs and Tests Ordered: Current medicines are reviewed at length with the patient today.  Concerns regarding medicines are outlined above.  Medication changes, Labs and Tests ordered today are listed in the Patient Instructions below.  Patient Instructions  Medication Instructions:  No changes *If you need a refill on your cardiac medications before your next appointment, please call your pharmacy*   Lab Work: Not needed    Testing/Procedures:  [facility] has requested that you have an echocardiogram. Echocardiography is a painless test that uses sound waves to create images of your heart. It provides your doctor with information about the size and shape of your heart and how well your heart's chambers and valves are working. This procedure takes approximately one hour. There are no restrictions for this procedure.    Follow-Up: At [facility], you and your health needs are our priority.  As part of our continuing mission to provide you with exceptional heart care, we have created designated Provider Care Teams.  These Care Teams include your primary Cardiologist (physician) and Advanced Practice Providers (APPs -  Physician Assistants and Nurse Practitioners) who all work together to provide you with the care you need, when you need it.  We recommend signing up for the patient portal called "MyChart".  Sign up information is provided on this After Visit Summary.  MyChart is used to connect with patients for Virtual Visits (Telemedicine).   Patients are able to view lab/test results, encounter notes, upcoming appointments, etc.  Non-urgent messages can be sent to your provider as well.   To learn more about what you can do with MyChart, go to [email or web address].    Your next appointment:   12 month(s)  The format for your next appointment:   In Person  Provider:   Shelva Majestic, MD      He  Signed, Shelva Majestic, MD  06/07/2021 4:34 PM    [facility] [address] [place], [place], [place]  [address] Phone: [phone]

## 2021-06-06 NOTE — Patient Instructions (Signed)
Medication Instructions:  No changes *If you need a refill on your cardiac medications before your next appointment, please call your pharmacy*   Lab Work: Not needed    Testing/Procedures:  [address] Your physician has requested that you have an echocardiogram. Echocardiography is a painless test that uses sound waves to create images of your heart. It provides your doctor with information about the size and shape of your heart and how well your heart's chambers and valves are working. This procedure takes approximately one hour. There are no restrictions for this procedure.    Follow-Up: At [facility], you and your health needs are our priority.  As part of our continuing mission to provide you with exceptional heart care, we have created designated Provider Care Teams.  These Care Teams include your primary Cardiologist (physician) and Advanced Practice Providers (APPs -  Physician Assistants and Nurse Practitioners) who all work together to provide you with the care you need, when you need it.  We recommend signing up for the patient portal called "MyChart".  Sign up information is provided on this After Visit Summary.  MyChart is used to connect with patients for Virtual Visits (Telemedicine).  Patients are able to view lab/test results, encounter notes, upcoming appointments, etc.  Non-urgent messages can be sent to your provider as well.   To learn more about what you can do with MyChart, go to [email or web address].    Your next appointment:   12 month(s)  The format for your next appointment:   In Person  Provider:   Nicki Guadalajara, MD

## 2021-06-07 ENCOUNTER — Encounter: Payer: Self-pay | Admitting: Cardiovascular Disease

## 2021-07-03 ENCOUNTER — Other Ambulatory Visit: Payer: Self-pay

## 2021-07-03 ENCOUNTER — Ambulatory Visit (HOSPITAL_COMMUNITY): Payer: Medicare Other | Attending: Cardiology

## 2021-07-03 DIAGNOSIS — R011 Cardiac murmur, unspecified: Secondary | ICD-10-CM | POA: Insufficient documentation

## 2021-07-04 LAB — ECHOCARDIOGRAM COMPLETE
Area-P 1/2: 2.99 cm2
P 1/2 time: 474 msec
S' Lateral: 2.2 cm

## 2021-07-08 ENCOUNTER — Telehealth: Payer: Self-pay | Admitting: Cardiovascular Disease

## 2021-07-08 NOTE — Telephone Encounter (Signed)
New message:    Patient calling to get results ECHO

## 2021-07-08 NOTE — Telephone Encounter (Signed)
Returned call to patient who was calling into get Echo results. Advised patient that results have not been reviewed by Dr. Tresa Endo yet as he is out of the office but advised that once Dr. Tresa Endo has reviewed it that someone will reach out regarding results. Patient verbalized understanding.

## 2021-07-10 NOTE — Telephone Encounter (Signed)
Pt called stating it's been over a week and was assuming she would have heard something regarding her ECHO results. Nurse informed pt once Dr. Tresa Endo review them, we we would call her with results. Pt verbalized understanding.

## 2021-07-10 NOTE — Telephone Encounter (Signed)
Patient is frustrated that no one has called bout her results. Please advise

## 2021-07-14 NOTE — Telephone Encounter (Signed)
Called patient, gave response.  Patient verbalized understanding.  Thankful for call back.

## 2021-12-19 ENCOUNTER — Other Ambulatory Visit: Payer: Self-pay

## 2021-12-19 ENCOUNTER — Ambulatory Visit (HOSPITAL_COMMUNITY)
Admission: RE | Admit: 2021-12-19 | Discharge: 2021-12-19 | Disposition: A | Discharge status: Home / Self Care | Payer: Medicare Other | Source: Ambulatory Visit | Attending: Family Medicine | Admitting: Family Medicine

## 2021-12-19 ENCOUNTER — Other Ambulatory Visit (HOSPITAL_COMMUNITY)
Admission: RE | Admit: 2021-12-19 | Discharge: 2021-12-19 | Disposition: A | Discharge status: Home / Self Care | Payer: Medicare Other | Source: Ambulatory Visit | Attending: Family Medicine | Admitting: Family Medicine

## 2021-12-19 ENCOUNTER — Other Ambulatory Visit (HOSPITAL_COMMUNITY): Payer: Self-pay | Admitting: Family Medicine

## 2021-12-19 DIAGNOSIS — R059 Cough, unspecified: Secondary | ICD-10-CM

## 2021-12-19 DIAGNOSIS — R0781 Pleurodynia: Secondary | ICD-10-CM | POA: Diagnosis present

## 2021-12-19 LAB — COMPREHENSIVE METABOLIC PANEL
ALT: 16 U/L (ref 0–44)
AST: 16 U/L (ref 15–41)
Albumin: 4.1 g/dL (ref 3.5–5.0)
Alkaline Phosphatase: 51 U/L (ref 38–126)
Anion gap: 11 (ref 5–15)
BUN: 37 mg/dL — ABNORMAL HIGH (ref 8–23)
CO2: 27 mmol/L (ref 22–32)
Calcium: 9.6 mg/dL (ref 8.9–10.3)
Chloride: 104 mmol/L (ref 98–111)
Creatinine, Ser: 1.55 mg/dL — ABNORMAL HIGH (ref 0.44–1.00)
GFR, Estimated: 35 mL/min — ABNORMAL LOW (ref >60)
Glucose, Bld: 104 mg/dL — ABNORMAL HIGH (ref 70–99)
Potassium: 3.6 mmol/L (ref 3.5–5.1)
Sodium: 142 mmol/L (ref 135–145)
Total Bilirubin: 0.6 mg/dL (ref 0.3–1.2)
Total Protein: 8.3 g/dL — ABNORMAL HIGH (ref 6.5–8.1)

## 2021-12-19 LAB — CBC
HCT: 40.9 % (ref 36.0–46.0)
Hemoglobin: 13.7 g/dL (ref 12.0–15.0)
MCH: 32.1 pg (ref 26.0–34.0)
MCHC: 33.5 g/dL (ref 30.0–36.0)
MCV: 95.8 fL (ref 80.0–100.0)
Platelets: 321 10*3/uL (ref 150–400)
RBC: 4.27 MIL/uL (ref 3.87–5.11)
RDW: 11.9 % (ref 11.5–15.5)
WBC: 7.6 10*3/uL (ref 4.0–10.5)
nRBC: 0 % (ref 0.0–0.2)

## 2022-03-18 ENCOUNTER — Telehealth: Payer: Self-pay | Admitting: Cardiovascular Disease

## 2022-03-18 NOTE — Telephone Encounter (Signed)
Patient c/o Palpitations:  High priority if patient c/o lightheadedness, shortness of breath, or chest pain  How long have you had palpitations/irregular HR/ Afib? Are you having the symptoms now? Palpitations- but not at this time  Are you currently experiencing lightheadedness, SOB or CP? no  Do you have a history of afib (atrial fibrillation) or irregular heart rhythm?   Have you checked your BP or HR? (document readings if available): blood pressure  been good for the last few days  Are you experiencing any other symptoms? Real weird weak feeling when she have the palpitations- most of her palpitations after she gets up- Patient wants to see Dr Lenward Chancellor asap- she does not want to see a PA

## 2022-03-18 NOTE — Telephone Encounter (Signed)
Patient reports that for 3 days, recently, she has bouts of palpitations; feeling weak and jittery. She has not been sob, having chest pain or dizziness. She stated she is under a lot of stress, having recently downsized. She is staying hydrated and avoiding afib triggers. Current BP 144/70, P 66. Over the past 3 days, she has not felt palpitations. Advised patient that if palpitations return with SOB or dizziness, she needs to go to the ED. She gave her new address as  [address] [place], [place] [address].

## 2022-03-18 NOTE — Telephone Encounter (Signed)
Follow Up:     Patient said she forgot to add she feels jittery and she have been under a lot of stress.

## 2022-04-03 NOTE — Telephone Encounter (Signed)
Patient scheduled to be seen on 06/26 to discuss further.

## 2022-04-06 ENCOUNTER — Ambulatory Visit (INDEPENDENT_AMBULATORY_CARE_PROVIDER_SITE_OTHER): Payer: Medicare Other | Admitting: Cardiovascular Disease

## 2022-04-06 ENCOUNTER — Encounter: Payer: Self-pay | Admitting: Cardiovascular Disease

## 2022-04-06 VITALS — BP 110/70 | HR 60 | Ht 64.0 in | Wt 125.2 lb

## 2022-04-06 DIAGNOSIS — I10 Essential (primary) hypertension: Secondary | ICD-10-CM

## 2022-04-06 DIAGNOSIS — I358 Other nonrheumatic aortic valve disorders: Secondary | ICD-10-CM

## 2022-04-06 DIAGNOSIS — I779 Disorder of arteries and arterioles, unspecified: Secondary | ICD-10-CM

## 2022-04-06 DIAGNOSIS — E785 Hyperlipidemia, unspecified: Secondary | ICD-10-CM

## 2022-04-06 DIAGNOSIS — I471 Supraventricular tachycardia: Secondary | ICD-10-CM

## 2022-04-06 DIAGNOSIS — R002 Palpitations: Secondary | ICD-10-CM

## 2022-04-06 DIAGNOSIS — I4719 Other supraventricular tachycardia: Secondary | ICD-10-CM

## 2022-04-30 ENCOUNTER — Other Ambulatory Visit: Payer: Self-pay | Admitting: Family Medicine

## 2022-04-30 DIAGNOSIS — Z1231 Encounter for screening mammogram for malignant neoplasm of breast: Secondary | ICD-10-CM

## 2022-05-11 ENCOUNTER — Telehealth: Payer: Self-pay | Admitting: Cardiovascular Disease

## 2022-05-11 NOTE — Telephone Encounter (Signed)
Patient reports that according to her BP monitor,she has irregular heart beats. She denies sob, edema, lightheadedness. She is staying hydrated and avoids Na+ and caffeine. She started a new job [employer]) 3 weeks ago and gets anxious with the new computer system. She believe she is "doing this to myself". I explained there is always a learning curve with new jobs and to be patient with herself. She will being working one day per week starting tomorrow. She wants to get out of the house every now and then and is grateful for this job. Recommended that if she gets sob, has swelling, chest pain, or lightheadedness, she is to go to the ED. She verbalized understanding. She has appointment with Dr. Tresa Endo on 05/25/22.

## 2022-05-11 NOTE — Telephone Encounter (Signed)
Patient c/o Palpitations:  High priority if patient c/o lightheadedness, shortness of breath, or chest pain  How long have you had palpitations/irregular HR/ Afib? Are you having the symptoms now? Has been having palpitations everyday for past week/week and a half. Not having palpitations now.    Are you currently experiencing lightheadedness, SOB or CP? No   Do you have a history of afib (atrial fibrillation) or irregular heart rhythm? Yes   Have you checked your BP or HR? (document readings if available): Has not checked BP today   Are you experiencing any other symptoms? Feels tense/anxious    Patient states she recently started a new job that has been stressful which could be causing these symptoms.

## 2022-05-12 NOTE — Telephone Encounter (Signed)
Noted. Thanks!  Appt 08/14

## 2022-05-25 ENCOUNTER — Ambulatory Visit (INDEPENDENT_AMBULATORY_CARE_PROVIDER_SITE_OTHER): Payer: Medicare Other | Admitting: Cardiovascular Disease

## 2022-05-25 ENCOUNTER — Other Ambulatory Visit (HOSPITAL_COMMUNITY): Payer: Self-pay | Admitting: Nurse Practitioner

## 2022-05-25 ENCOUNTER — Encounter: Payer: Self-pay | Admitting: Cardiovascular Disease

## 2022-05-25 DIAGNOSIS — I10 Essential (primary) hypertension: Secondary | ICD-10-CM

## 2022-05-25 DIAGNOSIS — N1832 Chronic kidney disease, stage 3b: Secondary | ICD-10-CM

## 2022-05-25 DIAGNOSIS — E785 Hyperlipidemia, unspecified: Secondary | ICD-10-CM

## 2022-05-25 DIAGNOSIS — I358 Other nonrheumatic aortic valve disorders: Secondary | ICD-10-CM | POA: Diagnosis not present

## 2022-05-25 DIAGNOSIS — I5189 Other ill-defined heart diseases: Secondary | ICD-10-CM

## 2022-05-25 DIAGNOSIS — Z780 Asymptomatic menopausal state: Secondary | ICD-10-CM

## 2022-05-25 DIAGNOSIS — R002 Palpitations: Secondary | ICD-10-CM

## 2022-05-25 NOTE — Progress Notes (Signed)
Cardiology Office Note    Date:  05/30/2022   ID:  Sandra Henry, DOB Feb 22, 1946, MRN [id number]  PCP:  Sandra Evens, MD  Cardiologist:  Sandra Majestic, MD   11-month follow-up cardiology evaluation   History of Present Illness:  Sandra Henry is a 76 y.o. female who presents for a follow-up cardiology evaluation.   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  She has a history of hypertension  She has a history of hyperlipidemia and had been taking Crestor 10 mg 3 times per week.  In October 2017, she developed symptoms of vertigo associated with nausea or vomiting and ultimately was evaluated by Dr. Ivan Henry of ENT, as well as by Dr. Floyde Henry neurology.  She was diagnosed as having migraines headaches.  She was started on meclizine.  On November 30.  She was awakened from sleep at around 5 AM with palpitations which persisted.  She ultimately presented to the [facility] emergency room where she was found to have tachycardia.  Her ECG reports sinus tachycardia with PACs, but by my review, the EKG shows a at topical low atrial tachycardia, etiology rather than sinus tachycardia.  At that time, she was advised to stop losartan, which had been 25 mg and to initiate Toprol-XL 25 mg daily.  On this therapy, she does note improvement.  However, occasionally she had awakened in the very early morning and has noticed a jittery sensation in her heart rate.  She denied any episodes of chest pain.  She denies any awareness of sleep apnea.  According to her husband, however, she does snore mildly.  She typically gets up one night 1 time per night.  She denied significant nocturia.  She denied excessive daytime sleepiness.    When I initially saw her, I increased her Toprol-XL to 25 mg in the morning and evening.  An echo Doppler study, an EF of 55-60%.  There was grade 2 diastolic dysfunction.  There was mild AR  and showed evidence for mitral annular calcification with trivial MR.  She underwent laboratory.  Renal function was normal.  Left use were normal.  Lipid studies revealed a total cholesterol 172, triglycerides 161, HDL 39, increased VLDL at 32, and LDL at 101.   When I saw her in October 2018  she had continued to feel well with reference to palpitations and had not experienced any since initiating Toprol.  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  She had antegrade flow and normal waveforms in her vertebrals.  She had undergone laboratory by Dr. Karie Henry on 07/06/2017.  Total cholesterol was 152, triglycerides 110, HDL 46, and LDL 85.  She has continued to take rosuvastatin 10 mg.  She has an on Toprol-XL 37.5 mg in the morning and 25 mg in the evening.  She also is on losartan 50 mg twice a day, and has rarely taken HCTZ for leg swelling.    I saw her in October 2019 at which time she was remaining stable from a cardiac standpoint.  She had had issues with low back discomfort and has seen Dr. Glenna Henry on 3 occasions.  She has lumbar spondylosis with myelopathy and has been undergoing physical therapy.  There has been some improvement but if this does not significantly improve further she may ultimately require fusion surgery.  On  July 29, 2018 she underwent a year follow-up carotid Doppler study.  This remained stable and only showed very minimal plaque.  She had normal flow hemodynamics and subclavian as well as vertebral arteries.  She had laboratory on July 25, 2018 by Dr. Karie Henry.  Total cholesterol was 146, HDL 42.  Triglycerides 122.  LDL cholesterol 82.  In the past she had been on rosuvastatin at 20 mg, reduce this down to 10 mg and recently has been able to tolerate 15 mg daily.  She had developed some mild discomfort in the left calf region and apparently had Doppler study done  several weeks back which was negative for DVT.  She denied chest pain PND palpitations presyncope or syncope.  She was sleeping well.    I saw her November 2020 and since her prior evaluation she continued to be stable.   Due to the COVID-19 pandemic, she is no longer working at Dr. Vickey Henry office and previously had been working 2 days/week.  Recently, she has started to notice her blood pressure in the early morning being elevated normal blood pressure remaining portion of the day.  She has been on a regimen consisting of losartan 50 mg twice a day, metoprolol succinate 37.5 mg twice a day.  She has a prescription for HCTZ but has not taken any since she has not had any edema.  He also has a history of hyperlipidemia and was supposed to be on rosuvastatin 20 mg.  However over the past 6 months she has only been taking 15 mg daily.  Recent laboratory done in [place] had shown an LDL cholesterol at 81.  Cholesterol was 147 triglycerides 127.  She had normal LFTs and renal function.  Hemoglobin hematocrit were stable.  She has been working with her husband who is a Theme park manager in [employer].  During that evaluation I recommended a retrial of increased rosuvastatin of 20 mg  I saw her in August 2021.  Over the prior year she continued to do well.  She was evaluated by Dr. Karie Henry in July 2021 laboratory was obtained.  Total cholesterol was 161 LDL 94 triglycerides 118.  Fasting glucose was 90.  Renal function was stable with a creatinine of 0.76.  Hemoglobin hematocrit were stable at 14.4 and 45.6, she states her blood pressure typically has been running in the 140-150 range in the morning and oftentimes and stabilizes at 130 today.  She was taking amlodipine 2.5 mg in addition to her losartan 50 mg twice a day metoprolol succinate 37.5 mg twice a day, HCTZ 12.5 mg as needed and rosuvastatin 20 mg.    I saw her in June 06, 2021 at which time she remained stable and was continuing to work for Dr. Karie Henry in  [place].,  She recently underwent colonoscopy with Sandra Henry.  She admits to some occasional ankle swelling.  She had laboratory on May 23, 2021.  Total cholesterol 152, HDL 50, triglycerides 79, and LDL 85.  BUN was 26 creatinine 0.8.  TSH normal at 2.89.  Hemoglobin and hematocrit stable.  She brought with her blood pressure readings from home blood pressures typically have been stable in the 120s to 130s with a rare 628 systolic pressure.  During that evaluation, I suggested she can take hCG TZ 12.5 mg on an as-needed basis for her ankle edema.  With her 1/6 to 2/6 systolic murmur in the aortic region I recommended that she undergo a follow-up 2D echo Doppler study for further evaluation and  suspected most likely that this was due to aortic sclerosis.  She underwent her 2D echo Doppler study on July 03, 2021.  She was found to have hyperdynamic LV function with EF estimated 65 to 70% without wall motion abnormalities.  There was moderate mitral annular calcification with trivial MR.  Aortic valve was trileaflet and there was evidence for mild aortic sclerosis without stenosis.  There was mild tricuspid regurgitation.  I last saw her on April 06, 2022.  Over the prior year both she and her husband have retired.  They have moved out of the pastoral housing and have bought a house and eating.  She admits that she has been under some increased stress particularly with reduction in income and their new house.  At times she has noted some occasional palpitations which typically are short-lived.  She denies any presyncope or syncope or chest pain.  At times she still notes occasional ankle swelling.  She is no longer working for Dr. Karie Henry but has an appointment to see him as a patient in the upcoming future and laboratory will be checked.  During her evaluation, her EKG was stable with heart rate at 60 and blood pressure was normal.  I felt she was most likely having either PACs or PVCs that were  isolated and recommended that she can take an extra half of metoprolol on an as-needed basis.  Since I saw her, she started a new job working at [employer] for approximately 4 weeks.  Initially, during training, she again had increased palpitations but she admits that she was under increased stress at that time.  Over the last several weeks, these essentially have resolved since her stress component has decreased and she is settled into her new job where she works 1 day/week.  She denies any chest pain or shortness of breath.  She is now living in [place].  She presents for reevaluation.   Past Medical History:  Diagnosis Date   Dental bridge present    upper   Dental crowns present    Dizziness and giddiness    Glaucoma    Hypercalcemia    Hypertension    under control with med., has been on med. x 4 yr.   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   index, middle and ring fingers    Past Surgical History:  Procedure Laterality Date   BREAST BIOPSY Left    CATARACT EXTRACTION W/ INTRAOCULAR LENS IMPLANT Bilateral    COLONOSCOPY N/A 01/30/2021   Procedure: COLONOSCOPY;  Surgeon: Rogene Houston, MD;  Location: [facility];  Service: Endoscopy;  Laterality: N/A;  am   TRIGGER FINGER RELEASE Right 02/20/2014   Procedure: RELEASE A-1 PULLEY RIGHT INDEX,  MIDDLE AND RING FINGERS ;  Surgeon: Wynonia Sours, MD;  Location: [facility];  Service: Orthopedics;  Laterality: Right;   TUBAL LIGATION     laparoscopic    Current Medications: Outpatient Medications Prior to Visit  Medication Sig Dispense Refill   amLODipine (NORVASC) 5 MG tablet Take 1 tablet (5 mg total) by mouth daily. (Patient taking differently: Take 5 mg by mouth at bedtime.) 90 tablet 3   Ascorbic Acid (VITAMIN C) 1000 MG tablet Take 1,000 mg by mouth daily.     aspirin 81 MG tablet Take 81 mg by mouth daily.      Calcium-Magnesium-Vitamin D 600-40-500 MG-MG-UNIT TB24 Take 1 tablet  by mouth 2 (two) times daily.     cholecalciferol (VITAMIN D) 400 UNITS TABS tablet Take 400 Units by mouth 2 (two) times daily.     Garlic 10 MG CAPS Take 10 mg by mouth daily.     Glucosamine-Chondroitin 500-400 MG CAPS Take 2 tablets by mouth daily.     hydrochlorothiazide (HYDRODIURIL) 25 MG tablet Take 25 mg by mouth daily.     LORazepam (ATIVAN) 1 MG tablet Take 0.5 mg by mouth 2 (two) times daily.     losartan (COZAAR) 50 MG tablet Take 50 mg by mouth 2 (two) times daily.  3   metoprolol succinate (TOPROL-XL) 25 MG 24 hr tablet Take 37.5 mg by mouth 2 (two) times daily.      Multiple Vitamin (MULTIVITAMIN) tablet Take 1 tablet by mouth daily.     Netarsudil-Latanoprost (ROCKLATAN) 0.02-0.005 % SOLN Place 1 drop into both eyes at bedtime.     Omega-3 Fatty Acids (FISH OIL) 1000 MG CAPS Take 1,000 mg by mouth daily.     rosuvastatin (CRESTOR) 10 MG tablet Take 10 mg by mouth 2 (two) times daily.     timolol (TIMOPTIC) 0.5 % ophthalmic solution Place 1 drop into both eyes every morning.     rosuvastatin (CRESTOR) 20 MG tablet Take 1 tablet (20 mg total) by mouth daily after breakfast. (Patient taking differently: Take 15 mg by mouth at bedtime.) 90 tablet 3   No facility-administered medications prior to visit.     Allergies:   Codeine, Penicillins, and Sulfa antibiotics   Social History   Socioeconomic History   Marital status: Married    Spouse name: Not on file   Number of children: Not on file   Years of education: Not on file   Highest education level: Not on file  Occupational History   Occupation: medical receptionist    Employer: [employer]  Tobacco Use   Smoking status: Never   Smokeless tobacco: Never  Vaping Use   Vaping Use: Never used  Substance and Sexual Activity   Alcohol use: No   Drug use: No   Sexual activity: Not on file  Other Topics Concern   Not on file  Social History Narrative    Lives at home w/ her husband   Left-handed   Caffeine: occasional soft drink   Social Determinants of Health   Financial Resource Strain: Not on file  Food Insecurity: Not on file  Transportation Needs: Not on file  Physical Activity: Not on file  Stress: Not on file  Social Connections: Not on file     Family History:  The patient's family history includes Heart attack in her brother; Migraines in her mother; Stroke in her father, mother, and sister.   ROS General: Negative; No fevers, chills, or night sweats;  HEENT: Negative; No changes in vision or hearing, sinus congestion, difficulty swallowing Pulmonary: Negative; No cough, wheezing, shortness of breath, hemoptysis Cardiovascular: See HPI GI: Negative; No nausea, vomiting, diarrhea, or abdominal pain GU: Negative; No dysuria, hematuria, or difficulty voiding Musculoskeletal: Lumbar disc disease with myelopathy Hematologic/Oncology: Negative; no easy bruising, bleeding Endocrine: Negative; no heat/cold intolerance; no diabetes Neuro: positive for recent diagnosis of migraine headaches and vertigo Skin: Negative; No rashes or skin lesions Psychiatric: Negative; No behavioral problems, depression Sleep: Negative; No snoring, daytime sleepiness, hypersomnolence, bruxism, restless legs, hypnogognic hallucinations, no cataplexy Other comprehensive 14 point system review is negative.   PHYSICAL EXAM:   VS:  BP (!) 142/58 (BP Location:  Left Arm, Patient Position: Sitting, Cuff Size: Normal)   Pulse 61   Ht $R'5\' 4"'jQ$  (1.626 m)   Wt 126 lb (57.2 kg)   SpO2 97%   BMI 21.63 kg/m     Repeat blood pressure by me was 126/64  Wt Readings from Last 3 Encounters:  05/25/22 126 lb (57.2 kg)  04/06/22 125 lb 3.2 oz (56.8 kg)  06/06/21 124 lb 12.8 oz (56.6 kg)    General: Alert, oriented, no distress.  Skin: normal turgor, no rashes, warm and dry HEENT: Normocephalic, atraumatic. Pupils equal round and reactive to light; sclera  anicteric; extraocular muscles intact;  Nose without nasal septal hypertrophy Mouth/Parynx benign; Mallinpatti scale 3 Neck: No JVD, no carotid bruits; normal carotid upstroke Lungs: clear to ausculatation and percussion; no wheezing or rales Chest wall: without tenderness to palpitation Heart: PMI not displaced, RRR, s1 s2 normal,1- 2/6 systolic murmur in the aortic area consistent with aortic sclerosis; no diastolic murmur, no rubs, gallops, thrills, or heaves Abdomen: soft, nontender; no hepatosplenomehaly, BS+; abdominal aorta nontender and not dilated by palpation. Back: no CVA tenderness Pulses 2+ Musculoskeletal: full range of motion, normal strength, no joint deformities Extremities: no clubbing cyanosis or edema, Homan's sign negative  Neurologic: grossly nonfocal; Cranial nerves grossly wnl Psychologic: Normal mood and affect   Studies/Labs Reviewed:   May 25, 2022    ECG (independently read by me): NSR at 61, NSST changes  April 06, 2022 ECG (independently read by me): Sinus rhythm at 60 bpm.  No ectopy.  Normal intervals.  June 06, 2021 ECG (independently read by me): NSR at 64; no ST changes.  No ectopy.  Normal intervals.  August 2021 ECG (independently read by me): Normal sinus rhythm at 65 bpm.  Nonspecific ST changes.  No ectopy.  Normal intervals.  November 2020 ECG (independently read by me): NSR at 63; normal intervals, no ectopy  October 2019 ECG (independently read by me): Normal sinus rhythm at 72 bpm.  No ectopy.  Normal intervals.  October 2018 ECG (independently read by me): Normal sinus rhythm at 63 bpm.  No ST segment changes.  Normal intervals.  February 2018 ECG (independently read by me): Normal sinus rhythm at 75 bpm.  Normal intervals.  Nonspecific ST changes.  December 2017 ECG (independently read by me): Normal sinus rhythm at 77 bpm.  PR interval 174 ms, QTc interval 432 ms.  I personally reviewed the ECG from 09/10/2016, and in contrast to  the report which reads sinus tachycardia with PACs, the patient is in a low atrial ectopic tachycardic rhythm and then developed sinus rhythm after a pause followed by recurrent low atrial rhythm.  There are PACs.  Recent Labs:    Latest Ref Rng & Units 12/19/2021    2:47 PM 10/13/2016    8:52 AM 09/10/2016    6:19 AM  BMP  Glucose 70 - 99 mg/dL 104  80  93   BUN 8 - 23 mg/dL 37  18  28   Creatinine 0.44 - 1.00 mg/dL 1.55  0.78  0.95   Sodium 135 - 145 mmol/L 142  140  141   Potassium 3.5 - 5.1 mmol/L 3.6  4.1  4.0   Chloride 98 - 111 mmol/L 104  107  109   CO2 22 - 32 mmol/L $RemoveB'27  26  25   'bpMvngxj$ Calcium 8.9 - 10.3 mg/dL 9.6  9.6  10.0         Latest Ref Rng & Units  12/19/2021    2:47 PM 10/13/2016    8:52 AM 09/10/2016    6:19 AM  Hepatic Function  Total Protein 6.5 - 8.1 g/dL 8.3  7.3  6.9   Albumin 3.5 - 5.0 g/dL 4.1  4.1  3.6   AST 15 - 41 U/L $Remo'16  18  20   'kNiCE$ ALT 0 - 44 U/L $Remo'16  17  20   'gkmYw$ Alk Phosphatase 38 - 126 U/L 51  46  54   Total Bilirubin 0.3 - 1.2 mg/dL 0.6  0.4  0.2        Latest Ref Rng & Units 12/19/2021    2:47 PM 09/10/2016    6:19 AM 09/07/2016   11:21 AM  CBC  WBC 4.0 - 10.5 K/uL 7.6  7.3  6.0   Hemoglobin 12.0 - 15.0 g/dL 13.7  13.5  13.2   Hematocrit 36.0 - 46.0 % 40.9  41.0  39.2   Platelets 150 - 400 K/uL 321  330  332    Lab Results  Component Value Date   MCV 95.8 12/19/2021   MCV 93.4 09/10/2016   MCV 94.0 09/07/2016   Lab Results  Component Value Date   TSH 3.661 09/10/2016   No results found for: "HGBA1C"   BNP No results found for: "BNP"  ProBNP No results found for: "PROBNP"   Lipid Panel     Component Value Date/Time   CHOL 172 10/13/2016 0852   TRIG 161 (H) 10/13/2016 0852   HDL 39 (L) 10/13/2016 0852   CHOLHDL 4.4 10/13/2016 0852   VLDL 32 (H) 10/13/2016 0852   LDLCALC 101 (H) 10/13/2016 0459     Lipid study obtained in Dr. Vickey Henry office on 03/26/2016:  Total cholesterol 186, HDL 48, triglycerides 107, LDL  117.  RADIOLOGY: No results found.    Additional studies/ records that were reviewed today include:  I reviewed the patient's emergency room evaluations.  I have also reviewed Dr. Jannifer Franklin is neurologic evaluation and Dr. Vickey Henry notes.  I reviewed her recent laboratory from 07/06/2017.  I reviewed the records of Dr. Glenna Henry.  I reviewed recent laboratory and her carotid studies.  I reviewed records from her evaluation by Lenon Oms, NP at Dr. Vickey Henry office.  ASSESSMENT:    1. Primary hypertension   2. Heart palpitations   3. Aortic valve sclerosis   4. Hyperlipidemia with target LDL less than 70   5. Grade II diastolic dysfunction   6. Stage 3b chronic kidney disease [facility])     PLAN:  Sandra Henry is a  76 year-old female who has a history of hypertension, hyperlipidemia, and had developed episodes of palpitations and increased heart rate. When I initially saw her she had noticed her episodes at approximately 5 AM in the morning, which raised the possibility of obstructive sleep apnea contributing  to her symptomatology.  Her ECG from her emergency room evaluation  suggested ectopic atrial tachycardia rather than sinus tachycardia.  Symptoms significantly improved with initiation of Toprol-XL which was titrated up to Toprol to 37.5 mg twice a day.  On an echo study in December 2017 she was found to have normal systolic function with grade 2 diastolic dysfunction.  When seen in August 2022 she was maintaining normal sinus rhythm and was doing well without palpitations on metoprolol succinate 37.5 mg twice a day, amlodipine 5 mg and losartan 50 mg twice a day.  At that time she had some issues with ankle swelling and HCTZ  12.5 mg was recommended to take on an as-needed basis.  Due to her systolic murmur she underwent a 2D echo Doppler study which continues to show hyperdynamic LV function.  There is evidence for mitral annular calcification with trivial MR, aortic valve  sclerosis without stenosis, and mild TR. I last saw her in June 2023 and over the prior year both she and her husband retired.  Her husband was a Theme park manager and had lived in the home supplied by the church.  They subsequently have bought a home.  This has resulted in some increased stress with palpitations.  When I saw her in June I suggested that she can take an extra metoprolol on an as-needed basis.  Presently, she started a job working at [employer].  During her first week in training, she admitted to being significantly stressed with the new requirement and new position.  This has subsequently subsided and her palpitations have improved.  I suspect these were more anxiety mediated PACs or PVCs.  Her ECG today shows normal sinus rhythm.  Her blood pressure today continues to be stable on her current regimen of amlodipine 5 mg, losartan 50 mg twice a day, and she is now taking metoprolol succinate 37.5 mg twice a day in addition to HCTZ 12.5 mg on a as needed basis depending upon the leg swelling or blood pressure.  She continues to be on rosuvastatin 20 mg and omega-3 fatty acid for hyperlipidemia.  She had seen Dr. Karie Henry who checks laboratory.  Creatinine in March 2023 was 1.55 consistent with stage IIIb CKD.  She will continue her current regimen.  As long as she is stable I will see her in 1 year for reevaluation or sooner as needed.    Medication Adjustments/Labs and Tests Ordered: Current medicines are reviewed at length with the patient today.  Concerns regarding medicines are outlined above.  Medication changes, Labs and Tests ordered today are listed in the Patient Instructions below.  Patient Instructions  Medication Instructions:  Your physician recommends that you continue on your current medications as directed. Please refer to the Current Medication list given to you today.  *If you need a refill on your cardiac medications before your next appointment, please call your  pharmacy*  Follow-Up: At [facility], you and your health needs are our priority.  As part of our continuing mission to provide you with exceptional heart care, we have created designated Provider Care Teams.  These Care Teams include your primary Cardiologist (physician) and Advanced Practice Providers (APPs -  Physician Assistants and Nurse Practitioners) who all work together to provide you with the care you need, when you need it.  We recommend signing up for the patient portal called "MyChart".  Sign up information is provided on this After Visit Summary.  MyChart is used to connect with patients for Virtual Visits (Telemedicine).  Patients are able to view lab/test results, encounter notes, upcoming appointments, etc.  Non-urgent messages can be sent to your provider as well.   To learn more about what you can do with MyChart, go to [email or web address].    Your next appointment:   12 month(s)  The format for your next appointment:   In Person  Provider:   Shelva Majestic, MD {   Important Information About Sugar        He  Signed, Sandra Majestic, MD  05/30/2022 9:25 AM    [facility] [address], [place], [place]  [address] Phone: [phone]

## 2022-05-25 NOTE — Patient Instructions (Signed)
Medication Instructions:  Your physician recommends that you continue on your current medications as directed. Please refer to the Current Medication list given to you today.  *If you need a refill on your cardiac medications before your next appointment, please call your pharmacy*  Follow-Up: At [facility], you and your health needs are our priority.  As part of our continuing mission to provide you with exceptional heart care, we have created designated Provider Care Teams.  These Care Teams include your primary Cardiologist (physician) and Advanced Practice Providers (APPs -  Physician Assistants and Nurse Practitioners) who all work together to provide you with the care you need, when you need it.  We recommend signing up for the patient portal called "MyChart".  Sign up information is provided on this After Visit Summary.  MyChart is used to connect with patients for Virtual Visits (Telemedicine).  Patients are able to view lab/test results, encounter notes, upcoming appointments, etc.  Non-urgent messages can be sent to your provider as well.   To learn more about what you can do with MyChart, go to [email or web address].    Your next appointment:   12 month(s)  The format for your next appointment:   In Person  Provider:   Thomas Kelly, MD {    Important Information About Sugar       

## 2022-05-30 ENCOUNTER — Encounter: Payer: Self-pay | Admitting: Cardiovascular Disease

## 2022-06-01 ENCOUNTER — Other Ambulatory Visit (HOSPITAL_COMMUNITY): Payer: Self-pay | Admitting: Family Medicine

## 2022-06-01 DIAGNOSIS — Z1231 Encounter for screening mammogram for malignant neoplasm of breast: Secondary | ICD-10-CM

## 2022-06-03 ENCOUNTER — Ambulatory Visit: Payer: Medicare Other

## 2022-06-08 ENCOUNTER — Other Ambulatory Visit (HOSPITAL_COMMUNITY): Payer: Medicare Other

## 2022-06-11 ENCOUNTER — Ambulatory Visit (HOSPITAL_COMMUNITY)
Admission: RE | Admit: 2022-06-11 | Discharge: 2022-06-11 | Disposition: A | Discharge status: Home / Self Care | Payer: Medicare Other | Source: Ambulatory Visit | Attending: Nurse Practitioner | Admitting: Nurse Practitioner

## 2022-06-11 ENCOUNTER — Ambulatory Visit (HOSPITAL_COMMUNITY)
Admission: RE | Admit: 2022-06-11 | Discharge: 2022-06-11 | Disposition: A | Discharge status: Home / Self Care | Payer: Medicare Other | Source: Ambulatory Visit | Attending: Family Medicine | Admitting: Family Medicine

## 2022-06-11 DIAGNOSIS — Z1231 Encounter for screening mammogram for malignant neoplasm of breast: Secondary | ICD-10-CM | POA: Insufficient documentation

## 2022-06-11 DIAGNOSIS — Z780 Asymptomatic menopausal state: Secondary | ICD-10-CM | POA: Diagnosis present

## 2022-06-18 ENCOUNTER — Other Ambulatory Visit (HOSPITAL_COMMUNITY): Payer: Medicare Other

## 2022-06-24 ENCOUNTER — Ambulatory Visit: Payer: Medicare Other

## 2022-09-21 ENCOUNTER — Ambulatory Visit (HOSPITAL_COMMUNITY)
Admission: RE | Admit: 2022-09-21 | Discharge: 2022-09-21 | Disposition: A | Discharge status: Home / Self Care | Payer: PPO | Source: Ambulatory Visit | Attending: Nurse Practitioner | Admitting: Nurse Practitioner

## 2022-09-21 ENCOUNTER — Other Ambulatory Visit (HOSPITAL_COMMUNITY): Payer: Self-pay | Admitting: Nurse Practitioner

## 2022-09-21 DIAGNOSIS — M542 Cervicalgia: Secondary | ICD-10-CM | POA: Diagnosis not present

## 2022-09-23 ENCOUNTER — Other Ambulatory Visit (HOSPITAL_COMMUNITY): Payer: Self-pay | Admitting: Family Medicine

## 2022-09-23 DIAGNOSIS — M5412 Radiculopathy, cervical region: Secondary | ICD-10-CM

## 2022-09-23 DIAGNOSIS — M542 Cervicalgia: Secondary | ICD-10-CM

## 2022-10-29 ENCOUNTER — Ambulatory Visit (HOSPITAL_COMMUNITY): Payer: PPO

## 2022-10-29 ENCOUNTER — Encounter (HOSPITAL_COMMUNITY): Payer: Self-pay

## 2022-12-31 ENCOUNTER — Ambulatory Visit (HOSPITAL_COMMUNITY): Payer: 59

## 2023-03-18 ENCOUNTER — Telehealth: Payer: Self-pay | Admitting: Cardiovascular Disease

## 2023-03-18 NOTE — Telephone Encounter (Signed)
Patient is calling wanting to know if she is due to have a Carotid study repeated this year.  Please advise.

## 2023-03-23 NOTE — Telephone Encounter (Signed)
I last saw the patient in 2023.Her last carotid study in 2021 showed only very mild plaque. Will determine at office visit if follow-up study is necessary

## 2023-03-23 NOTE — Telephone Encounter (Signed)
  Lennette Bihari, MD  Physician Specialty: Cardiology   Telephone Encounter Signed   Creation Time: 03/23/2023 10:32 AM   Signed     I last saw the patient in 2023.Her last carotid study in 2021 showed only very mild plaque. Will determine at office visit if follow-up study is necessary         Left the information above over voicemail. Left call back number if the pt has any questions

## 2023-04-26 LAB — LAB REPORT - SCANNED: EGFR: 50

## 2023-05-10 ENCOUNTER — Other Ambulatory Visit (HOSPITAL_COMMUNITY): Payer: Self-pay | Admitting: Nurse Practitioner

## 2023-05-10 DIAGNOSIS — Z1231 Encounter for screening mammogram for malignant neoplasm of breast: Secondary | ICD-10-CM

## 2023-06-17 ENCOUNTER — Ambulatory Visit (HOSPITAL_COMMUNITY)
Admission: RE | Admit: 2023-06-17 | Discharge: 2023-06-17 | Disposition: A | Discharge status: Home / Self Care | Payer: PPO | Source: Ambulatory Visit | Attending: Nurse Practitioner | Admitting: Nurse Practitioner

## 2023-06-17 DIAGNOSIS — Z1231 Encounter for screening mammogram for malignant neoplasm of breast: Secondary | ICD-10-CM | POA: Diagnosis present

## 2023-07-23 ENCOUNTER — Telehealth: Payer: Self-pay | Admitting: Internal Medicine

## 2023-07-23 NOTE — Telephone Encounter (Signed)
New patient approval

## 2023-07-27 NOTE — Telephone Encounter (Signed)
Called patient left voicemail to contact our office to schedule an new patient appointment

## 2023-07-29 ENCOUNTER — Ambulatory Visit: Payer: 59 | Admitting: Internal Medicine

## 2023-08-01 NOTE — Progress Notes (Unsigned)
Cardiology Office Note    Date:  08/07/2023   ID:  Sandra Henry, DOB 12/05/1945, MRN [id number]  PCP:  Billie Lade, MD  Cardiologist:  Nicki Guadalajara, MD   14 month follow-up cardiology evaluation   History of Present Illness:  Sandra Henry is a 77 y.o. female who presents for a follow-up cardiology evaluation.   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  She has a history of hypertension  She has a history of hyperlipidemia and had been taking Crestor 10 mg 3 times per week.  In October 2017, she developed symptoms of vertigo associated with nausea or vomiting and ultimately was evaluated by Dr. Lubertha Basque of ENT, as well as by Dr. Lesia Sago neurology.  She was diagnosed as having migraines headaches.  She was started on meclizine.  On November 30.  She was awakened from sleep at around 5 AM with palpitations which persisted.  She ultimately presented to the [facility] emergency room where she was found to have tachycardia.  Her ECG reports sinus tachycardia with PACs, but by my review, the EKG shows a at topical low atrial tachycardia, etiology rather than sinus tachycardia.  At that time, she was advised to stop losartan, which had been 25 mg and to initiate Toprol-XL 25 mg daily.  On this therapy, she does note improvement.  However, occasionally she had awakened in the very early morning and has noticed a jittery sensation in her heart rate.  She denied any episodes of chest pain.  She denies any awareness of sleep apnea.  According to her husband, however, she does snore mildly.  She typically gets up one night 1 time per night.  She denied significant nocturia.  She denied excessive daytime sleepiness.    When I initially saw her, I increased her Toprol-XL to 25 mg in the morning and evening.  An echo Doppler study, an EF of 55-60%.  There was grade 2 diastolic dysfunction.  There was  mild AR and showed evidence for mitral annular calcification with trivial MR.  She underwent laboratory.  Renal function was normal.  Left use were normal.  Lipid studies revealed a total cholesterol 172, triglycerides 161, HDL 39, increased VLDL at 32, and LDL at 101.   When I saw her in October 2018  she had continued to feel well with reference to palpitations and had not experienced any since initiating Toprol.  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  She had antegrade flow and normal waveforms in her vertebrals.  She had undergone laboratory by Dr. Sudie Bailey on 07/06/2017.  Total cholesterol was 152, triglycerides 110, HDL 46, and LDL 85.  She has continued to take rosuvastatin 10 mg.  She has an on Toprol-XL 37.5 mg in the morning and 25 mg in the evening.  She also is on losartan 50 mg twice a day, and has rarely taken HCTZ for leg swelling.    I saw her in October 2019 at which time she was remaining stable from a cardiac standpoint.  She had had issues with low back discomfort and has seen Dr. Trey Sailors on 3 occasions.  She has lumbar spondylosis with myelopathy and has been undergoing physical therapy.  There has been some improvement but if this does not significantly improve further she may  ultimately require fusion surgery.  On July 29, 2018 she underwent a year follow-up carotid Doppler study.  This remained stable and only showed very minimal plaque.  She had normal flow hemodynamics and subclavian as well as vertebral arteries.  She had laboratory on July 25, 2018 by Dr. Sudie Bailey.  Total cholesterol was 146, HDL 42.  Triglycerides 122.  LDL cholesterol 82.  In the past she had been on rosuvastatin at 20 mg, reduce this down to 10 mg and recently has been able to tolerate 15 mg daily.  She had developed some mild discomfort in the left calf region and apparently had Doppler study  done several weeks back which was negative for DVT.  She denied chest pain PND palpitations presyncope or syncope.  She was sleeping well.    I saw her November 2020 and since her prior evaluation she continued to be stable.   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  Recently, she has started to notice her blood pressure in the early morning being elevated normal blood pressure remaining portion of the day.  She has been on a regimen consisting of losartan 50 mg twice a day, metoprolol succinate 37.5 mg twice a day.  She has a prescription for HCTZ but has not taken any since she has not had any edema.  He also has a history of hyperlipidemia and was supposed to be on rosuvastatin 20 mg.  However over the past 6 months she has only been taking 15 mg daily.  Recent laboratory done in [place] had shown an LDL cholesterol at 81.  Cholesterol was 147 triglycerides 127.  She had normal LFTs and renal function.  Hemoglobin hematocrit were stable.  She has been working with her husband who is a Education officer, environmental in [employer].  During that evaluation I recommended a retrial of increased rosuvastatin of 20 mg  I saw her in August 2021.  Over the prior year she continued to do well.  She was evaluated by Dr. Sudie Bailey in July 2021 laboratory was obtained.  Total cholesterol was 161 LDL 94 triglycerides 118.  Fasting glucose was 90.  Renal function was stable with a creatinine of 0.76.  Hemoglobin hematocrit were stable at 14.4 and 45.6, she states her blood pressure typically has been running in the 140-150 range in the morning and oftentimes and stabilizes at 130 today.  She was taking amlodipine 2.5 mg in addition to her losartan 50 mg twice a day metoprolol succinate 37.5 mg twice a day, HCTZ 12.5 mg as needed and rosuvastatin 20 mg.    I saw her in June 06, 2021 at which time she remained stable and was continuing to work for Dr. Sudie Bailey  in [place]  She recently underwent colonoscopy with Dr. Karilyn Cota.  She admits to some occasional ankle swelling.  She had laboratory on May 23, 2021.  Total cholesterol 152, HDL 50, triglycerides 79, and LDL 85.  BUN was 26 creatinine 0.8.  TSH normal at 2.89.  Hemoglobin and hematocrit stable.  She brought with her blood pressure readings from home blood pressures typically have been stable in the 120s to 130s with a rare 140 systolic pressure.  During that evaluation, I suggested she can take hCG TZ 12.5 mg on an as-needed basis for her ankle edema.  With her 1/6 to 2/6 systolic murmur in the aortic region I recommended that she undergo a follow-up 2D echo  Doppler study for further evaluation and suspected most likely that this was due to aortic sclerosis.  She underwent her 2D echo Doppler study on July 03, 2021.  She was found to have hyperdynamic LV function with EF estimated 65 to 70% without wall motion abnormalities.  There was moderate mitral annular calcification with trivial MR.  Aortic valve was trileaflet and there was evidence for mild aortic sclerosis without stenosis.  There was mild tricuspid regurgitation.  I saw her on April 06, 2022.  Over the prior year both she and her husband have retired.  They have moved out of the pastoral housing and have bought a house and eating.  She admits that she has been under some increased stress particularly with reduction in income and their new house.  At times she has noted some occasional palpitations which typically are short-lived.  She denies any presyncope or syncope or chest pain.  At times she still notes occasional ankle swelling.  She is no longer working for Dr. Sudie Bailey but has an appointment to see him as a patient in the upcoming future and laboratory will be checked.  During her evaluation, her EKG was stable with heart rate at 60 and blood pressure was normal.  I felt she was most likely having either PACs or PVCs that were  isolated and recommended that she can take an extra half of metoprolol on an as-needed basis.  I last saw her on May 25, 2022.  Since her prior evaluation she started a new job working at [employer] for approximately 4 weeks.  Initially, during training, she again had increased palpitations but she admits that she was under increased stress at that time.  Over the last several weeks, these essentially have resolved since her stress component has decreased and she is settled into her new job where she works 1 day/week.  She denies any chest pain or shortness of breath.  She is now living in [place].   Since I last saw her she has continued to do well.  Dr. Gareth Morgan has just retired and prior to his retirement she did obtain follow-up laboratory in July which I do not have current results.  She denies any chest pain.  She admits to some mild shortness of breath when rushing and seems to be anxiety mediated.  She denies any exertional discomfort.  She is on amlodipine 5 mg, HCTZ at a reduced dose of 12.5 mg, and a reduced dose of losartan at 25 mg.  She apparently has been taking rosuvastatin now 15 mg/day instead of 20.  I do not have the results of her most recent laboratory.  Of note, in March 2023 creatinine was elevated at 1.55.  She presents for follow-up evaluation.   Past Medical History:  Diagnosis Date   Dental bridge present    upper   Dental crowns present    Dizziness and giddiness    Glaucoma    Hypercalcemia    Hypertension    under control with med., has been on med. x 4 yr.   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   index, middle and ring fingers    Past Surgical History:  Procedure Laterality Date   BREAST BIOPSY Left    CATARACT EXTRACTION W/ INTRAOCULAR LENS IMPLANT Bilateral    COLONOSCOPY N/A 01/30/2021   Procedure: COLONOSCOPY;  Surgeon: Malissa Hippo, MD;  [facility];  Service: Endoscopy;  Laterality: N/A;  am   TRIGGER FINGER RELEASE Right 02/20/2014   Procedure: RELEASE A-1 PULLEY RIGHT INDEX,  MIDDLE AND RING FINGERS ;  Surgeon: Nicki Reaper, MD;  Location: [facility];  Service: Orthopedics;  Laterality: Right;   TUBAL LIGATION     laparoscopic    Current Medications: Outpatient Medications Prior to Visit  Medication Sig Dispense Refill   Ascorbic Acid (VITAMIN C) 1000 MG tablet Take 1,000 mg by mouth daily.     aspirin 81 MG tablet Take 81 mg by mouth daily.     Calcium-Magnesium-Vitamin D 600-40-500 MG-MG-UNIT TB24 Take 1 tablet by mouth 2 (two) times daily.     cholecalciferol (VITAMIN D) 400 UNITS TABS tablet Take 400 Units by mouth 2 (two) times daily.     Garlic 10 MG CAPS Take 10 mg by mouth daily.     Glucosamine-Chondroitin 500-400 MG CAPS Take 2 tablets by mouth daily.     hydrochlorothiazide (HYDRODIURIL) 25 MG tablet Take 25 mg by mouth daily.     LORazepam (ATIVAN) 1 MG tablet Take 0.5 mg by mouth 2 (two) times daily.     losartan (COZAAR) 50 MG tablet Take 50 mg by mouth 2 (two) times daily.  3   metoprolol succinate (TOPROL-XL) 25 MG 24 hr tablet Take 37.5 mg by mouth 2 (two) times daily.      Multiple Vitamin (MULTIVITAMIN) tablet Take 1 tablet by mouth daily.     Netarsudil-Latanoprost (ROCKLATAN) 0.02-0.005 % SOLN Place 1 drop into both eyes at bedtime.     Omega-3 Fatty Acids (FISH OIL) 1000 MG CAPS Take 1,000 mg by mouth daily.     rosuvastatin (CRESTOR) 10 MG tablet Take 10 mg by mouth 2 (two) times daily.     timolol (TIMOPTIC) 0.5 % ophthalmic solution Place 1 drop into both eyes every morning.     amLODipine (NORVASC) 5 MG tablet Take 1 tablet (5 mg total) by mouth daily. (Patient taking differently: Take 5 mg by mouth at bedtime.) 90 tablet 3   rosuvastatin (CRESTOR) 20 MG tablet Take 1 tablet (20 mg total) by mouth daily after breakfast. (Patient not taking: Reported on 08/02/2023) 90 tablet 3   No  facility-administered medications prior to visit.     Allergies:   Codeine, Penicillins, and Sulfa antibiotics   Social History   Socioeconomic History   Marital status: Married    Spouse name: Not on file   Number of children: Not on file   Years of education: Not on file   Highest education level: Not on file  Occupational History   Occupation: medical receptionist    Employer: [employer]  Tobacco Use   Smoking status: Never   Smokeless tobacco: Never  Vaping Use   Vaping status: Never Used  Substance and Sexual Activity   Alcohol use: No   Drug use: No   Sexual activity: Not on file  Other Topics Concern   Not on file  Social History Narrative   Lives at home w/ her husband   Left-handed   Caffeine: occasional soft drink   Social Determinants of Health   Financial Resource Strain: Not on file  Food Insecurity: Not on file  Transportation Needs: Not on file  Physical Activity: Not on file  Stress: Not on file  Social Connections: Unknown (04/30/2023)   Received from [facility]   Social Network    Social Network: Not on file  Family History:  The patient's family history includes Heart attack in her brother; Migraines in her mother; Stroke in her father, mother, and sister.   ROS General: Negative; No fevers, chills, or night sweats;  HEENT: Negative; No changes in vision or hearing, sinus congestion, difficulty swallowing Pulmonary: Negative; No cough, wheezing, shortness of breath, hemoptysis Cardiovascular: See HPI GI: Negative; No nausea, vomiting, diarrhea, or abdominal pain GU: Negative; No dysuria, hematuria, or difficulty voiding Musculoskeletal: Lumbar disc disease with myelopathy Hematologic/Oncology: Negative; no easy bruising, bleeding Endocrine: Negative; no heat/cold intolerance; no diabetes Neuro: positive for recent diagnosis of migraine headaches and vertigo Skin: Negative; No rashes or skin lesions Psychiatric: Negative; No  behavioral problems, depression Sleep: Negative; No snoring, daytime sleepiness, hypersomnolence, bruxism, restless legs, hypnogognic hallucinations, no cataplexy Other comprehensive 14 point system review is negative.   PHYSICAL EXAM:   VS:  BP (!) 102/56 (BP Location: Left Arm, Patient Position: Sitting, Cuff Size: Normal)   Pulse 65   Ht 5\' 4"  (1.626 m)   Wt 125 lb (56.7 kg)   SpO2 96%   BMI 21.46 kg/m     Repeat blood pressure by me was 126/66  Wt Readings from Last 3 Encounters:  08/02/23 125 lb (56.7 kg)  05/25/22 126 lb (57.2 kg)  04/06/22 125 lb 3.2 oz (56.8 kg)    General: Alert, oriented, no distress.  Skin: normal turgor, no rashes, warm and dry HEENT: Normocephalic, atraumatic. Pupils equal round and reactive to light; sclera anicteric; extraocular muscles intact;  Nose without nasal septal hypertrophy Mouth/Parynx benign; Mallinpatti scale 3 Neck: No JVD, no carotid bruits; normal carotid upstroke Lungs: clear to ausculatation and percussion; no wheezing or rales Chest wall: without tenderness to palpitation Heart: PMI not displaced, RRR, s1 s2 normal, 1-2/6 systolic murmur, no diastolic murmur, no rubs, gallops, thrills, or heaves Abdomen: soft, nontender; no hepatosplenomehaly, BS+; abdominal aorta nontender and not dilated by palpation. Back: no CVA tenderness Pulses 2+ Musculoskeletal: full range of motion, normal strength, no joint deformities Extremities: no clubbing cyanosis or edema, Homan's sign negative  Neurologic: grossly nonfocal; Cranial nerves grossly wnl Psychologic: Normal mood and affect   Studies/Labs Reviewed:   May 25, 2022 ECG (independently read by me): NSR at 61, NSST changes  April 06, 2022 ECG (independently read by me): Sinus rhythm at 60 bpm.  No ectopy.  Normal intervals.  June 06, 2021 ECG (independently read by me): NSR at 64; no ST changes.  No ectopy.  Normal intervals.  August 2021 ECG (independently read by me): Normal  sinus rhythm at 65 bpm.  Nonspecific ST changes.  No ectopy.  Normal intervals.  November 2020 ECG (independently read by me): NSR at 63; normal intervals, no ectopy  October 2019 ECG (independently read by me): Normal sinus rhythm at 72 bpm.  No ectopy.  Normal intervals.  October 2018 ECG (independently read by me): Normal sinus rhythm at 63 bpm.  No ST segment changes.  Normal intervals.  February 2018 ECG (independently read by me): Normal sinus rhythm at 75 bpm.  Normal intervals.  Nonspecific ST changes.  December 2017 ECG (independently read by me): Normal sinus rhythm at 77 bpm.  PR interval 174 ms, QTc interval 432 ms.  I personally reviewed the ECG from 09/10/2016, and in contrast to the report which reads sinus tachycardia with PACs, the patient is in a low atrial ectopic tachycardic rhythm and then developed sinus rhythm after a pause followed by recurrent low atrial rhythm.  There  are PACs.  Recent Labs:    Latest Ref Rng & Units 12/19/2021    2:47 PM 10/13/2016    8:52 AM 09/10/2016    6:19 AM  BMP  Glucose 70 - 99 mg/dL 161  80  93   BUN 8 - 23 mg/dL 37  18  28   Creatinine 0.44 - 1.00 mg/dL 0.96  0.45  4.09   Sodium 135 - 145 mmol/L 142  140  141   Potassium 3.5 - 5.1 mmol/L 3.6  4.1  4.0   Chloride 98 - 111 mmol/L 104  107  109   CO2 22 - 32 mmol/L 27  26  25    Calcium 8.9 - 10.3 mg/dL 9.6  9.6  81.1         Latest Ref Rng & Units 12/19/2021    2:47 PM 10/13/2016    8:52 AM 09/10/2016    6:19 AM  Hepatic Function  Total Protein 6.5 - 8.1 g/dL 8.3  7.3  6.9   Albumin 3.5 - 5.0 g/dL 4.1  4.1  3.6   AST 15 - 41 U/L 16  18  20    ALT 0 - 44 U/L 16  17  20    Alk Phosphatase 38 - 126 U/L 51  46  54   Total Bilirubin 0.3 - 1.2 mg/dL 0.6  0.4  0.2        Latest Ref Rng & Units 12/19/2021    2:47 PM 09/10/2016    6:19 AM 09/07/2016   11:21 AM  CBC  WBC 4.0 - 10.5 K/uL 7.6  7.3  6.0   Hemoglobin 12.0 - 15.0 g/dL 91.4  78.2  95.6   Hematocrit 36.0 - 46.0 % 40.9   41.0  39.2   Platelets 150 - 400 K/uL 321  330  332    Lab Results  Component Value Date   MCV 95.8 12/19/2021   MCV 93.4 09/10/2016   MCV 94.0 09/07/2016   Lab Results  Component Value Date   TSH 3.661 09/10/2016   No results found for: "HGBA1C"   BNP No results found for: "BNP"  ProBNP No results found for: "PROBNP"   Lipid Panel     Component Value Date/Time   CHOL 172 10/13/2016 0852   TRIG 161 (H) 10/13/2016 0852   HDL 39 (L) 10/13/2016 0852   CHOLHDL 4.4 10/13/2016 0852   VLDL 32 (H) 10/13/2016 0852   LDLCALC 101 (H) 10/13/2016 2130     Lipid study obtained in Dr. Michelle Nasuti office on 03/26/2016:  Total cholesterol 186, HDL 48, triglycerides 107, LDL 117.  RADIOLOGY: No results found.    Additional studies/ records that were reviewed today include:  I reviewed the patient's emergency room evaluations.  I have also reviewed Dr. Anne Hahn is neurologic evaluation and Dr. Michelle Nasuti notes.  I reviewed her recent laboratory from 07/06/2017.  I reviewed the records of Dr. Trey Sailors.  I reviewed recent laboratory and her carotid studies.  I reviewed records from her evaluation by Jayme Cloud, NP at Dr. Michelle Nasuti office.  ASSESSMENT:    1. Primary hypertension   2. Heart palpitations   3. Hyperlipidemia with target LDL less than 70   4. Stage 3b chronic kidney disease (HCC)   5. Grade II diastolic dysfunction   6. Aortic valve sclerosis     PLAN:  Ms. Machell Hallett is a 77 year-old female who has a history of hypertension, hyperlipidemia, and had developed episodes of palpitations and  increased heart rate. When I initially saw her she had noticed her episodes at approximately 5 AM in the morning, which raised the possibility of obstructive sleep apnea contributing  to her symptomatology.  Her ECG from her emergency room evaluation  suggested ectopic atrial tachycardia rather than sinus tachycardia.  Symptoms significantly improved with initiation of  Toprol-XL which was titrated up to Toprol to 37.5 mg twice a day.  On an echo study in December 2017 she was found to have normal systolic function with grade 2 diastolic dysfunction.  When seen in August 2022 she was maintaining normal sinus rhythm and was doing well without palpitations on metoprolol succinate 37.5 mg twice a day, amlodipine 5 mg and losartan 50 mg twice a day.  At that time she had some issues with ankle swelling and HCTZ 12.5 mg was recommended to take on an as-needed basis.  Due to her systolic murmur she underwent a follow-up echo Doppler study on July 03, 2021 which showed hyperdynamic LV function, mitral annular calcification with trivial MR, aortic valve sclerosis without stenosis, and mild TR. I last saw her in June 2023 and over the prior year both she and her husband retired.  Her husband was a Education officer, environmental and had lived in the home supplied by the church.  They subsequently have bought a home.  This has resulted in some increased stress with palpitations.  In addition last year she was experiencing increased stress after starting a job at [employer].  When I saw her in June I suggested that she can take an extra metoprolol on an as-needed basis.  Presently, she is living in [place].  Her medications now include amlodipine 5 mg, HCTZ 12.5 mg, losartan now only at 25 mg daily, and she takes rosuvastatin 15 mg/day.  Will try to obtain the most recent laboratory results.  Clinically she is stable.  Cardiovascularly, she is stable and blood pressure today by me was 122/66.  I discussed with her my plans for future retirement next year.  I have recommended cardiology follow-up in  6 months to 1 year or sooner if problems arise.  I will set her up to transition to see Dr. Luane School in our [place] or [place] office for follow-up Cardiologic evaluation    Medication Adjustments/Labs and Tests Ordered: Current medicines are reviewed at length with the patient today.  Concerns  regarding medicines are outlined above.  Medication changes, Labs and Tests ordered today are listed in the Patient Instructions below.  Patient Instructions  Medication Instructions:  No medication changes *If you need a refill on your cardiac medications before your next appointment, please call your pharmacy*   Lab Work: None If you have labs (blood work) drawn today and your tests are completely normal, you will receive your results only by: MyChart Message (if you have MyChart) OR A paper copy in the mail If you have any lab test that is abnormal or we need to change your treatment, we will call you to review the results.   Testing/Procedures: None   Follow-Up: At [facility], you and your health needs are our priority.  As part of our continuing mission to provide you with exceptional heart care, we have created designated Provider Care Teams.  These Care Teams include your primary Cardiologist (physician) and Advanced Practice Providers (APPs -  Physician Assistants and Nurse Practitioners) who all work together to provide you with the care you need, when you need it.  We recommend signing up  for the patient portal called "MyChart".  Sign up information is provided on this After Visit Summary.  MyChart is used to connect with patients for Virtual Visits (Telemedicine).  Patients are able to view lab/test results, encounter notes, upcoming appointments, etc.  Non-urgent messages can be sent to your provider as well.   To learn more about what you can do with MyChart, go to [email or web address].    Your next appointment:   6 month(s)  Provider:   Dr. Jenene Slicker   He  Signed, Nicki Guadalajara, MD  08/07/2023 9:44 AM    [facility] [address], [address], [place], [place]  [address] Phone: [phone]

## 2023-08-02 ENCOUNTER — Ambulatory Visit: Payer: PPO | Attending: Cardiovascular Disease | Admitting: Cardiovascular Disease

## 2023-08-02 ENCOUNTER — Telehealth: Payer: Self-pay

## 2023-08-02 ENCOUNTER — Encounter: Payer: Self-pay | Admitting: Cardiovascular Disease

## 2023-08-02 VITALS — BP 102/56 | HR 65 | Ht 64.0 in | Wt 125.0 lb

## 2023-08-02 DIAGNOSIS — I10 Essential (primary) hypertension: Secondary | ICD-10-CM

## 2023-08-02 DIAGNOSIS — R002 Palpitations: Secondary | ICD-10-CM | POA: Diagnosis not present

## 2023-08-02 DIAGNOSIS — N1832 Chronic kidney disease, stage 3b: Secondary | ICD-10-CM | POA: Diagnosis not present

## 2023-08-02 DIAGNOSIS — Z136 Encounter for screening for cardiovascular disorders: Secondary | ICD-10-CM | POA: Diagnosis not present

## 2023-08-02 DIAGNOSIS — E785 Hyperlipidemia, unspecified: Secondary | ICD-10-CM

## 2023-08-02 DIAGNOSIS — I5189 Other ill-defined heart diseases: Secondary | ICD-10-CM

## 2023-08-02 DIAGNOSIS — I358 Other nonrheumatic aortic valve disorders: Secondary | ICD-10-CM

## 2023-08-02 NOTE — Patient Instructions (Signed)
Medication Instructions:  No medication changes *If you need a refill on your cardiac medications before your next appointment, please call your pharmacy*   Lab Work: None If you have labs (blood work) drawn today and your tests are completely normal, you will receive your results only by: MyChart Message (if you have MyChart) OR A paper copy in the mail If you have any lab test that is abnormal or we need to change your treatment, we will call you to review the results.   Testing/Procedures: None   Follow-Up: At [facility], you and your health needs are our priority.  As part of our continuing mission to provide you with exceptional heart care, we have created designated Provider Care Teams.  These Care Teams include your primary Cardiologist (physician) and Advanced Practice Providers (APPs -  Physician Assistants and Nurse Practitioners) who all work together to provide you with the care you need, when you need it.  We recommend signing up for the patient portal called "MyChart".  Sign up information is provided on this After Visit Summary.  MyChart is used to connect with patients for Virtual Visits (Telemedicine).  Patients are able to view lab/test results, encounter notes, upcoming appointments, etc.  Non-urgent messages can be sent to your provider as well.   To learn more about what you can do with MyChart, go to [email or web address].    Your next appointment:   6 month(s)  Provider:   Dr. Jenene Slicker

## 2023-08-07 ENCOUNTER — Encounter: Payer: Self-pay | Admitting: Cardiovascular Disease

## 2023-09-08 ENCOUNTER — Encounter: Payer: Self-pay | Admitting: Internal Medicine

## 2023-09-08 ENCOUNTER — Ambulatory Visit (INDEPENDENT_AMBULATORY_CARE_PROVIDER_SITE_OTHER): Payer: PPO | Admitting: Internal Medicine

## 2023-09-08 VITALS — BP 133/61 | HR 77 | Ht 64.0 in | Wt 128.4 lb

## 2023-09-08 DIAGNOSIS — N1832 Chronic kidney disease, stage 3b: Secondary | ICD-10-CM | POA: Diagnosis not present

## 2023-09-08 DIAGNOSIS — I10 Essential (primary) hypertension: Secondary | ICD-10-CM

## 2023-09-08 DIAGNOSIS — F411 Generalized anxiety disorder: Secondary | ICD-10-CM | POA: Insufficient documentation

## 2023-09-08 DIAGNOSIS — R002 Palpitations: Secondary | ICD-10-CM | POA: Insufficient documentation

## 2023-09-08 DIAGNOSIS — Z79899 Other long term (current) drug therapy: Secondary | ICD-10-CM | POA: Diagnosis not present

## 2023-09-08 DIAGNOSIS — H401133 Primary open-angle glaucoma, bilateral, severe stage: Secondary | ICD-10-CM

## 2023-09-08 NOTE — Assessment & Plan Note (Signed)
Symptoms are adequately controlled with Toprol-XL.

## 2023-09-08 NOTE — Patient Instructions (Signed)
It was a pleasure to see you today.  Thank you for giving [place] the opportunity to be involved in your care.  Below is a brief recap of your visit and next steps.  We will plan to see you again in 3 months.  Summary You have established care today No medication changes were made Repeat BMP ordered Controlled substance agreement and urine drug screen pending Follow up in 3 months

## 2023-09-08 NOTE — Assessment & Plan Note (Signed)
Anxiety is adequately controlled with lorazepam 0.5 mg twice daily as needed for anxiety relief.  PDMP reviewed and is appropriate. -Controlled substance agreement and UDS are pending

## 2023-09-08 NOTE — Assessment & Plan Note (Signed)
GFR 31 on labs from March 2023.  She is currently prescribed losartan 50 mg daily.  Her acute concern today is her renal function.  Repeat BMP ordered.

## 2023-09-08 NOTE — Assessment & Plan Note (Signed)
She is currently prescribed losartan 50 mg daily, amlodipine 5 mg daily, HCTZ 25 mg daily, and Toprol-XL 37.5 mg twice daily.  No medication changes are indicated today.

## 2023-09-08 NOTE — Progress Notes (Signed)
New Patient Office Visit  Subjective    Patient ID: Sandra Henry, female    DOB: 12/12/1945  Age: 77 y.o. MRN: [id number]  CC:  Chief Complaint  Patient presents with   Establish Care    HPI DEBAR ACHA presents to establish care.  She is a 77 year old woman who endorses a past medical history significant for HTN, HLD, anxiety, bilateral open-angle glaucoma, CKD stage IIIb, and heart palpitations.  Previously followed by Dr. Sudie Bailey. Sandra Henry reports feeling well today.  She is asymptomatic currently.  Her acute concern is monitoring her renal function.  Labs were last updated in July by her previous PCP.  She is a retired Engineer, site and denies tobacco, alcohol, and illicit drug use.  Her family medical history is significant for CVA and CAD.  Acute concerns, chronic medical conditions, and outstanding preventative care items discussed today are individually addressed in A/P below.   Outpatient Encounter Medications as of 09/08/2023  Medication Sig   Ascorbic Acid (VITAMIN C) 1000 MG tablet Take 1,000 mg by mouth daily.   aspirin 81 MG tablet Take 81 mg by mouth daily.   Calcium-Magnesium-Vitamin D 600-40-500 MG-MG-UNIT TB24 Take 1 tablet by mouth 2 (two) times daily.   cholecalciferol (VITAMIN D) 400 UNITS TABS tablet Take 400 Units by mouth 2 (two) times daily.   estradiol (ESTRACE) 0.1 MG/GM vaginal cream Place vaginally.   Garlic 10 MG CAPS Take 10 mg by mouth daily.   Glucosamine-Chondroitin 500-400 MG CAPS Take 2 tablets by mouth daily.   hydrochlorothiazide (HYDRODIURIL) 25 MG tablet Take 25 mg by mouth daily.   LORazepam (ATIVAN) 1 MG tablet Take 0.5 mg by mouth 2 (two) times daily.   losartan (COZAAR) 50 MG tablet Take 50 mg by mouth 2 (two) times daily.   metoprolol succinate (TOPROL-XL) 25 MG 24 hr tablet Take 37.5 mg by mouth 2 (two) times daily.    Multiple Vitamin (MULTIVITAMIN) tablet Take 1 tablet by mouth daily.   Netarsudil-Latanoprost  (ROCKLATAN) 0.02-0.005 % SOLN Place 1 drop into both eyes at bedtime.   Omega-3 Fatty Acids (FISH OIL) 1000 MG CAPS Take 1,000 mg by mouth daily.   rosuvastatin (CRESTOR) 10 MG tablet Take 10 mg by mouth 2 (two) times daily.   timolol (TIMOPTIC) 0.5 % ophthalmic solution Place 1 drop into both eyes every morning.   amLODipine (NORVASC) 5 MG tablet Take 1 tablet (5 mg total) by mouth daily. (Patient taking differently: Take 5 mg by mouth at bedtime.)   [DISCONTINUED] rosuvastatin (CRESTOR) 20 MG tablet Take 1 tablet (20 mg total) by mouth daily after breakfast. (Patient not taking: Reported on 08/02/2023)   No facility-administered encounter medications on file as of 09/08/2023.    Past Medical History:  Diagnosis Date   Dental bridge present    upper   Dental crowns present    Dizziness and giddiness    Glaucoma    Hypercalcemia    Hypertension    under control with med., has been on med. x 4 yr.   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   index, middle and ring fingers    Past Surgical History:  Procedure Laterality Date   BREAST BIOPSY Left    CATARACT EXTRACTION W/ INTRAOCULAR LENS IMPLANT Bilateral    COLONOSCOPY N/A 01/30/2021   Procedure: COLONOSCOPY;  Surgeon: Malissa Hippo, MD;  Location: [facility];  Service: Endoscopy;  Laterality: N/A;  am   TRIGGER FINGER RELEASE Right 02/20/2014   Procedure: RELEASE A-1 PULLEY RIGHT INDEX,  MIDDLE AND RING FINGERS ;  Surgeon: Nicki Reaper, MD;  Location: [facility];  Service: Orthopedics;  Laterality: Right;   TUBAL LIGATION     laparoscopic    Family History  Problem Relation Age of Onset   Stroke Mother    Migraines Mother    Stroke Father    Heart attack Brother    Stroke Sister     Social History   Socioeconomic History   Marital status: Married    Spouse name: Not on file   Number of children: Not on file   Years of  education: Not on file   Highest education level: Not on file  Occupational History   Occupation: medical receptionist    Employer: [employer]  Tobacco Use   Smoking status: Never   Smokeless tobacco: Never  Vaping Use   Vaping status: Never Used  Substance and Sexual Activity   Alcohol use: No   Drug use: No   Sexual activity: Not on file  Other Topics Concern   Not on file  Social History Narrative   Lives at home w/ her husband   Left-handed   Caffeine: occasional soft drink   Social Determinants of Health   Financial Resource Strain: Not on file  Food Insecurity: Not on file  Transportation Needs: Not on file  Physical Activity: Not on file  Stress: Not on file  Social Connections: Unknown (04/30/2023)   Received from [facility]   Social Network    Social Network: Not on file  Intimate Partner Violence: Not At Risk (07/19/2023)   Received from [facility]   Humiliation, Afraid, Rape, and Kick questionnaire    Fear of Current or Ex-Partner: No    Emotionally Abused: No    Physically Abused: No    Sexually Abused: No   Review of Systems  Constitutional:  Negative for chills and fever.  HENT:  Negative for sore throat.   Respiratory:  Negative for cough and shortness of breath.   Cardiovascular:  Negative for chest pain, palpitations and leg swelling.  Gastrointestinal:  Negative for abdominal pain, blood in stool, constipation, diarrhea, nausea and vomiting.  Genitourinary:  Negative for dysuria and hematuria.  Musculoskeletal:  Negative for myalgias.  Skin:  Negative for itching and rash.  Neurological:  Negative for dizziness and headaches.  Psychiatric/Behavioral:  Negative for depression and suicidal ideas.    Objective    BP 133/61 (BP Location: Left Arm, Patient Position: Sitting, Cuff Size: Normal)   Pulse 77   Ht 5\' 4"  (1.626 m)   Wt 128 lb 6.4 oz (58.2 kg)   SpO2 95%   BMI 22.04 kg/m   Physical Exam Vitals reviewed.  Constitutional:       General: She is not in acute distress.    Appearance: Normal appearance. She is not toxic-appearing.  HENT:     Head: Normocephalic and atraumatic.     Right Ear: External ear normal.     Left Ear: External ear normal.     Nose: Nose normal. No congestion or rhinorrhea.     Mouth/Throat:     Mouth: Mucous membranes are moist.     Pharynx: Oropharynx is clear. No oropharyngeal exudate or posterior oropharyngeal erythema.  Eyes:     General: No scleral icterus.    Extraocular Movements: Extraocular movements intact.  Conjunctiva/sclera: Conjunctivae normal.     Pupils: Pupils are equal, round, and reactive to light.  Cardiovascular:     Rate and Rhythm: Normal rate and regular rhythm.     Pulses: Normal pulses.     Heart sounds: Murmur heard.     No friction rub. No gallop.  Pulmonary:     Effort: Pulmonary effort is normal.     Breath sounds: Normal breath sounds. No wheezing, rhonchi or rales.  Abdominal:     General: Abdomen is flat. Bowel sounds are normal. There is no distension.     Palpations: Abdomen is soft.     Tenderness: There is no abdominal tenderness.  Musculoskeletal:        General: No swelling. Normal range of motion.     Cervical back: Normal range of motion.     Right lower leg: No edema.     Left lower leg: No edema.  Lymphadenopathy:     Cervical: No cervical adenopathy.  Skin:    General: Skin is warm and dry.     Capillary Refill: Capillary refill takes less than 2 seconds.     Coloration: Skin is not jaundiced.  Neurological:     General: No focal deficit present.     Mental Status: She is alert and oriented to person, place, and time.  Psychiatric:        Mood and Affect: Mood normal.        Behavior: Behavior normal.    Assessment & Plan:   Problem List Items Addressed This Visit       Essential hypertension    She is currently prescribed losartan 50 mg daily, amlodipine 5 mg daily, HCTZ 25 mg daily, and Toprol-XL 37.5 mg twice daily.   No medication changes are indicated today.      CKD stage 3b, GFR 30-44 ml/min (HCC) - Primary    GFR 31 on labs from March 2023.  She is currently prescribed losartan 50 mg daily.  Her acute concern today is her renal function.  Repeat BMP ordered.      Heart palpitations    Symptoms are adequately controlled with Toprol-XL.      GAD (generalized anxiety disorder)    Anxiety is adequately controlled with lorazepam 0.5 mg twice daily as needed for anxiety relief.  PDMP reviewed and is appropriate. -Controlled substance agreement and UDS are pending      Primary open-angle glaucoma, bilateral, severe stage    Followed by ophthalmology (Dr. Lottie Dawson).  She is prescribed timolol and Rocklatan ophthalmic drops.       Return in about 3 months (around 12/09/2023).   Billie Lade, MD

## 2023-09-08 NOTE — Assessment & Plan Note (Signed)
Followed by ophthalmology (Dr. Lottie Dawson).  She is prescribed timolol and Rocklatan ophthalmic drops.

## 2023-09-09 LAB — BASIC METABOLIC PANEL
BUN/Creatinine Ratio: 30 — ABNORMAL HIGH (ref 12–28)
BUN: 32 mg/dL — ABNORMAL HIGH (ref 8–27)
CO2: 21 mmol/L (ref 20–29)
Calcium: 11.5 mg/dL — ABNORMAL HIGH (ref 8.7–10.3)
Chloride: 102 mmol/L (ref 96–106)
Creatinine, Ser: 1.08 mg/dL — ABNORMAL HIGH (ref 0.57–1.00)
Glucose: 112 mg/dL — ABNORMAL HIGH (ref 70–99)
Potassium: 4.1 mmol/L (ref 3.5–5.2)
Sodium: 141 mmol/L (ref 134–144)
eGFR: 53 mL/min/{1.73_m2} — ABNORMAL LOW (ref >59)

## 2023-09-10 ENCOUNTER — Other Ambulatory Visit: Payer: Self-pay | Admitting: Internal Medicine

## 2023-09-12 LAB — TOXASSURE SELECT 13 (MW), URINE

## 2023-09-16 ENCOUNTER — Telehealth: Payer: Self-pay | Admitting: Internal Medicine

## 2023-09-16 NOTE — Telephone Encounter (Signed)
Error

## 2023-09-22 LAB — CMP14+EGFR
ALT: 17 [IU]/L (ref 0–32)
AST: 10 [IU]/L (ref 0–40)
Albumin: 4.3 g/dL (ref 3.8–4.8)
Alkaline Phosphatase: 64 [IU]/L (ref 44–121)
BUN/Creatinine Ratio: 26 (ref 12–28)
BUN: 37 mg/dL — ABNORMAL HIGH (ref 8–27)
Bilirubin Total: 0.3 mg/dL (ref 0.0–1.2)
CO2: 19 mmol/L — ABNORMAL LOW (ref 20–29)
Calcium: 10.1 mg/dL (ref 8.7–10.3)
Chloride: 109 mmol/L — ABNORMAL HIGH (ref 96–106)
Creatinine, Ser: 1.43 mg/dL — ABNORMAL HIGH (ref 0.57–1.00)
Globulin, Total: 3.1 g/dL (ref 1.5–4.5)
Glucose: 69 mg/dL — ABNORMAL LOW (ref 70–99)
Potassium: 4.7 mmol/L (ref 3.5–5.2)
Sodium: 145 mmol/L — ABNORMAL HIGH (ref 134–144)
Total Protein: 7.4 g/dL (ref 6.0–8.5)
eGFR: 38 mL/min/{1.73_m2} — ABNORMAL LOW (ref >59)

## 2023-09-22 LAB — PTH, INTACT AND CALCIUM: PTH: 19 pg/mL (ref 15–65)

## 2023-09-30 ENCOUNTER — Ambulatory Visit: Payer: Self-pay | Admitting: Internal Medicine

## 2023-09-30 ENCOUNTER — Other Ambulatory Visit: Payer: Self-pay | Admitting: Internal Medicine

## 2023-09-30 DIAGNOSIS — N1832 Chronic kidney disease, stage 3b: Secondary | ICD-10-CM

## 2023-09-30 NOTE — Telephone Encounter (Signed)
Copied from CRM [id number]. Topic: Clinical - Lab/Test Results >> Sep 30, 2023  3:31 PM Gaetano Hawthorne wrote: Reason for CRM: Patient has some questions about PTH level.

## 2023-09-30 NOTE — Telephone Encounter (Signed)
Copied from CRM [id number]. Topic: General - Other >> Sep 03, 2023 11:30 AM Fonda Kinder J wrote: Reason for CRM: Patients wants to know if their was a way to fill out new patient forms before sch. Appointment. Patient also had other general questions and requested a call back from clinic    Chief Complaint: Lab Results  Additional Notes: Pt called regarding thelab results she saw in MyChart. Pt had questions regarding a note about CKD3b. RN to route message to clinical pool for follow-up      Reason for Disposition  Caller requesting lab results  (Exception: Routine or non-urgent lab result.)  Answer Assessment - Initial Assessment Questions 1. REASON FOR CALL or QUESTION: "What is your reason for calling today?" or "How can I best help you?" or "What question do you have that I can help answer?"     Patient received lab results in MyChart, has questions about CKD3B diagnosis  2. CALLER: Document the source of call. (e.g., laboratory, patient).     Patient, Sandra Henry  Protocols used: PCP Call - No Triage-A-AH

## 2023-10-01 NOTE — Telephone Encounter (Signed)
Copied from CRM [id number]. Topic: Referral - Question >> Oct 01, 2023  8:06 AM Herbert Seta B wrote: Reason for CRM: Patient calling because received Mychart notification to call clinic to go over labs and referral to Nephrology.

## 2023-10-01 NOTE — Telephone Encounter (Signed)
Patient notified

## 2023-10-01 NOTE — Telephone Encounter (Signed)
Pt is scheduled for next Thursday, 12/26 with nephrology, went over PTH levels

## 2023-10-14 ENCOUNTER — Other Ambulatory Visit (HOSPITAL_COMMUNITY): Payer: Self-pay | Admitting: Nephrology

## 2023-10-14 DIAGNOSIS — N1832 Chronic kidney disease, stage 3b: Secondary | ICD-10-CM

## 2023-10-21 ENCOUNTER — Ambulatory Visit (HOSPITAL_COMMUNITY)
Admission: RE | Admit: 2023-10-21 | Discharge: 2023-10-21 | Disposition: A | Discharge status: Home / Self Care | Payer: PPO | Source: Ambulatory Visit | Attending: Nephrology | Admitting: Nephrology

## 2023-10-21 DIAGNOSIS — N1832 Chronic kidney disease, stage 3b: Secondary | ICD-10-CM | POA: Diagnosis not present

## 2023-10-22 DIAGNOSIS — E870 Hyperosmolality and hypernatremia: Secondary | ICD-10-CM | POA: Diagnosis not present

## 2023-10-22 DIAGNOSIS — N1832 Chronic kidney disease, stage 3b: Secondary | ICD-10-CM | POA: Diagnosis not present

## 2023-10-22 DIAGNOSIS — I129 Hypertensive chronic kidney disease with stage 1 through stage 4 chronic kidney disease, or unspecified chronic kidney disease: Secondary | ICD-10-CM | POA: Diagnosis not present

## 2023-10-26 ENCOUNTER — Other Ambulatory Visit: Payer: Self-pay | Admitting: Internal Medicine

## 2023-10-26 ENCOUNTER — Telehealth: Payer: Self-pay

## 2023-10-26 NOTE — Telephone Encounter (Signed)
 Refill request sent to dr Durwin Nora

## 2023-10-26 NOTE — Telephone Encounter (Signed)
 Copied from CRM [id number]. Topic: Clinical - Medication Question >> Oct 26, 2023  3:12 PM Monisha R wrote: Reason for CRM: Lorazpam is a controlled substance and previous Dr.practice has been closed. But can't medication filled from [facility] unless he signs off on it Per Pharmacy.

## 2023-10-29 ENCOUNTER — Other Ambulatory Visit: Payer: Self-pay

## 2023-10-29 ENCOUNTER — Telehealth: Payer: Self-pay | Admitting: Internal Medicine

## 2023-10-29 MED ORDER — TIMOLOL MALEATE 0.5 % OP SOLN: 1.0000 [drp] | OPHTHALMIC | 0 refills | Status: AC

## 2023-10-29 MED ORDER — ROSUVASTATIN CALCIUM 10 MG PO TABS: 10.0000 mg | ORAL_TABLET | Freq: Two times a day (BID) | ORAL | 0 refills | Status: DC

## 2023-10-29 MED ORDER — METOPROLOL SUCCINATE ER 25 MG PO TB24: 37.5000 mg | ORAL_TABLET | Freq: Two times a day (BID) | ORAL | 0 refills | Status: DC

## 2023-10-29 MED ORDER — HYDROCHLOROTHIAZIDE 25 MG PO TABS: 25.0000 mg | ORAL_TABLET | Freq: Every day | ORAL | 0 refills | Status: DC

## 2023-10-29 MED ORDER — LOSARTAN POTASSIUM 50 MG PO TABS: 50.0000 mg | ORAL_TABLET | Freq: Two times a day (BID) | ORAL | 3 refills | Status: DC

## 2023-10-29 MED ORDER — AMLODIPINE BESYLATE 5 MG PO TABS: 5.0000 mg | ORAL_TABLET | Freq: Every day | ORAL | 3 refills | Status: DC

## 2023-10-29 NOTE — Telephone Encounter (Signed)
Refills sent to pharmacy. 

## 2023-10-29 NOTE — Telephone Encounter (Signed)
Patient called still waiting on provider to reach out to her about this medication

## 2023-10-29 NOTE — Telephone Encounter (Signed)
Spoke to patient , refills were sent to pharmacy

## 2023-10-29 NOTE — Telephone Encounter (Signed)
Patient called no longer  gets from Dr Sudie Bailey office any long he has retried and needs refills before March her next appointment  losartan (COZAAR) 25 MG tablet [[id number]]  bid  rosuvastatin (CRESTOR) 10 MG tablet [[id number]]   hydrochlorothiazide (HYDRODIURIL) 12.5 MG tablet  (1xday)   timolol (TIMOPTIC) 0.5 % ophthalmic solution [[id number]]   amLODipine (NORVASC) 10 MG tablet [[id number]] take  metoprolol succinate (TOPROL-XL) 25 MG 24 hr tablet  (takes 1 1/2 tablets a day)  [facility]

## 2023-11-18 NOTE — Telephone Encounter (Signed)
No action needed at this time. Closing encounter

## 2023-11-19 ENCOUNTER — Telehealth: Payer: Self-pay | Admitting: Internal Medicine

## 2023-11-19 NOTE — Telephone Encounter (Signed)
 Patient calling says she is feeling nauseated and weak- says it is unusual for her. Nothing open on schedule requesting a call back from the nurse in regards to recommendations. Please advise Thank you

## 2023-11-22 ENCOUNTER — Ambulatory Visit (INDEPENDENT_AMBULATORY_CARE_PROVIDER_SITE_OTHER): Payer: HMO | Admitting: Internal Medicine

## 2023-11-22 ENCOUNTER — Encounter: Payer: Self-pay | Admitting: Internal Medicine

## 2023-11-22 VITALS — BP 118/54 | HR 71 | Ht 64.0 in | Wt 124.6 lb

## 2023-11-22 DIAGNOSIS — N1832 Chronic kidney disease, stage 3b: Secondary | ICD-10-CM

## 2023-11-22 DIAGNOSIS — K2970 Gastritis, unspecified, without bleeding: Secondary | ICD-10-CM | POA: Insufficient documentation

## 2023-11-22 DIAGNOSIS — K2950 Unspecified chronic gastritis without bleeding: Secondary | ICD-10-CM | POA: Diagnosis not present

## 2023-11-22 DIAGNOSIS — I10 Essential (primary) hypertension: Secondary | ICD-10-CM | POA: Diagnosis not present

## 2023-11-22 MED ORDER — OMEPRAZOLE 20 MG PO CPDR
20.0000 mg | DELAYED_RELEASE_CAPSULE | Freq: Every day | ORAL | 2 refills | Status: DC
Start: 2023-11-22 — End: 2024-05-22

## 2023-11-22 MED ORDER — ONDANSETRON HCL 4 MG PO TABS: 4.0000 mg | ORAL_TABLET | Freq: Three times a day (TID) | ORAL | 0 refills | Status: DC | PRN

## 2023-11-22 NOTE — Patient Instructions (Addendum)
 Please start taking Losartan  25 mg once daily.  Please do not take Amlodipine  and hydrochlorothiazide  for now.  Please take Omeprazole  once daily for 2 weeks and then as needed for stomach discomfort.  Please take Zofran  as needed for nausea.

## 2023-11-22 NOTE — Telephone Encounter (Signed)
 Scheduled

## 2023-11-22 NOTE — Progress Notes (Signed)
 Acute Office Visit  Subjective:    Patient ID: Sandra Henry, female    DOB: 03/07/1946, 78 y.o.   MRN: [id number]  Chief Complaint  Patient presents with   Nausea    Pt reports sx of nausea states her bp is low.  Ongoing for 2 weeks.     HPI Patient is in today for complaint of nausea and epigastric discomfort for the last 2 weeks.  Nausea is persistent, without vomiting, but does not report any association with specific food intake.  Denies any recent change in medicine or food.  Denies diarrhea.  She takes OTC stool softener for constipation.  Denies melena or hematochezia.  She tried taking meclizine for nausea without much relief.  She also reports having low BP, around 110s/50s.  She is currently taking losartan  25 mg BID.  She also has amlodipine  5 mg QD and HCTZ 25 mg QD, but takes half of them as needed for elevated BP - has taken them once in the last 1 week. Of note, she takes metoprolol  37.5 mg BID for palpitations. Denies any headache, dizziness, chest pain, dyspnea or palpitations currently.    Past Medical History:  Diagnosis Date   Dental bridge present    upper   Dental crowns present    Dizziness and giddiness    Glaucoma    Hypercalcemia    Hypertension    under control with med., has been on med. x 4 yr.   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   index, middle and ring fingers    Past Surgical History:  Procedure Laterality Date   BREAST BIOPSY Left    CATARACT EXTRACTION W/ INTRAOCULAR LENS IMPLANT Bilateral    COLONOSCOPY N/A 01/30/2021   Procedure: COLONOSCOPY;  Surgeon: Ruby Corporal, MD;  Location: [facility];  Service: Endoscopy;  Laterality: N/A;  am   TRIGGER FINGER RELEASE Right 02/20/2014   Procedure: RELEASE A-1 PULLEY RIGHT INDEX,  MIDDLE AND RING FINGERS ;  Surgeon: Kemp Patter, MD;  Location: [facility];  Service: Orthopedics;  Laterality:  Right;   TUBAL LIGATION     laparoscopic    Family History  Problem Relation Age of Onset   Stroke Mother    Migraines Mother    Stroke Father    Heart attack Brother    Stroke Sister     Social History   Socioeconomic History   Marital status: Married    Spouse name: Not on file   Number of children: Not on file   Years of education: Not on file   Highest education level: Not on file  Occupational History   Occupation: medical receptionist    Employer: [employer]  Tobacco Use   Smoking status: Never   Smokeless tobacco: Never  Vaping Use   Vaping status: Never Used  Substance and Sexual Activity   Alcohol use: No   Drug use: No   Sexual activity: Not on file  Other Topics Concern   Not on file  Social History Narrative   Lives at home w/ her husband   Left-handed   Caffeine: occasional soft drink   Social Drivers of Corporate investment banker Strain: Not on file  Food Insecurity: Not on file  Transportation Needs: Not on file  Physical Activity: Not on file  Stress: Not on file  Social Connections: Unknown (04/30/2023)  [facility]   Social Network    Social Network: Not on file  Intimate Partner Violence: Not At Risk (07/19/2023)   Received from [facility]   Humiliation, Afraid, Rape, and Kick questionnaire    Fear of Current or Ex-Partner: No    Emotionally Abused: No    Physically Abused: No    Sexually Abused: No    Outpatient Medications Prior to Visit  Medication Sig Dispense Refill   amLODipine  (NORVASC ) 5 MG tablet Take 1 tablet (5 mg total) by mouth daily. 90 tablet 3   Ascorbic Acid (VITAMIN C) 1000 MG tablet Take 1,000 mg by mouth daily.     aspirin 81 MG tablet Take 81 mg by mouth daily.     Calcium -Magnesium-Vitamin D  600-40-500 MG-MG-UNIT TB24 Take 1 tablet by mouth 2 (two) times daily.     cholecalciferol (VITAMIN D ) 400 UNITS TABS tablet Take 400 Units by mouth 2 (two) times daily.     estradiol (ESTRACE) 0.1  MG/GM vaginal cream Place vaginally.     Garlic 10 MG CAPS Take 10 mg by mouth daily.     Glucosamine-Chondroitin 500-400 MG CAPS Take 2 tablets by mouth daily.     hydrochlorothiazide  (HYDRODIURIL ) 25 MG tablet Take 1 tablet (25 mg total) by mouth daily. 90 tablet 0   LORazepam  (ATIVAN ) 1 MG tablet Take 0.5 tablets (0.5 mg total) by mouth 2 (two) times daily as needed for anxiety. 30 tablet 0   losartan  (COZAAR ) 25 MG tablet Take 25 mg by mouth daily.     Meclizine HCl 25 MG CHEW Chew 25 mg by mouth.     metoprolol  succinate (TOPROL -XL) 25 MG 24 hr tablet Take 1.5 tablets (37.5 mg total) by mouth 2 (two) times daily. 90 tablet 0   Multiple Vitamin (MULTIVITAMIN) tablet Take 1 tablet by mouth daily.     Netarsudil-Latanoprost (ROCKLATAN) 0.02-0.005 % SOLN Place 1 drop into both eyes at bedtime.     Omega-3 Fatty Acids (FISH OIL) 1000 MG CAPS Take 1,000 mg by mouth daily.     rosuvastatin  (CRESTOR ) 10 MG tablet Take 1 tablet (10 mg total) by mouth 2 (two) times daily. 180 tablet 0   timolol  (TIMOPTIC ) 0.5 % ophthalmic solution Place 1 drop into both eyes every morning. 10 mL 0   UNABLE TO FIND Med Name: lemon-ginger honey stomach settle     losartan  (COZAAR ) 50 MG tablet Take 1 tablet (50 mg total) by mouth 2 (two) times daily. 60 tablet 3   No facility-administered medications prior to visit.    Allergies  Allergen Reactions   Codeine Nausea Only   Penicillins Rash    AS A CHILD   Sulfa Antibiotics Rash    Review of Systems  Constitutional:  Negative for chills and fever.  HENT:  Negative for congestion, sinus pressure, sinus pain and sore throat.   Eyes:  Negative for pain and discharge.  Respiratory:  Negative for cough and shortness of breath.   Cardiovascular:  Negative for chest pain and palpitations.  Gastrointestinal:  Positive for constipation and nausea. Negative for abdominal pain, diarrhea and vomiting.  Endocrine: Negative for polydipsia and polyuria.  Genitourinary:   Negative for dysuria and hematuria.  Musculoskeletal:  Negative for neck pain and neck stiffness.  Skin:  Negative for rash.  Neurological:  Negative for dizziness and weakness.  Psychiatric/Behavioral:  Negative for agitation and behavioral problems.        Objective:    Physical Exam  Vitals reviewed.  Constitutional:      General: She is not in acute distress.    Appearance: She is not diaphoretic.  HENT:     Head: Normocephalic and atraumatic.     Nose: Nose normal.     Mouth/Throat:     Mouth: Mucous membranes are moist.  Eyes:     General: No scleral icterus.    Extraocular Movements: Extraocular movements intact.  Cardiovascular:     Rate and Rhythm: Normal rate and regular rhythm.     Pulses: Normal pulses.     Heart sounds: No murmur heard. Pulmonary:     Breath sounds: Normal breath sounds. No wheezing or rales.  Abdominal:     Palpations: Abdomen is soft.     Tenderness: There is no abdominal tenderness.  Musculoskeletal:     Right lower leg: No edema.     Left lower leg: No edema.  Skin:    General: Skin is warm.     Findings: No rash.  Neurological:     General: No focal deficit present.     Mental Status: She is alert and oriented to person, place, and time.  Psychiatric:        Mood and Affect: Mood normal.        Behavior: Behavior normal.     BP (!) 118/54 (BP Location: Left Arm)   Pulse 71   Ht 5\' 4"  (1.626 m)   Wt 124 lb 9.6 oz (56.5 kg)   SpO2 99%   BMI 21.39 kg/m  Wt Readings from Last 3 Encounters:  11/22/23 124 lb 9.6 oz (56.5 kg)  09/08/23 128 lb 6.4 oz (58.2 kg)  08/02/23 125 lb (56.7 kg)        Assessment & Plan:   Problem List Items Addressed This Visit       Cardiovascular and Mediastinum   Essential hypertension - Primary   BP Readings from Last 1 Encounters:  11/22/23 (!) 118/54   Well-controlled with losartan  25 mg BID, but takes only half tablet mostly Advised to take losartan  25 mg QD instead of taking half  tablet twice daily Hold amlodipine  and HCTZ for now as she anyways takes half tablet on some days - can consider adding one of them if BP remains elevated Counseled for compliance with the medications Advised DASH diet and moderate exercise/walking as tolerated       Relevant Medications   losartan  (COZAAR ) 25 MG tablet     Digestive   Chronic gastritis without bleeding   Nausea and epigastric discomfort could be related to gastritis Started omeprazole  20 mg once daily Zofran  as needed for nausea Maintain adequate hydration Avoid hot and spicy food      Relevant Medications   omeprazole  (PRILOSEC) 20 MG capsule   ondansetron  (ZOFRAN ) 4 MG tablet     Genitourinary   CKD stage 3b, GFR 30-44 ml/min (HCC)   Last CMP showed decline in GFR Hold hydrochlorothiazide  Unclear if hypotensive spells are contributing to declining GFR -adjusted antihypertensive regimen today Advised to maintain adequate hydration        Meds ordered this encounter  Medications   omeprazole  (PRILOSEC) 20 MG capsule    Sig: Take 1 capsule (20 mg total) by mouth daily.    Dispense:  30 capsule    Refill:  2   ondansetron  (ZOFRAN ) 4 MG tablet    Sig: Take 1 tablet (4 mg total) by mouth every 8 (eight) hours as needed for nausea or  vomiting.    Dispense:  20 tablet    Refill:  0     Keath Matera Alyssa Backbone, MD

## 2023-11-22 NOTE — Assessment & Plan Note (Signed)
 BP Readings from Last 1 Encounters:  11/22/23 (!) 118/54   Well-controlled with losartan  25 mg BID, but takes only half tablet mostly Advised to take losartan  25 mg QD instead of taking half tablet twice daily Hold amlodipine  and HCTZ for now as she anyways takes half tablet on some days - can consider adding one of them if BP remains elevated Counseled for compliance with the medications Advised DASH diet and moderate exercise/walking as tolerated

## 2023-11-22 NOTE — Assessment & Plan Note (Signed)
 Nausea and epigastric discomfort could be related to gastritis Started omeprazole  20 mg once daily Zofran  as needed for nausea Maintain adequate hydration Avoid hot and spicy food

## 2023-11-22 NOTE — Assessment & Plan Note (Signed)
 Last CMP showed decline in GFR Hold hydrochlorothiazide  Unclear if hypotensive spells are contributing to declining GFR -adjusted antihypertensive regimen today Advised to maintain adequate hydration

## 2023-11-28 ENCOUNTER — Other Ambulatory Visit: Payer: Self-pay | Admitting: Internal Medicine

## 2023-12-13 ENCOUNTER — Telehealth: Payer: Self-pay | Admitting: Internal Medicine

## 2023-12-13 ENCOUNTER — Encounter: Payer: PPO | Admitting: Internal Medicine

## 2023-12-13 DIAGNOSIS — I10 Essential (primary) hypertension: Secondary | ICD-10-CM

## 2023-12-13 DIAGNOSIS — F411 Generalized anxiety disorder: Secondary | ICD-10-CM

## 2023-12-13 DIAGNOSIS — N1832 Chronic kidney disease, stage 3b: Secondary | ICD-10-CM
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
## 2023-12-14 DIAGNOSIS — M79675 Pain in left toe(s): Secondary | ICD-10-CM | POA: Diagnosis not present

## 2023-12-14 DIAGNOSIS — B351 Tinea unguium: Secondary | ICD-10-CM | POA: Diagnosis not present

## 2023-12-14 DIAGNOSIS — M79674 Pain in right toe(s): Secondary | ICD-10-CM | POA: Diagnosis not present

## 2023-12-14 NOTE — Telephone Encounter (Signed)
 Patient advised.

## 2023-12-20 ENCOUNTER — Other Ambulatory Visit: Payer: Self-pay | Admitting: Internal Medicine

## 2023-12-20 ENCOUNTER — Telehealth: Payer: Self-pay

## 2023-12-20 NOTE — Telephone Encounter (Signed)
 Copied from CRM [id number]. Topic: Clinical - Medication Refill >> Dec 20, 2023 10:21 AM Elle L wrote: Most Recent Primary Care Visit:  Provider: Anabel Halon  Department: [facility]  Visit Type: OFFICE VISIT  Date: 11/22/2023  Medication: LORazepam (ATIVAN) 1 MG tablet AND   Has the patient contacted their pharmacy? Yes  Is this the correct pharmacy for this prescription? Yes If no, delete pharmacy and type the correct one.  This is the patient's preferred pharmacy:   [facility] - [place], [place] - [address] [place] [address] Phone: [phone] Fax: [phone]   Has the prescription been filled recently? No  Is the patient out of the medication? Yes, a few days left.   Has the patient been seen for an appointment in the last year OR does the patient have an upcoming appointment? Yes, the patient's appointment was rescheduled   Can we respond through MyChart? No  Agent: Please be advised that Rx refills may take up to 3 business days. We ask that you follow-up with your pharmacy.

## 2023-12-20 NOTE — Telephone Encounter (Signed)
 Copied from CRM [id number]. Topic: Clinical - Prescription Issue >> Dec 20, 2023 10:27 AM Sandra Henry wrote: Reason for CRM: The patient is requesting her hydrochlorothiazide (HYDRODIURIL) 25 MG tablet prescription be changed to 12.5 MG once in the morning.

## 2023-12-21 ENCOUNTER — Other Ambulatory Visit: Payer: Self-pay

## 2023-12-21 MED ORDER — LORAZEPAM 1 MG PO TABS: 1.0000 mg | ORAL_TABLET | Freq: Every evening | ORAL | 0 refills | Status: DC | PRN

## 2023-12-21 MED ORDER — HYDROCHLOROTHIAZIDE 25 MG PO TABS: 12.5000 mg | ORAL_TABLET | Freq: Every day | ORAL | 0 refills | Status: DC

## 2023-12-21 NOTE — Telephone Encounter (Signed)
 Rx updated.

## 2024-01-03 DIAGNOSIS — R809 Proteinuria, unspecified: Secondary | ICD-10-CM | POA: Diagnosis not present

## 2024-01-03 DIAGNOSIS — F411 Generalized anxiety disorder: Secondary | ICD-10-CM | POA: Diagnosis not present

## 2024-01-03 DIAGNOSIS — N189 Chronic kidney disease, unspecified: Secondary | ICD-10-CM | POA: Diagnosis not present

## 2024-01-03 DIAGNOSIS — N1832 Chronic kidney disease, stage 3b: Secondary | ICD-10-CM | POA: Diagnosis not present

## 2024-01-03 DIAGNOSIS — I10 Essential (primary) hypertension: Secondary | ICD-10-CM | POA: Diagnosis not present

## 2024-01-03 DIAGNOSIS — D631 Anemia in chronic kidney disease: Secondary | ICD-10-CM | POA: Diagnosis not present

## 2024-01-04 ENCOUNTER — Encounter: Payer: Self-pay | Admitting: Internal Medicine

## 2024-01-04 LAB — CBC WITH DIFFERENTIAL/PLATELET
Basophils Absolute: 0.1 10*3/uL (ref 0.0–0.2)
Basos: 1 %
EOS (ABSOLUTE): 0.3 10*3/uL (ref 0.0–0.4)
Eos: 3 %
Hematocrit: 34.9 % (ref 34.0–46.6)
Hemoglobin: 11.4 g/dL (ref 11.1–15.9)
Immature Grans (Abs): 0 10*3/uL (ref 0.0–0.1)
Immature Granulocytes: 0 %
Lymphocytes Absolute: 1.4 10*3/uL (ref 0.7–3.1)
Lymphs: 17 %
MCH: 31.9 pg (ref 26.6–33.0)
MCHC: 32.7 g/dL (ref 31.5–35.7)
MCV: 98 fL — ABNORMAL HIGH (ref 79–97)
Monocytes Absolute: 0.6 10*3/uL (ref 0.1–0.9)
Monocytes: 8 %
Neutrophils Absolute: 5.8 10*3/uL (ref 1.4–7.0)
Neutrophils: 71 %
Platelets: 302 10*3/uL (ref 150–450)
RBC: 3.57 x10E6/uL — ABNORMAL LOW (ref 3.77–5.28)
RDW: 12.2 % (ref 11.7–15.4)
WBC: 8.2 10*3/uL (ref 3.4–10.8)

## 2024-01-04 LAB — CMP14+EGFR
ALT: 13 IU/L (ref 0–32)
AST: 8 IU/L (ref 0–40)
Albumin: 4.5 g/dL (ref 3.8–4.8)
Alkaline Phosphatase: 61 IU/L (ref 44–121)
BUN/Creatinine Ratio: 29 — ABNORMAL HIGH (ref 12–28)
BUN: 46 mg/dL — ABNORMAL HIGH (ref 8–27)
Bilirubin Total: 0.2 mg/dL (ref 0.0–1.2)
CO2: 17 mmol/L — ABNORMAL LOW (ref 20–29)
Calcium: 9.6 mg/dL (ref 8.7–10.3)
Chloride: 111 mmol/L — ABNORMAL HIGH (ref 96–106)
Creatinine, Ser: 1.56 mg/dL — ABNORMAL HIGH (ref 0.57–1.00)
Globulin, Total: 2.9 g/dL (ref 1.5–4.5)
Glucose: 74 mg/dL (ref 70–99)
Potassium: 3.7 mmol/L (ref 3.5–5.2)
Sodium: 144 mmol/L (ref 134–144)
Total Protein: 7.4 g/dL (ref 6.0–8.5)
eGFR: 34 mL/min/{1.73_m2} — ABNORMAL LOW (ref >59)

## 2024-01-04 LAB — TSH+FREE T4
Free T4: 0.99 ng/dL (ref 0.82–1.77)
TSH: 3.07 u[IU]/mL (ref 0.450–4.500)

## 2024-01-04 LAB — LIPID PANEL
Chol/HDL Ratio: 3.3 ratio (ref 0.0–4.4)
Cholesterol, Total: 140 mg/dL (ref 100–199)
HDL: 42 mg/dL (ref >39)
LDL Chol Calc (NIH): 79 mg/dL (ref 0–99)
Triglycerides: 103 mg/dL (ref 0–149)
VLDL Cholesterol Cal: 19 mg/dL (ref 5–40)

## 2024-01-04 LAB — HEMOGLOBIN A1C
Est. average glucose Bld gHb Est-mCnc: 128 mg/dL
Hgb A1c MFr Bld: 6.1 % — ABNORMAL HIGH (ref 4.8–5.6)

## 2024-01-04 LAB — VITAMIN D 25 HYDROXY (VIT D DEFICIENCY, FRACTURES): Vit D, 25-Hydroxy: 57.2 ng/mL (ref 30.0–100.0)

## 2024-01-04 LAB — B12 AND FOLATE PANEL
Folate: >20 ng/mL (ref >3.0)
Vitamin B-12: >2000 pg/mL — ABNORMAL HIGH (ref 232–1245)

## 2024-01-06 DIAGNOSIS — H401133 Primary open-angle glaucoma, bilateral, severe stage: Secondary | ICD-10-CM | POA: Diagnosis not present

## 2024-01-10 DIAGNOSIS — N1832 Chronic kidney disease, stage 3b: Secondary | ICD-10-CM | POA: Diagnosis not present

## 2024-01-10 DIAGNOSIS — I351 Nonrheumatic aortic (valve) insufficiency: Secondary | ICD-10-CM | POA: Diagnosis not present

## 2024-01-10 DIAGNOSIS — I129 Hypertensive chronic kidney disease with stage 1 through stage 4 chronic kidney disease, or unspecified chronic kidney disease: Secondary | ICD-10-CM | POA: Diagnosis not present

## 2024-01-10 DIAGNOSIS — I6529 Occlusion and stenosis of unspecified carotid artery: Secondary | ICD-10-CM | POA: Diagnosis not present

## 2024-01-24 ENCOUNTER — Other Ambulatory Visit: Payer: Self-pay | Admitting: Internal Medicine

## 2024-02-01 DIAGNOSIS — R319 Hematuria, unspecified: Secondary | ICD-10-CM | POA: Diagnosis not present

## 2024-02-01 DIAGNOSIS — D631 Anemia in chronic kidney disease: Secondary | ICD-10-CM | POA: Diagnosis not present

## 2024-02-01 DIAGNOSIS — I10 Essential (primary) hypertension: Secondary | ICD-10-CM | POA: Diagnosis not present

## 2024-02-01 DIAGNOSIS — N1832 Chronic kidney disease, stage 3b: Secondary | ICD-10-CM | POA: Diagnosis not present

## 2024-02-01 DIAGNOSIS — N189 Chronic kidney disease, unspecified: Secondary | ICD-10-CM | POA: Diagnosis not present

## 2024-02-01 DIAGNOSIS — R809 Proteinuria, unspecified: Secondary | ICD-10-CM | POA: Diagnosis not present

## 2024-02-01 DIAGNOSIS — E119 Type 2 diabetes mellitus without complications: Secondary | ICD-10-CM | POA: Diagnosis not present

## 2024-02-01 DIAGNOSIS — I129 Hypertensive chronic kidney disease with stage 1 through stage 4 chronic kidney disease, or unspecified chronic kidney disease: Secondary | ICD-10-CM | POA: Diagnosis not present

## 2024-02-07 ENCOUNTER — Ambulatory Visit (INDEPENDENT_AMBULATORY_CARE_PROVIDER_SITE_OTHER): Admitting: Internal Medicine

## 2024-02-07 ENCOUNTER — Encounter: Payer: Self-pay | Admitting: Internal Medicine

## 2024-02-07 VITALS — BP 104/64 | HR 63 | Ht 64.0 in | Wt 122.4 lb

## 2024-02-07 DIAGNOSIS — R7303 Prediabetes: Secondary | ICD-10-CM | POA: Diagnosis not present

## 2024-02-07 DIAGNOSIS — I10 Essential (primary) hypertension: Secondary | ICD-10-CM

## 2024-02-07 DIAGNOSIS — N1832 Chronic kidney disease, stage 3b: Secondary | ICD-10-CM | POA: Diagnosis not present

## 2024-02-07 DIAGNOSIS — Z0001 Encounter for general adult medical examination with abnormal findings: Secondary | ICD-10-CM

## 2024-02-07 DIAGNOSIS — Z01810 Encounter for preprocedural cardiovascular examination: Secondary | ICD-10-CM | POA: Insufficient documentation

## 2024-02-07 DIAGNOSIS — F411 Generalized anxiety disorder: Secondary | ICD-10-CM | POA: Diagnosis not present

## 2024-02-07 NOTE — Assessment & Plan Note (Signed)
 Annual physical completed today.  Previous records and labs reviewed. -Labs from 3/24 reviewed -Recommend reducing losartan  to 25 mg daily as otherwise documented -Preventative care items are largely up-to-date -We will tentatively plan for follow-up in 3 months

## 2024-02-07 NOTE — Assessment & Plan Note (Signed)
 A1c 6.1 on labs from last month.  She will focus on dietary changes in an effort to lower her blood sugar.

## 2024-02-07 NOTE — Assessment & Plan Note (Signed)
 BP is borderline low in office today.  She checks her blood pressure daily at home and reports diastolic readings consistently ranging 40-50 mmHg.  She is currently taking losartan  25 mg twice daily and Toprol -XL 37.5 mg daily.  Amlodipine  has been discontinued.  She continues to take HCTZ as needed if she feels that her blood pressure is elevated.  We discussed that hypotension could contribute to symptoms of nausea. -Recommend reducing frequency of losartan  to 25 mg daily.  Continue Toprol -XL as currently prescribed.  Recommend discontinuing HCTZ.  Follow-up in 3 months for reassessment.

## 2024-02-07 NOTE — Patient Instructions (Signed)
 It was a pleasure to see you today.  Thank you for giving us  the opportunity to be involved in your care.  Below is a brief recap of your visit and next steps.  We will plan to see you again in 3 months.  Summary Annual physical today Reduce frequency of losartan  to 25 mg daily Continue additional medications as prescribed Follow up in 3 months

## 2024-02-07 NOTE — Progress Notes (Signed)
 Complete physical exam  Patient: Sandra Henry   DOB: 11/24/1945   78 y.o. Female  MRN: [id number]  Subjective:    Chief Complaint  Patient presents with   Annual Exam    Fatigued and lack of appetite     Sandra Henry is a 78 y.o. female who presents today for a complete physical exam. She reports consuming a general diet. Home exercise routine includes using an Ellipse machine. She generally feels fairly well. She reports sleeping well. She does have additional problems to discuss today. She endorses persistent nausea. NO changes in symptoms with adding PPI. She also notes that diastolic BP readings have consistently been low as of late.    Most recent fall risk assessment:    02/07/2024    9:11 AM  Fall Risk   Falls in the past year? 0  Number falls in past yr: 0  Injury with Fall? 0  Risk for fall due to : No Fall Risks  Follow up Falls evaluation completed   Most recent depression screenings:    02/07/2024    9:11 AM 11/22/2023    3:33 PM  PHQ 2/9 Scores  PHQ - 2 Score 0 0  PHQ- 9 Score 4 0   Vision:Within last year and Dental: Current dental problems and Receives regular dental care  Past Medical History:  Diagnosis Date   Dental bridge present    upper   Dental crowns present    Dizziness and giddiness    Glaucoma    Hypercalcemia    Hypertension    under control with med., has been on med. x 4 yr.   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   index, middle and ring fingers   Past Surgical History:  Procedure Laterality Date   BREAST BIOPSY Left    CATARACT EXTRACTION W/ INTRAOCULAR LENS IMPLANT Bilateral    COLONOSCOPY N/A 01/30/2021   Procedure: COLONOSCOPY;  Surgeon: Ruby Corporal, MD;  [facility];  Service: Endoscopy;  Laterality: N/A;  am   TRIGGER FINGER RELEASE Right 02/20/2014   Procedure: RELEASE A-1 PULLEY RIGHT INDEX,  MIDDLE AND RING FINGERS ;   Surgeon: Kemp Patter, MD;  Location: [facility];  Service: Orthopedics;  Laterality: Right;   TUBAL LIGATION     laparoscopic   Social History   Tobacco Use   Smoking status: Never   Smokeless tobacco: Never  Vaping Use   Vaping status: Never Used  Substance Use Topics   Alcohol use: No   Drug use: No   Family History  Problem Relation Age of Onset   Stroke Mother    Migraines Mother    Stroke Father    Heart attack Brother    Stroke Sister    Allergies  Allergen Reactions   Codeine Nausea Only   Penicillins Rash    AS A CHILD   Sulfa Antibiotics Rash   Patient Care Team: Tobi Fortes, MD as PCP - General (Internal Medicine) Millicent Ally, MD as PCP - Cardiology (Cardiology) Lenton Rail, MD as Consulting Physician (Otolaryngology)   Outpatient Medications Prior to Visit  Medication Sig   Ascorbic Acid (VITAMIN C) 1000 MG tablet Take 1,000 mg by mouth daily.   aspirin 81 MG tablet Take 81 mg by mouth daily.   Calcium -Magnesium-Vitamin D  600-40-500 MG-MG-UNIT TB24 Take 1 tablet by mouth 2 (two) times  daily.   cholecalciferol (VITAMIN D ) 400 UNITS TABS tablet Take 400 Units by mouth 2 (two) times daily.   estradiol (ESTRACE) 0.1 MG/GM vaginal cream Place vaginally.   Garlic 10 MG CAPS Take 10 mg by mouth daily.   Glucosamine-Chondroitin 500-400 MG CAPS Take 2 tablets by mouth daily.   LORazepam  (ATIVAN ) 1 MG tablet take 0.5 tablets (0.5 milligram total) by mouth 2 (two) times daily as needed for anxiety.   losartan  (COZAAR ) 25 MG tablet Take 25 mg by mouth daily.   Meclizine HCl 25 MG CHEW Chew 25 mg by mouth.   metoprolol  succinate (TOPROL -XL) 25 MG 24 hr tablet Take 1.5 tablets (37.5 mg total) by mouth 2 (two) times daily.   Multiple Vitamin (MULTIVITAMIN) tablet Take 1 tablet by mouth daily.   Netarsudil-Latanoprost (ROCKLATAN) 0.02-0.005 % SOLN Place 1 drop into both eyes at bedtime.   Omega-3 Fatty Acids (FISH OIL) 1000 MG CAPS Take 1,000  mg by mouth daily.   omeprazole  (PRILOSEC) 20 MG capsule Take 1 capsule (20 mg total) by mouth daily.   ondansetron  (ZOFRAN ) 4 MG tablet Take 1 tablet (4 mg total) by mouth every 8 (eight) hours as needed for nausea or vomiting.   rosuvastatin  (CRESTOR ) 10 MG tablet Take 1 tablet (10 mg total) by mouth 2 (two) times daily.   timolol  (TIMOPTIC ) 0.5 % ophthalmic solution Place 1 drop into both eyes every morning.   UNABLE TO FIND Med Name: lemon-ginger honey stomach settle   [DISCONTINUED] amLODipine  (NORVASC ) 5 MG tablet Take 1 tablet (5 mg total) by mouth daily.   [DISCONTINUED] hydrochlorothiazide  (HYDRODIURIL ) 25 MG tablet Take 0.5 tablets (12.5 mg total) by mouth daily.   No facility-administered medications prior to visit.   Review of Systems  Constitutional:  Negative for chills and fever.  HENT:  Negative for sore throat.   Respiratory:  Negative for cough and shortness of breath.   Cardiovascular:  Negative for chest pain, palpitations and leg swelling.  Gastrointestinal:  Positive for constipation (recurring issue) and nausea (persistent). Negative for abdominal pain, blood in stool, diarrhea and vomiting.  Genitourinary:  Negative for dysuria and hematuria.  Musculoskeletal:  Negative for myalgias.  Skin:  Negative for itching and rash.  Neurological:  Negative for dizziness and headaches.  Psychiatric/Behavioral:  Negative for depression and suicidal ideas.       Objective:     BP 104/64   Pulse 63   Ht 5\' 4"  (1.626 m)   Wt 122 lb 6.4 oz (55.5 kg)   SpO2 98%   BMI 21.01 kg/m  BP Readings from Last 3 Encounters:  02/07/24 104/64  11/22/23 (!) 118/54  09/08/23 133/61   Physical Exam Vitals reviewed.  Constitutional:      General: She is not in acute distress.    Appearance: Normal appearance. She is not toxic-appearing.  HENT:     Head: Normocephalic and atraumatic.     Right Ear: External ear normal.     Left Ear: External ear normal.     Nose: Nose normal. No  congestion or rhinorrhea.     Mouth/Throat:     Mouth: Mucous membranes are moist.     Pharynx: Oropharynx is clear. No oropharyngeal exudate or posterior oropharyngeal erythema.  Eyes:     General: No scleral icterus.    Extraocular Movements: Extraocular movements intact.     Conjunctiva/sclera: Conjunctivae normal.     Pupils: Pupils are equal, round, and reactive to light.  Cardiovascular:  Rate and Rhythm: Normal rate and regular rhythm.     Pulses: Normal pulses.     Heart sounds: Normal heart sounds. No murmur heard.    No friction rub. No gallop.  Pulmonary:     Effort: Pulmonary effort is normal.     Breath sounds: Normal breath sounds. No wheezing, rhonchi or rales.  Abdominal:     General: Abdomen is flat. Bowel sounds are normal. There is no distension.     Palpations: Abdomen is soft.     Tenderness: There is no abdominal tenderness.  Musculoskeletal:        General: No swelling. Normal range of motion.     Cervical back: Normal range of motion.     Right lower leg: No edema.     Left lower leg: No edema.  Lymphadenopathy:     Cervical: No cervical adenopathy.  Skin:    General: Skin is warm and dry.     Capillary Refill: Capillary refill takes less than 2 seconds.     Coloration: Skin is not jaundiced.  Neurological:     General: No focal deficit present.     Mental Status: She is alert and oriented to person, place, and time.  Psychiatric:        Mood and Affect: Mood normal.        Behavior: Behavior normal.   Last CBC Lab Results  Component Value Date   WBC 8.2 01/03/2024   HGB 11.4 01/03/2024   HCT 34.9 01/03/2024   MCV 98 (H) 01/03/2024   MCH 31.9 01/03/2024   RDW 12.2 01/03/2024   PLT 302 01/03/2024   Last metabolic panel Lab Results  Component Value Date   GLUCOSE 74 01/03/2024   NA 144 01/03/2024   K 3.7 01/03/2024   CL 111 (H) 01/03/2024   CO2 17 (L) 01/03/2024   BUN 46 (H) 01/03/2024   CREATININE 1.56 (H) 01/03/2024   EGFR 34 (L)  01/03/2024   CALCIUM  9.6 01/03/2024   PROT 7.4 01/03/2024   ALBUMIN 4.5 01/03/2024   LABGLOB 2.9 01/03/2024   BILITOT 0.2 01/03/2024   ALKPHOS 61 01/03/2024   AST 8 01/03/2024   ALT 13 01/03/2024   ANIONGAP 11 12/19/2021   Last lipids Lab Results  Component Value Date   CHOL 140 01/03/2024   HDL 42 01/03/2024   LDLCALC 79 01/03/2024   TRIG 103 01/03/2024   CHOLHDL 3.3 01/03/2024   Last hemoglobin A1c Lab Results  Component Value Date   HGBA1C 6.1 (H) 01/03/2024   Last thyroid  functions Lab Results  Component Value Date   TSH 3.070 01/03/2024   Last vitamin D  Lab Results  Component Value Date   VD25OH 57.2 01/03/2024   Last vitamin B12 and Folate Lab Results  Component Value Date   VITAMINB12 >2000 (H) 01/03/2024   FOLATE >20.0 01/03/2024      Assessment & Plan:    Routine Health Maintenance and Physical Exam  Immunization History  Administered Date(s) Administered   Fluad Trivalent(High Dose 65+) 08/09/2023   Influenza, High Dose Seasonal PF 07/28/2018, 07/08/2019   Influenza-Unspecified 08/12/2014, 07/12/2016, 08/12/2018   PNEUMOCOCCAL CONJUGATE-20 08/19/2018   Pneumococcal Conjugate-13 08/19/2018   Zoster Recombinant(Shingrix) 01/14/2017, 05/09/2019    Health Maintenance  Topic Date Due   Medicare Annual Wellness (AWV)  Never done   COVID-19 Vaccine (1) Never done   Hepatitis C Screening  Never done   DTaP/Tdap/Td (1 - Tdap) Never done   INFLUENZA VACCINE  05/12/2024  Pneumonia Vaccine 70+ Years old  Completed   DEXA SCAN  Completed   Zoster Vaccines- Shingrix  Completed   HPV VACCINES  Aged Out   Meningococcal B Vaccine  Aged Out   Colonoscopy  Discontinued    Discussed health benefits of physical activity, and encouraged her to engage in regular exercise appropriate for her age and condition.  Problem List Items Addressed This Visit       Essential hypertension - Primary   BP is borderline low in office today.  She checks her blood  pressure daily at home and reports diastolic readings consistently ranging 40-50 mmHg.  She is currently taking losartan  25 mg twice daily and Toprol -XL 37.5 mg daily.  Amlodipine  has been discontinued.  She continues to take HCTZ as needed if she feels that her blood pressure is elevated.  We discussed that hypotension could contribute to symptoms of nausea. -Recommend reducing frequency of losartan  to 25 mg daily.  Continue Toprol -XL as currently prescribed.  Recommend discontinuing HCTZ.  Follow-up in 3 months for reassessment.      CKD stage 3b, GFR 30-44 ml/min (HCC)   Labs from 3/24 remain consistent with CKD stage IIIb.  She has established care with nephrology (Dr. Carrolyn Clan) and has follow-up scheduled for next week.  Currently on ARB.  Discontinue HCTZ.      GAD (generalized anxiety disorder)   Symptoms remain well-controlled with lorazepam .  PDMP reviewed and is appropriate.      Prediabetes   A1c 6.1 on labs from last month.  She will focus on dietary changes in an effort to lower her blood sugar.      Encounter for well adult exam with abnormal findings   Annual physical completed today.  Previous records and labs reviewed. -Labs from 3/24 reviewed -Recommend reducing losartan  to 25 mg daily as otherwise documented -Preventative care items are largely up-to-date -We will tentatively plan for follow-up in 3 months      Return in about 3 months (around 05/08/2024).  Tobi Fortes, MD

## 2024-02-07 NOTE — Assessment & Plan Note (Addendum)
 Labs from 3/24 remain consistent with CKD stage IIIb.  She has established care with nephrology (Dr. Carrolyn Clan) and has follow-up scheduled for next week.  Currently on ARB.  Discontinue HCTZ.

## 2024-02-07 NOTE — Assessment & Plan Note (Signed)
 Symptoms remain well-controlled with lorazepam .  PDMP reviewed and is appropriate.

## 2024-02-10 DIAGNOSIS — R760 Raised antibody titer: Secondary | ICD-10-CM | POA: Diagnosis not present

## 2024-02-10 DIAGNOSIS — R809 Proteinuria, unspecified: Secondary | ICD-10-CM | POA: Diagnosis not present

## 2024-02-10 DIAGNOSIS — N1832 Chronic kidney disease, stage 3b: Secondary | ICD-10-CM | POA: Diagnosis not present

## 2024-02-10 DIAGNOSIS — I129 Hypertensive chronic kidney disease with stage 1 through stage 4 chronic kidney disease, or unspecified chronic kidney disease: Secondary | ICD-10-CM | POA: Diagnosis not present

## 2024-02-17 ENCOUNTER — Other Ambulatory Visit (HOSPITAL_COMMUNITY): Payer: Self-pay | Admitting: Nephrology

## 2024-02-17 DIAGNOSIS — N1832 Chronic kidney disease, stage 3b: Secondary | ICD-10-CM

## 2024-02-17 DIAGNOSIS — R002 Palpitations: Secondary | ICD-10-CM

## 2024-02-17 DIAGNOSIS — I351 Nonrheumatic aortic (valve) insufficiency: Secondary | ICD-10-CM

## 2024-02-17 DIAGNOSIS — F411 Generalized anxiety disorder: Secondary | ICD-10-CM

## 2024-02-17 DIAGNOSIS — H401133 Primary open-angle glaucoma, bilateral, severe stage: Secondary | ICD-10-CM

## 2024-02-17 DIAGNOSIS — I10 Essential (primary) hypertension: Secondary | ICD-10-CM

## 2024-02-18 ENCOUNTER — Telehealth: Payer: Self-pay | Admitting: Internal Medicine

## 2024-02-18 NOTE — Telephone Encounter (Signed)
 Spoke to patient

## 2024-02-18 NOTE — Telephone Encounter (Signed)
 Copied from CRM [id number]. Topic: General - Other >> Feb 18, 2024  8:41 AM Emylou G wrote: Reason for CRM: Patient called.. wants to talk to Dr Caffie Castilla Nurse in regards to her and her husbands medications that needs refill.. Pls call her number on file

## 2024-02-21 ENCOUNTER — Other Ambulatory Visit: Payer: Self-pay | Admitting: Internal Medicine

## 2024-02-23 ENCOUNTER — Ambulatory Visit: Payer: Self-pay

## 2024-02-23 VITALS — BP 123/70 | HR 74 | Ht 64.0 in | Wt 123.1 lb

## 2024-02-23 DIAGNOSIS — R21 Rash and other nonspecific skin eruption: Secondary | ICD-10-CM

## 2024-02-23 MED ORDER — METHYLPREDNISOLONE 4 MG PO TBPK: ORAL_TABLET | ORAL | 0 refills | Status: DC

## 2024-02-23 NOTE — Progress Notes (Signed)
   Acute Office Visit  Subjective:     Patient ID: Sandra Henry, female    DOB: February 02, 1946, 78 y.o.   MRN: [id number]  Chief Complaint  Patient presents with   Medical Management of Chronic Issues    Pt states "Rash she thinks its shingles"     HPI Patient is in today for rash on back  Review of Systems  Constitutional: Negative.   HENT: Negative.    Respiratory: Negative.    Cardiovascular: Negative.   Skin:  Positive for itching and rash.  Neurological: Negative.   Psychiatric/Behavioral: Negative.          Objective:    BP 123/70   Pulse 74   Ht 5\' 4"  (1.626 m)   Wt 123 lb 1.9 oz (55.8 kg)   SpO2 96%   BMI 21.13 kg/m    Physical Exam Vitals and nursing note reviewed.  Constitutional:      Appearance: Normal appearance.  Eyes:     Extraocular Movements: Extraocular movements intact.     Pupils: Pupils are equal, round, and reactive to light.  Cardiovascular:     Rate and Rhythm: Normal rate and regular rhythm.  Pulmonary:     Effort: Pulmonary effort is normal.     Breath sounds: Normal breath sounds.  Neurological:     Mental Status: She is alert and oriented to person, place, and time.  Psychiatric:        Mood and Affect: Mood normal.        Thought Content: Thought content normal.     No results found for any visits on 02/23/24.      Assessment & Plan:   Problem List Items Addressed This Visit   None Visit Diagnoses       Rash and nonspecific skin eruption    -  Primary   Relevant Medications   methylPREDNISolone  (MEDROL  DOSEPAK) 4 MG TBPK tablet       Meds ordered this encounter  Medications   methylPREDNISolone  (MEDROL  DOSEPAK) 4 MG TBPK tablet    Sig: Take as package instructions.    Dispense:  1 each    Refill:  0    No follow-ups on file.  Alison Irvine, FNP

## 2024-03-02 DIAGNOSIS — M79674 Pain in right toe(s): Secondary | ICD-10-CM | POA: Diagnosis not present

## 2024-03-02 DIAGNOSIS — B351 Tinea unguium: Secondary | ICD-10-CM | POA: Diagnosis not present

## 2024-03-02 DIAGNOSIS — M79675 Pain in left toe(s): Secondary | ICD-10-CM | POA: Diagnosis not present

## 2024-03-14 NOTE — H&P (Signed)
 Chief Complaint: Essential hypertension; Chronic kidney disease - image guided kidney biopsy   Referring Provider(s): Carrolyn Clan, Manpreet  Supervising Physician: Creasie Doctor  Patient Status: [facility] - Out-pt  History of Present Illness: Sandra Henry is a 78 y.o. female with history of glaucoma, palpitations, hypertension, hypercalcemia, migraine, and chronic kidney disease.  Routine labs from PCP on 01/03/24 were consistent with CKD stage IIIb, pt was referred to Dr. Carrolyn Clan of nephrology.  Dr. Carrolyn Clan referred the pt to interventional radiology for image guided kidney biopsy for further diagnostic workup.    Patient is Full Code  Past Medical History:  Diagnosis Date   Dental bridge present    upper   Dental crowns present    Dizziness and giddiness    Glaucoma    Hypercalcemia    Hypertension    under control with med., has been on med. x 4 yr.   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   index, middle and ring fingers    Past Surgical History:  Procedure Laterality Date   BREAST BIOPSY Left    CATARACT EXTRACTION W/ INTRAOCULAR LENS IMPLANT Bilateral    COLONOSCOPY N/A 01/30/2021   Procedure: COLONOSCOPY;  Surgeon: Ruby Corporal, MD;  [facility];  Service: Endoscopy;  Laterality: N/A;  am   TRIGGER FINGER RELEASE Right 02/20/2014   Procedure: RELEASE A-1 PULLEY RIGHT INDEX,  MIDDLE AND RING FINGERS ;  Surgeon: Kemp Patter, MD;  Location: [facility];  Service: Orthopedics;  Laterality: Right;   TUBAL LIGATION     laparoscopic    Allergies: Codeine, Penicillins, and Sulfa antibiotics  Medications: Prior to Admission medications   Medication Sig Start Date End Date Taking? Authorizing Provider  Ascorbic Acid (VITAMIN C) 1000 MG tablet Take 1,000 mg by mouth daily.    [provider]  aspirin 81 MG tablet Take 81 mg by mouth daily.    [provider]  Calcium -Magnesium-Vitamin D  600-40-500 MG-MG-UNIT TB24 Take 1 tablet by mouth 2 (two) times daily.    [provider]  cholecalciferol (VITAMIN D ) 400 UNITS TABS tablet Take 400 Units by mouth 2 (two) times daily.    [provider]  estradiol (ESTRACE) 0.1 MG/GM vaginal cream Place vaginally.    [provider]  Garlic 10 MG CAPS Take 10 mg by mouth daily.    [provider]  Glucosamine-Chondroitin 500-400 MG CAPS Take 2 tablets by mouth daily.    [provider]  LORazepam  (ATIVAN ) 1 MG tablet take 0.5 tablets (0.5 milligram total) by mouth 2 (two) times daily as needed for anxiety. 02/21/24   Meldon Sport, MD  losartan  (COZAAR ) 25 MG tablet Take 25 mg by mouth daily. 10/10/23   [provider]  Meclizine HCl 25 MG CHEW Chew 25 mg by mouth.    [provider]  methylPREDNISolone  (MEDROL  DOSEPAK) 4 MG TBPK tablet Take as package instructions. 02/23/24   Alison Irvine, FNP  metoprolol  succinate (TOPROL -XL) 25 MG 24 hr tablet Take 1.5 tablets (37.5 mg total) by mouth 2 (two) times daily. 10/29/23   Dixon, Phillip E, MD  Multiple Vitamin (MULTIVITAMIN) tablet Take 1 tablet by mouth daily.    [provider]  Netarsudil-Latanoprost (ROCKLATAN) 0.02-0.005 % SOLN Place 1 drop into both eyes at bedtime.    [provider]  Omega-3 Fatty Acids (FISH OIL) 1000 MG  CAPS Take 1,000 mg by mouth daily.    [provider]  omeprazole  (PRILOSEC) 20 MG capsule Take 1 capsule (20 mg total) by mouth daily. 11/22/23   Meldon Sport, MD  ondansetron  (ZOFRAN ) 4 MG tablet Take 1 tablet (4 mg total) by mouth every 8 (eight) hours as needed for nausea or vomiting. 11/22/23   Meldon Sport, MD  rosuvastatin  (CRESTOR ) 10 MG tablet Take 1 tablet (10 mg total) by mouth 2 (two) times daily. 10/29/23   Tobi Fortes, MD  timolol  (TIMOPTIC ) 0.5 % ophthalmic solution Place 1 drop into both eyes every morning. 10/29/23    Tobi Fortes, MD  UNABLE TO FIND Med Name: lemon-ginger honey stomach settle    [provider]     Family History  Problem Relation Age of Onset   Stroke Mother    Migraines Mother    Stroke Father    Heart attack Brother    Stroke Sister     Social History   Socioeconomic History   Marital status: Married    Spouse name: Not on file   Number of children: Not on file   Years of education: Not on file   Highest education level: Not on file  Occupational History   Occupation: medical receptionist    Employer: [employer]  Tobacco Use   Smoking status: Never   Smokeless tobacco: Never  Vaping Use   Vaping status: Never Used  Substance and Sexual Activity   Alcohol use: No   Drug use: No   Sexual activity: Not on file  Other Topics Concern   Not on file  Social History Narrative   Lives at home w/ her husband   Left-handed   Caffeine: occasional soft drink   Social Drivers of Corporate investment banker Strain: Not on file  Food Insecurity: Not on file  Transportation Needs: Not on file  Physical Activity: Not on file  Stress: Not on file  Social Connections: Unknown (04/30/2023)   Received from [facility]   Social Network    Social Network: Not on file     Review of Systems: A 12 point ROS discussed and pertinent positives are indicated in the HPI above.  All other systems are negative.  Review of Systems  Constitutional:  Negative for chills, fatigue and fever.  Respiratory:  Negative for cough, shortness of breath and wheezing.   Gastrointestinal:  Negative for diarrhea, nausea and vomiting.  Neurological:  Negative for dizziness and headaches.  Psychiatric/Behavioral:  Negative for agitation, behavioral problems and confusion.     Vital Signs: BP (!) 151/60 (BP Location: Right Arm)   Pulse 62   Temp 97.8 F (36.6 C)   Resp 18   Ht 5\' 4"  (1.626 m)   Wt 124 lb (56.2 kg)   SpO2 99%   BMI 21.28 kg/m   Advance Care Plan: The  advanced care place/surrogate decision maker was discussed at the time of visit and the patient did not wish to discuss or was not able to name a surrogate decision maker or provide an advance care plan.  Physical Exam Constitutional:      Appearance: She is well-developed.  HENT:     Head: Atraumatic.     Mouth/Throat:     Mouth: Mucous membranes are moist.  Cardiovascular:     Rate and Rhythm: Normal rate and regular rhythm.     Heart sounds: No murmur heard. Pulmonary:     Effort: Pulmonary  effort is normal.     Breath sounds: Normal breath sounds.  Abdominal:     General: Bowel sounds are normal.     Palpations: Abdomen is soft.  Musculoskeletal:        General: Normal range of motion.  Skin:    General: Skin is warm.  Neurological:     Mental Status: She is alert and oriented to person, place, and time.  Psychiatric:        Mood and Affect: Mood normal.        Behavior: Behavior normal.     Imaging: No results found.  Labs:  CBC: Recent Labs    01/03/24 0754 03/16/24 0640  WBC 8.2 6.0  HGB 11.4 13.0  HCT 34.9 38.9  PLT 302 268    COAGS: Recent Labs    03/16/24 0640  INR 1.0    BMP: Recent Labs    09/08/23 1443 09/20/23 0952 01/03/24 0754  NA 141 145* 144  K 4.1 4.7 3.7  CL 102 109* 111*  CO2 21 19* 17*  GLUCOSE 112* 69* 74  BUN 32* 37* 46*  CALCIUM  11.5* 10.1 9.6  CREATININE 1.08* 1.43* 1.56*    LIVER FUNCTION TESTS: Recent Labs    09/20/23 0952 01/03/24 0754  BILITOT 0.3 0.2  AST 10 8  ALT 17 13  ALKPHOS 64 61  PROT 7.4 7.4  ALBUMIN 4.3 4.5    TUMOR MARKERS: No results for input(s): "AFPTM", "CEA", "CA199", "CHROMGRNA" in the last 8760 hours.  Assessment and Plan:  Pt is with essential hypertension and chronic kidney disease scheduled for image guided kidney biopsy 03/16/24.    Risks and benefits of image guided kidney biopsy was discussed with the patient and/or patient's family including, but not limited to bleeding,  infection, damage to adjacent structures or low yield requiring additional tests.  All of the questions were answered and there is agreement to proceed.  Consent signed and in chart.  Thank you for allowing our service to participate in Sandra Henry 's care.  Electronically Signed: Pasty Bongo, PA-C   03/16/2024, 7:53 AM    I spent a total of  30 Minutes   in face to face in clinical consultation, greater than 50% of which was counseling/coordinating care for image guided kidney biopsy.

## 2024-03-15 ENCOUNTER — Other Ambulatory Visit: Payer: Self-pay | Admitting: Interventional Radiology

## 2024-03-15 DIAGNOSIS — Z01818 Encounter for other preprocedural examination: Secondary | ICD-10-CM

## 2024-03-16 ENCOUNTER — Ambulatory Visit (HOSPITAL_COMMUNITY)
Admission: RE | Admit: 2024-03-16 | Discharge: 2024-03-16 | Disposition: A | Discharge status: Home / Self Care | Source: Ambulatory Visit | Attending: Nephrology | Admitting: Nephrology

## 2024-03-16 ENCOUNTER — Encounter (HOSPITAL_COMMUNITY): Payer: Self-pay

## 2024-03-16 ENCOUNTER — Other Ambulatory Visit: Payer: Self-pay

## 2024-03-16 DIAGNOSIS — I10 Essential (primary) hypertension: Secondary | ICD-10-CM

## 2024-03-16 DIAGNOSIS — R002 Palpitations: Secondary | ICD-10-CM | POA: Diagnosis not present

## 2024-03-16 DIAGNOSIS — I351 Nonrheumatic aortic (valve) insufficiency: Secondary | ICD-10-CM | POA: Insufficient documentation

## 2024-03-16 DIAGNOSIS — Z01818 Encounter for other preprocedural examination: Secondary | ICD-10-CM

## 2024-03-16 DIAGNOSIS — I129 Hypertensive chronic kidney disease with stage 1 through stage 4 chronic kidney disease, or unspecified chronic kidney disease: Secondary | ICD-10-CM | POA: Insufficient documentation

## 2024-03-16 DIAGNOSIS — N1832 Chronic kidney disease, stage 3b: Secondary | ICD-10-CM | POA: Diagnosis not present

## 2024-03-16 DIAGNOSIS — H401133 Primary open-angle glaucoma, bilateral, severe stage: Secondary | ICD-10-CM | POA: Insufficient documentation

## 2024-03-16 DIAGNOSIS — F411 Generalized anxiety disorder: Secondary | ICD-10-CM | POA: Diagnosis not present

## 2024-03-16 DIAGNOSIS — H409 Unspecified glaucoma: Secondary | ICD-10-CM | POA: Insufficient documentation

## 2024-03-16 LAB — PROTIME-INR
INR: 1 (ref 0.8–1.2)
Prothrombin Time: 13.4 s (ref 11.4–15.2)

## 2024-03-16 LAB — CBC
HCT: 38.9 % (ref 36.0–46.0)
Hemoglobin: 13 g/dL (ref 12.0–15.0)
MCH: 33.4 pg (ref 26.0–34.0)
MCHC: 33.4 g/dL (ref 30.0–36.0)
MCV: 100 fL (ref 80.0–100.0)
Platelets: 268 10*3/uL (ref 150–400)
RBC: 3.89 MIL/uL (ref 3.87–5.11)
RDW: 12.1 % (ref 11.5–15.5)
WBC: 6 10*3/uL (ref 4.0–10.5)
nRBC: 0 % (ref 0.0–0.2)

## 2024-03-16 MED ORDER — MIDAZOLAM HCL 2 MG/2ML IJ SOLN
INTRAMUSCULAR | Status: AC | PRN
  Administered 2024-03-16: .5 mg via INTRAVENOUS
  Administered 2024-03-16: 1 mg via INTRAVENOUS

## 2024-03-16 MED ORDER — MIDAZOLAM HCL 2 MG/2ML IJ SOLN
INTRAMUSCULAR | Status: AC
  Filled 2024-03-16: qty 2

## 2024-03-16 MED ORDER — LIDOCAINE HCL (PF) 1 % IJ SOLN
10.0000 mL | Freq: Once | INTRAMUSCULAR | Status: AC
  Administered 2024-03-16: 10 mL via INTRADERMAL

## 2024-03-16 MED ORDER — GELATIN ABSORBABLE 12-7 MM EX MISC
1.0000 | Freq: Once | CUTANEOUS | Status: AC
  Administered 2024-03-16: 1 via TOPICAL

## 2024-03-16 MED ORDER — FENTANYL CITRATE (PF) 100 MCG/2ML IJ SOLN
INTRAMUSCULAR | Status: AC
  Filled 2024-03-16: qty 2

## 2024-03-16 MED ORDER — FENTANYL CITRATE (PF) 100 MCG/2ML IJ SOLN
INTRAMUSCULAR | Status: AC | PRN
  Administered 2024-03-16 (×2): 25 ug via INTRAVENOUS

## 2024-03-16 NOTE — Procedures (Addendum)
 Interventional Radiology Procedure Note  Procedure: Ultrasound guided right renal biopsy   Findings: Please refer to procedural dictation for full description. 18 ga core x2 from inferior pole.  Gelfoam slurry needle track embolization.  Complications: None immediate  Estimated Blood Loss: < 5 ml  Recommendations: Strict 3 hour bedrest. Follow Pathology results.   Creasie Doctor, MD

## 2024-03-20 ENCOUNTER — Other Ambulatory Visit: Payer: Self-pay | Admitting: Internal Medicine

## 2024-03-20 ENCOUNTER — Encounter (HOSPITAL_COMMUNITY): Payer: Self-pay

## 2024-03-20 NOTE — Telephone Encounter (Unsigned)
 Copied from CRM [id number]. Topic: Clinical - Medication Refill >> Mar 20, 2024  4:45 PM Fredrica W wrote: Medication:  losartan  (COZAAR ) 25 MG tablet LORazepam  (ATIVAN ) 1 MG tablet   Has the patient contacted their pharmacy? No (Agent: If no, request that the patient contact the pharmacy for the refill. If patient does not wish to contact the pharmacy document the reason why and proceed with request.) (Agent: If yes, when and what did the pharmacy advise?)  This is the patient's preferred pharmacy:   [facility] - [place], [place] - [address] [place] [address] Phone: [phone] Fax: [phone]  Is this the correct pharmacy for this prescription? Yes If no, delete pharmacy and type the correct one.   Has the prescription been filled recently? Yes  Is the patient out of the medication? No  Has the patient been seen for an appointment in the last year OR does the patient have an upcoming appointment? Yes  Can we respond through MyChart? No  Agent: Please be advised that Rx refills may take up to 3 business days. We ask that you follow-up with your pharmacy.

## 2024-03-21 MED ORDER — LORAZEPAM 1 MG PO TABS: 0.5000 mg | ORAL_TABLET | Freq: Two times a day (BID) | ORAL | 0 refills | Status: DC | PRN

## 2024-03-21 MED ORDER — LOSARTAN POTASSIUM 25 MG PO TABS: 25.0000 mg | ORAL_TABLET | Freq: Every day | ORAL | 0 refills | Status: DC

## 2024-03-23 DIAGNOSIS — R809 Proteinuria, unspecified: Secondary | ICD-10-CM | POA: Diagnosis not present

## 2024-03-23 DIAGNOSIS — I129 Hypertensive chronic kidney disease with stage 1 through stage 4 chronic kidney disease, or unspecified chronic kidney disease: Secondary | ICD-10-CM | POA: Diagnosis not present

## 2024-03-23 DIAGNOSIS — I351 Nonrheumatic aortic (valve) insufficiency: Secondary | ICD-10-CM | POA: Diagnosis not present

## 2024-03-23 DIAGNOSIS — N1832 Chronic kidney disease, stage 3b: Secondary | ICD-10-CM | POA: Diagnosis not present

## 2024-03-23 LAB — SURGICAL PATHOLOGY

## 2024-04-14 ENCOUNTER — Other Ambulatory Visit: Payer: Self-pay | Admitting: Internal Medicine

## 2024-04-22 DIAGNOSIS — I129 Hypertensive chronic kidney disease with stage 1 through stage 4 chronic kidney disease, or unspecified chronic kidney disease: Secondary | ICD-10-CM | POA: Diagnosis not present

## 2024-04-22 DIAGNOSIS — E8722 Chronic metabolic acidosis: Secondary | ICD-10-CM | POA: Diagnosis not present

## 2024-04-22 DIAGNOSIS — R809 Proteinuria, unspecified: Secondary | ICD-10-CM | POA: Diagnosis not present

## 2024-04-22 DIAGNOSIS — N1832 Chronic kidney disease, stage 3b: Secondary | ICD-10-CM | POA: Diagnosis not present

## 2024-04-24 ENCOUNTER — Encounter: Payer: Self-pay | Admitting: Internal Medicine

## 2024-04-24 ENCOUNTER — Ambulatory Visit: Attending: Internal Medicine | Admitting: Internal Medicine

## 2024-04-24 VITALS — BP 130/60 | HR 56 | Ht 64.0 in | Wt 123.0 lb

## 2024-04-24 DIAGNOSIS — E785 Hyperlipidemia, unspecified: Secondary | ICD-10-CM | POA: Diagnosis not present

## 2024-04-24 DIAGNOSIS — I10 Essential (primary) hypertension: Secondary | ICD-10-CM

## 2024-04-24 DIAGNOSIS — I471 Supraventricular tachycardia, unspecified: Secondary | ICD-10-CM | POA: Insufficient documentation

## 2024-04-24 NOTE — Patient Instructions (Addendum)

## 2024-04-24 NOTE — Progress Notes (Signed)
 Cardiology Office Note  Date: 04/24/2024   ID: Sandra Henry, DOB 08-30-1946, MRN [id number]  PCP:  Melvenia Manus BRAVO, MD  Cardiologist:  Debby Sor, MD Electrophysiologist:  None   History of Present Illness: Sandra Henry is a 78 y.o. female  Here for follow-up visit of paroxysmal SVT.  She was previously followed by Dr. Sor for the management of paroxysmal SVT (atrial tachycardia) that has been very well-controlled on metoprolol  with no further episodes.  She is here for follow-up visit.  Has occasional palpitations on metoprolol .  No angina, DOE, exertional dizziness, syncope, leg swelling.  Past Medical History:  Diagnosis Date   Dental bridge present    upper   Dental crowns present    Dizziness and giddiness    Glaucoma    Hypercalcemia    Hypertension    under control with med., has been on med. x 4 yr.   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   index, middle and ring fingers    Past Surgical History:  Procedure Laterality Date   BREAST BIOPSY Left    CATARACT EXTRACTION W/ INTRAOCULAR LENS IMPLANT Bilateral    COLONOSCOPY N/A 01/30/2021   Procedure: COLONOSCOPY;  Surgeon: Golda Claudis PENNER, MD;  [facility];  Service: Endoscopy;  Laterality: N/A;  am   TRIGGER FINGER RELEASE Right 02/20/2014   Procedure: RELEASE A-1 PULLEY RIGHT INDEX,  MIDDLE AND RING FINGERS ;  Surgeon: Arley JONELLE Curia, MD;  Location: [facility];  Service: Orthopedics;  Laterality: Right;   TUBAL LIGATION     laparoscopic    Current Outpatient Medications  Medication Sig Dispense Refill   amLODipine  (NORVASC ) 10 MG tablet Take 10 mg by mouth daily. (Patient taking differently: Take 5 mg by mouth daily.)     Ascorbic Acid (VITAMIN C) 1000 MG tablet Take 1,000 mg by mouth daily.     aspirin 81 MG tablet Take 81 mg by mouth daily.     Calcium -Magnesium-Vitamin D  600-40-500  MG-MG-UNIT TB24 Take 1 tablet by mouth 2 (two) times daily.     cholecalciferol (VITAMIN D ) 400 UNITS TABS tablet Take 400 Units by mouth 2 (two) times daily.     estradiol (ESTRACE) 0.1 MG/GM vaginal cream Place vaginally.     Garlic 10 MG CAPS Take 10 mg by mouth daily.     Glucosamine-Chondroitin 500-400 MG CAPS Take 2 tablets by mouth daily.     LORazepam  (ATIVAN ) 1 MG tablet take 0.5 tablets (0.5 MILLIGRAM total) by mouth 2 (two) times daily as needed for anxiety. 60 tablet 0   losartan  (COZAAR ) 25 MG tablet Take 1 tablet (25 mg total) by mouth daily. 90 tablet 0   Meclizine HCl 25 MG CHEW Chew 25 mg by mouth.     methylPREDNISolone  (MEDROL  DOSEPAK) 4 MG TBPK tablet Take as package instructions. 1 each 0   metoprolol  succinate (TOPROL -XL) 25 MG 24 hr tablet Take 1.5 tablets (37.5 mg total) by mouth 2 (two) times daily. 90 tablet 0   Multiple Vitamin (MULTIVITAMIN) tablet Take 1 tablet by mouth daily.     Netarsudil-Latanoprost (ROCKLATAN) 0.02-0.005 % SOLN Place 1 drop into both eyes at bedtime.     Omega-3 Fatty Acids (FISH OIL) 1000 MG CAPS Take 1,000 mg by mouth daily.     omeprazole  (PRILOSEC) 20 MG capsule Take 1 capsule (20 mg total) by mouth daily.  30 capsule 2   ondansetron  (ZOFRAN ) 4 MG tablet Take 1 tablet (4 mg total) by mouth every 8 (eight) hours as needed for nausea or vomiting. 20 tablet 0   rosuvastatin  (CRESTOR ) 10 MG tablet Take 1 tablet (10 mg total) by mouth 2 (two) times daily. 180 tablet 0   timolol  (TIMOPTIC ) 0.5 % ophthalmic solution Place 1 drop into both eyes every morning. 10 mL 0   UNABLE TO FIND Med Name: lemon-ginger honey stomach settle     No current facility-administered medications for this visit.   Allergies:  Codeine, Penicillins, and Sulfa antibiotics   Social History: The patient  reports that she has never smoked. She has never used smokeless tobacco. She reports that she does not drink alcohol and does not use drugs.   Family History: The  patient's family history includes Heart attack in her brother; Migraines in her mother; Stroke in her father, mother, and sister.   ROS:  Please see the history of present illness. Otherwise, complete review of systems is positive for none  All other systems are reviewed and negative.   Physical Exam: VS:  BP 130/60 (BP Location: Left Arm, Patient Position: Sitting)   Pulse (!) 56   Ht 5' 4 (1.626 m)   Wt 123 lb (55.8 kg)   SpO2 99%   BMI 21.11 kg/m , BMI Body mass index is 21.11 kg/m.  Wt Readings from Last 3 Encounters:  04/24/24 123 lb (55.8 kg)  03/16/24 124 lb (56.2 kg)  02/23/24 123 lb 1.9 oz (55.8 kg)    General: Patient appears comfortable at rest. HEENT: Conjunctiva and lids normal, oropharynx clear with moist mucosa. Neck: Supple, no elevated JVP or carotid bruits, no thyromegaly. Lungs: Clear to auscultation, nonlabored breathing at rest. Cardiac: Regular rate and rhythm, no S3 or significant systolic murmur, no pericardial rub. Abdomen: Soft, nontender, no hepatomegaly, bowel sounds present, no guarding or rebound. Extremities: No pitting edema, distal pulses 2+. Skin: Warm and dry. Musculoskeletal: No kyphosis. Neuropsychiatric: Alert and oriented x3, affect grossly appropriate.  Recent Labwork: 01/03/2024: ALT 13; AST 8; BUN 46; Creatinine, Ser 1.56; Potassium 3.7; Sodium 144; TSH 3.070 03/16/2024: Hemoglobin 13.0; Platelets 268     Component Value Date/Time   CHOL 140 01/03/2024 0754   TRIG 103 01/03/2024 0754   HDL 42 01/03/2024 0754   CHOLHDL 3.3 01/03/2024 0754   CHOLHDL 4.4 10/13/2016 0852   VLDL 32 (H) 10/13/2016 0852   LDLCALC 79 01/03/2024 0754    Assessment and Plan:   Paroxysmal SVT (atrial tachycardia): Occasional palpitations on metoprolol .  Continue metoprolol  succinate 37.5 mg twice daily.  HTN, controlled: Continue current antihypertensive medications, losartan  25 mg once daily.  Management of HTN per PCP.      Medication  Adjustments/Labs and Tests Ordered: Current medicines are reviewed at length with the patient today.  Concerns regarding medicines are outlined above.    Disposition:  Follow up 1 year  Signed Romilda Proby Priya Cyndie Woodbeck, MD, 04/24/2024 11:26 AM    [facility] [address] [place], [place], [place] [address]

## 2024-05-08 ENCOUNTER — Ambulatory Visit

## 2024-05-08 DIAGNOSIS — I129 Hypertensive chronic kidney disease with stage 1 through stage 4 chronic kidney disease, or unspecified chronic kidney disease: Secondary | ICD-10-CM | POA: Diagnosis not present

## 2024-05-08 DIAGNOSIS — R059 Cough, unspecified: Secondary | ICD-10-CM | POA: Diagnosis not present

## 2024-05-08 DIAGNOSIS — I6523 Occlusion and stenosis of bilateral carotid arteries: Secondary | ICD-10-CM | POA: Diagnosis not present

## 2024-05-08 DIAGNOSIS — M79601 Pain in right arm: Secondary | ICD-10-CM | POA: Diagnosis not present

## 2024-05-08 DIAGNOSIS — I3481 Nonrheumatic mitral (valve) annulus calcification: Secondary | ICD-10-CM | POA: Diagnosis not present

## 2024-05-08 DIAGNOSIS — E785 Hyperlipidemia, unspecified: Secondary | ICD-10-CM | POA: Diagnosis not present

## 2024-05-08 DIAGNOSIS — Z8673 Personal history of transient ischemic attack (TIA), and cerebral infarction without residual deficits: Secondary | ICD-10-CM | POA: Diagnosis not present

## 2024-05-08 DIAGNOSIS — N189 Chronic kidney disease, unspecified: Secondary | ICD-10-CM | POA: Diagnosis not present

## 2024-05-08 DIAGNOSIS — I361 Nonrheumatic tricuspid (valve) insufficiency: Secondary | ICD-10-CM | POA: Diagnosis not present

## 2024-05-08 DIAGNOSIS — R200 Anesthesia of skin: Secondary | ICD-10-CM | POA: Diagnosis not present

## 2024-05-08 DIAGNOSIS — Z79899 Other long term (current) drug therapy: Secondary | ICD-10-CM | POA: Diagnosis not present

## 2024-05-08 DIAGNOSIS — J9811 Atelectasis: Secondary | ICD-10-CM | POA: Diagnosis not present

## 2024-05-08 DIAGNOSIS — G459 Transient cerebral ischemic attack, unspecified: Secondary | ICD-10-CM | POA: Diagnosis not present

## 2024-05-08 DIAGNOSIS — I771 Stricture of artery: Secondary | ICD-10-CM | POA: Diagnosis not present

## 2024-05-08 DIAGNOSIS — I517 Cardiomegaly: Secondary | ICD-10-CM | POA: Diagnosis not present

## 2024-05-08 DIAGNOSIS — R202 Paresthesia of skin: Secondary | ICD-10-CM | POA: Diagnosis not present

## 2024-05-08 DIAGNOSIS — M4302 Spondylolysis, cervical region: Secondary | ICD-10-CM | POA: Diagnosis not present

## 2024-05-08 DIAGNOSIS — I351 Nonrheumatic aortic (valve) insufficiency: Secondary | ICD-10-CM | POA: Diagnosis not present

## 2024-05-08 DIAGNOSIS — M8580 Other specified disorders of bone density and structure, unspecified site: Secondary | ICD-10-CM | POA: Diagnosis not present

## 2024-05-08 DIAGNOSIS — I10 Essential (primary) hypertension: Secondary | ICD-10-CM | POA: Diagnosis not present

## 2024-05-08 DIAGNOSIS — R918 Other nonspecific abnormal finding of lung field: Secondary | ICD-10-CM | POA: Diagnosis not present

## 2024-05-08 DIAGNOSIS — I340 Nonrheumatic mitral (valve) insufficiency: Secondary | ICD-10-CM | POA: Diagnosis not present

## 2024-05-08 DIAGNOSIS — R930 Abnormal findings on diagnostic imaging of skull and head, not elsewhere classified: Secondary | ICD-10-CM | POA: Diagnosis not present

## 2024-05-08 DIAGNOSIS — Z7901 Long term (current) use of anticoagulants: Secondary | ICD-10-CM | POA: Diagnosis not present

## 2024-05-08 DIAGNOSIS — I639 Cerebral infarction, unspecified: Secondary | ICD-10-CM | POA: Diagnosis not present

## 2024-05-08 DIAGNOSIS — H4089 Other specified glaucoma: Secondary | ICD-10-CM | POA: Diagnosis not present

## 2024-05-10 ENCOUNTER — Inpatient Hospital Stay: Payer: Self-pay

## 2024-05-17 ENCOUNTER — Encounter: Payer: Self-pay | Admitting: Internal Medicine

## 2024-05-22 ENCOUNTER — Ambulatory Visit (INDEPENDENT_AMBULATORY_CARE_PROVIDER_SITE_OTHER): Payer: Self-pay | Admitting: Internal Medicine

## 2024-05-22 ENCOUNTER — Encounter: Payer: Self-pay | Admitting: Internal Medicine

## 2024-05-22 VITALS — BP 128/68 | HR 61 | Ht 64.0 in | Wt 125.6 lb

## 2024-05-22 DIAGNOSIS — Z8673 Personal history of transient ischemic attack (TIA), and cerebral infarction without residual deficits: Secondary | ICD-10-CM | POA: Diagnosis not present

## 2024-05-22 DIAGNOSIS — Z09 Encounter for follow-up examination after completed treatment for conditions other than malignant neoplasm: Secondary | ICD-10-CM | POA: Diagnosis not present

## 2024-05-22 DIAGNOSIS — N1832 Chronic kidney disease, stage 3b: Secondary | ICD-10-CM

## 2024-05-22 DIAGNOSIS — R7989 Other specified abnormal findings of blood chemistry: Secondary | ICD-10-CM | POA: Diagnosis not present

## 2024-05-22 DIAGNOSIS — I10 Essential (primary) hypertension: Secondary | ICD-10-CM | POA: Diagnosis not present

## 2024-05-22 DIAGNOSIS — L308 Other specified dermatitis: Secondary | ICD-10-CM | POA: Diagnosis not present

## 2024-05-22 DIAGNOSIS — K2950 Unspecified chronic gastritis without bleeding: Secondary | ICD-10-CM | POA: Diagnosis not present

## 2024-05-22 DIAGNOSIS — G459 Transient cerebral ischemic attack, unspecified: Secondary | ICD-10-CM | POA: Insufficient documentation

## 2024-05-22 DIAGNOSIS — E782 Mixed hyperlipidemia: Secondary | ICD-10-CM

## 2024-05-22 DIAGNOSIS — L821 Other seborrheic keratosis: Secondary | ICD-10-CM | POA: Diagnosis not present

## 2024-05-22 MED ORDER — AMLODIPINE BESYLATE 5 MG PO TABS: 5.0000 mg | ORAL_TABLET | Freq: Every day | ORAL | 3 refills | Status: AC

## 2024-05-22 MED ORDER — LOSARTAN POTASSIUM 25 MG PO TABS: 25.0000 mg | ORAL_TABLET | Freq: Every day | ORAL | 3 refills | Status: DC

## 2024-05-22 MED ORDER — OMEPRAZOLE 20 MG PO CPDR: 20.0000 mg | DELAYED_RELEASE_CAPSULE | Freq: Every day | ORAL | 2 refills | Status: DC

## 2024-05-22 MED ORDER — ROSUVASTATIN CALCIUM 20 MG PO TABS: 20.0000 mg | ORAL_TABLET | Freq: Every day | ORAL | 1 refills | Status: AC

## 2024-05-22 NOTE — Progress Notes (Addendum)
 Established Patient Office Visit  Subjective:  Patient ID: Sandra Henry, female    DOB: 01/31/1946  Age: 78 y.o. MRN: [id number]  CC:  Chief Complaint  Patient presents with   Follow-up    Hospital f/u. States she is also due for her f/u visit.     HPI Sandra Henry is a 78 y.o. female with past medical history of HTN, HLD, glaucoma who presents for follow-up after recent hospitalization.  She presented with right sided arm heaviness and tingling as well as lip tingling on the top and bottom, both sides on 05/08/24. Symptoms at the time of ER visit had resolved.  Teleneuro was consulted. Medical admission was requested for further workup and management of TIA/ subacute thalamic CVA. CT head with concern for multiple thalamic infarcts of indeterminate age, but MRI showed no acute infarct, negative for acute finding of any kind. She was discharged on 05/08/24 with continuation of aspirin and statin.  Otherwise there has been no change in status. Patient has been taking medication as prescribed and there has been no recent change in medication or diet. There has been no recent illness/hospitalizations, travel or sick contacts.  She has chronic epigastric discomfort, nausea and lack of appetite.  She was given omeprazole  in 02/25 for possible chronic gastritis, but has not continued taking it.  Denies melena or hematochezia.   Past Medical History:  Diagnosis Date   Dental bridge present    upper   Dental crowns present    Dizziness and giddiness    Glaucoma    Hypercalcemia    Hypertension    under control with med., has been on med. x 4 yr.   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   index, middle and ring fingers    Past Surgical History:  Procedure Laterality Date   BREAST BIOPSY Left    CATARACT EXTRACTION W/ INTRAOCULAR LENS IMPLANT Bilateral    COLONOSCOPY N/A 01/30/2021   Procedure:  COLONOSCOPY;  Surgeon: Golda Claudis PENNER, MD;  [facility];  Service: Endoscopy;  Laterality: N/A;  am   TRIGGER FINGER RELEASE Right 02/20/2014   Procedure: RELEASE A-1 PULLEY RIGHT INDEX,  MIDDLE AND RING FINGERS ;  Surgeon: Arley JONELLE Curia, MD;  Location: [facility];  Service: Orthopedics;  Laterality: Right;   TUBAL LIGATION     laparoscopic    Family History  Problem Relation Age of Onset   Stroke Mother    Migraines Mother    Stroke Father    Heart attack Brother    Stroke Sister     Social History   Socioeconomic History   Marital status: Married    Spouse name: Not on file   Number of children: Not on file   Years of education: Not on file   Highest education level: Not on file  Occupational History   Occupation: medical receptionist    Employer: [employer]  Tobacco Use   Smoking status: Never   Smokeless tobacco: Never  Vaping Use   Vaping status: Never Used  Substance and Sexual Activity   Alcohol use: No   Drug use: No   Sexual activity: Not on file  Other Topics Concern   Not on file  Social History Narrative   Lives at home w/ her husband   Left-handed   Caffeine: occasional soft drink   Social Drivers of Health  Financial Resource Strain: Low Risk  (08/07/2024)   Overall Financial Resource Strain (CARDIA)    Difficulty of Paying Living Expenses: Not hard at all  Food Insecurity: No Food Insecurity (08/07/2024)   Hunger Vital Sign    Worried About Running Out of Food in the Last Year: Never true    Ran Out of Food in the Last Year: Never true  Transportation Needs: No Transportation Needs (08/07/2024)   PRAPARE - Administrator, Civil Service (Medical): No    Lack of Transportation (Non-Medical): No  Physical Activity: Sufficiently Active (08/07/2024)   Exercise Vital Sign    Days of Exercise per Week: 7 days    Minutes of Exercise per Session: 30 min  Recent Concern: Physical Activity - Inactive (06/25/2024)    Received from [facility]   Exercise Vital Sign    On average, how many days per week do you engage in moderate to strenuous exercise (like a brisk walk)?: 0 days    On average, how many minutes do you engage in exercise at this level?: 0 min  Stress: No Stress Concern Present (08/07/2024)   [employer] of Occupational Health - Occupational Stress Questionnaire    Feeling of Stress: Not at all  Social Connections: Socially Integrated (08/07/2024)   Social Connection and Isolation Panel    Frequency of Communication with Friends and Family: More than three times a week    Frequency of Social Gatherings with Friends and Family: More than three times a week    Attends Religious Services: More than 4 times per year    Active Member of [employer] or Organizations: Yes    Attends Engineer, Structural: More than 4 times per year    Marital Status: Married  Recent Concern: Social Connections - Moderately Isolated (06/25/2024)   Received from [facility]   Social Connection and Isolation Panel    In a typical week, how many times do you talk on the phone with family, friends, or neighbors?: More than three times a week    How often do you get together with friends or relatives?: More than three times a week    How often do you attend church or religious services?: Never    Do you belong to any clubs or organizations such as church groups, unions, fraternal or athletic groups, or school groups?: No    How often do you attend meetings of the clubs or organizations you belong to?: Never    Are you married, widowed, divorced, separated, never married, or living with a partner?: Married  Intimate Partner Violence: Not At Risk (08/07/2024)   Humiliation, Afraid, Rape, and Kick questionnaire    Fear of Current or Ex-Partner: No    Emotionally Abused: No    Physically Abused: No    Sexually Abused: No    Outpatient Medications Prior to Visit  Medication Sig Dispense Refill    Ascorbic Acid (VITAMIN C) 1000 MG tablet Take 1,000 mg by mouth daily.     aspirin 81 MG tablet Take 81 mg by mouth daily.     Calcium -Magnesium-Vitamin D  600-40-500 MG-MG-UNIT TB24 Take 1 tablet by mouth 2 (two) times daily.     cholecalciferol (VITAMIN D ) 400 UNITS TABS tablet Take 400 Units by mouth 2 (two) times daily.     estradiol (ESTRACE) 0.1 MG/GM vaginal cream Place vaginally.     Garlic 10 MG CAPS Take 10 mg by mouth daily.     Glucosamine-Chondroitin 500-400  MG CAPS Take 2 tablets by mouth daily.     Meclizine HCl 25 MG CHEW Chew 25 mg by mouth.     Multiple Vitamin (MULTIVITAMIN) tablet Take 1 tablet by mouth daily.     Netarsudil-Latanoprost (ROCKLATAN) 0.02-0.005 % SOLN Place 1 drop into both eyes at bedtime.     Omega-3 Fatty Acids (FISH OIL) 1000 MG CAPS Take 1,000 mg by mouth daily.     timolol  (TIMOPTIC ) 0.5 % ophthalmic solution Place 1 drop into both eyes every morning. 10 mL 0   amLODipine  (NORVASC ) 10 MG tablet Take 10 mg by mouth daily. (Patient taking differently: Take 5 mg by mouth daily.)     LORazepam  (ATIVAN ) 1 MG tablet take 0.5 tablets (0.5 MILLIGRAM total) by mouth 2 (two) times daily as needed for anxiety. 60 tablet 0   losartan  (COZAAR ) 25 MG tablet Take 1 tablet (25 mg total) by mouth daily. 90 tablet 0   methylPREDNISolone  (MEDROL  DOSEPAK) 4 MG TBPK tablet Take as package instructions. 1 each 0   metoprolol  succinate (TOPROL -XL) 25 MG 24 hr tablet Take 1.5 tablets (37.5 mg total) by mouth 2 (two) times daily. 90 tablet 0   omeprazole  (PRILOSEC) 20 MG capsule Take 1 capsule (20 mg total) by mouth daily. 30 capsule 2   ondansetron  (ZOFRAN ) 4 MG tablet Take 1 tablet (4 mg total) by mouth every 8 (eight) hours as needed for nausea or vomiting. 20 tablet 0   rosuvastatin  (CRESTOR ) 10 MG tablet Take 1 tablet (10 mg total) by mouth 2 (two) times daily. 180 tablet 0   UNABLE TO FIND Med Name: lemon-ginger honey stomach settle     No facility-administered medications  prior to visit.    Allergies  Allergen Reactions   Sulfa Antibiotics Rash, Dermatitis and Nausea Only    Other Reaction(s): Not available   Polyethylene Glycol 3350  Other (See Comments)    Numbness in legs   Codeine Nausea Only    Other Reaction(s): Not available   Penicillins Rash    AS A CHILD  Other Reaction(s): Not available    ROS Review of Systems  Constitutional:  Positive for appetite change. Negative for chills and fever.  HENT:  Negative for congestion, sinus pressure, sinus pain and sore throat.   Eyes:  Negative for pain and discharge.  Respiratory:  Negative for cough and shortness of breath.   Cardiovascular:  Negative for chest pain and palpitations.  Gastrointestinal:  Positive for abdominal pain, constipation and nausea. Negative for diarrhea and vomiting.  Endocrine: Negative for polydipsia and polyuria.  Genitourinary:  Negative for dysuria and hematuria.  Musculoskeletal:  Negative for neck pain and neck stiffness.  Skin:  Negative for rash.  Neurological:  Negative for dizziness and weakness.  Psychiatric/Behavioral:  Negative for agitation and behavioral problems.       Objective:    Physical Exam Vitals reviewed.  Constitutional:      General: She is not in acute distress.    Appearance: She is not diaphoretic.  HENT:     Head: Normocephalic and atraumatic.     Nose: Nose normal.     Mouth/Throat:     Mouth: Mucous membranes are moist.  Eyes:     General: No scleral icterus.    Extraocular Movements: Extraocular movements intact.  Cardiovascular:     Rate and Rhythm: Normal rate and regular rhythm.     Heart sounds: Normal heart sounds. No murmur heard. Pulmonary:     Breath sounds: Normal  breath sounds. No wheezing or rales.  Abdominal:     Palpations: Abdomen is soft.     Tenderness: There is no abdominal tenderness.  Musculoskeletal:     Right lower leg: No edema.     Left lower leg: No edema.  Skin:    General: Skin is warm.      Findings: No rash.  Neurological:     General: No focal deficit present.     Mental Status: She is alert and oriented to person, place, and time.     Cranial Nerves: No cranial nerve deficit.     Sensory: No sensory deficit.     Motor: No weakness.  Psychiatric:        Mood and Affect: Mood normal.        Behavior: Behavior normal.     BP 128/68 (BP Location: Right Arm)   Pulse 61   Ht 5' 4 (1.626 m)   Wt 125 lb 9.6 oz (57 kg)   SpO2 94%   BMI 21.56 kg/m  Wt Readings from Last 3 Encounters:  08/10/24 120 lb 12.8 oz (54.8 kg)  08/09/24 120 lb 12.8 oz (54.8 kg)  08/07/24 120 lb (54.4 kg)    Lab Results  Component Value Date   TSH 2.630 08/03/2024   Lab Results  Component Value Date   WBC 6.0 03/16/2024   HGB 13.0 03/16/2024   HCT 38.9 03/16/2024   MCV 100.0 03/16/2024   PLT 268 03/16/2024   Lab Results  Component Value Date   NA 144 01/03/2024   K 3.7 01/03/2024   CO2 17 (L) 01/03/2024   GLUCOSE 74 01/03/2024   BUN 46 (H) 01/03/2024   CREATININE 1.56 (H) 01/03/2024   BILITOT 0.2 01/03/2024   ALKPHOS 61 01/03/2024   AST 8 01/03/2024   ALT 13 01/03/2024   PROT 7.4 01/03/2024   ALBUMIN 4.5 01/03/2024   CALCIUM  9.6 01/03/2024   ANIONGAP 11 12/19/2021   EGFR 34 (L) 01/03/2024   Lab Results  Component Value Date   CHOL 140 01/03/2024   Lab Results  Component Value Date   HDL 42 01/03/2024   Lab Results  Component Value Date   LDLCALC 79 01/03/2024   Lab Results  Component Value Date   TRIG 103 01/03/2024   Lab Results  Component Value Date   CHOLHDL 3.3 01/03/2024   Lab Results  Component Value Date   HGBA1C 6.1 (H) 01/03/2024      Assessment & Plan:   Problem List Items Addressed This Visit       Cardiovascular and Mediastinum   Essential hypertension - Primary (Chronic)   BP Readings from Last 1 Encounters:  05/22/24 128/68   Well-controlled with amlodipine  5 mg QD, losartan  25 mg QD and metoprolol  37.5 mg BID Counseled for  compliance with the medications Advised DASH diet and ambulate as tolerated       Relevant Medications   amLODipine  (NORVASC ) 5 MG tablet   rosuvastatin  (CRESTOR ) 20 MG tablet     Digestive   Chronic gastritis without bleeding   Epigastric discomfort could be related to gastritis Restart omeprazole  20 mg once daily Zofran  as needed for nausea Maintain adequate hydration Avoid hot and spicy food        Genitourinary   CKD stage 3b, GFR 30-44 ml/min (HCC)   Last BMP reviewed On losartan  Maintain adequate hydration Avoid nephrotoxic agents        Other   History of TIA (transient ischemic  attack) (Chronic)   Recent episode of right UE numbness and tingling with facial numbness could be due to TIA CT head and MRI brain reviewed Continue aspirin and statin Referred to neurology      Relevant Orders   Ambulatory referral to Neurology   Hospital discharge follow-up   Hospital chart reviewed, including discharge summary Medications reconciled and reviewed with the patient in detail      Elevated TSH   TSH was elevated during recent hospitalization Recheck TSH and free T4 after 6 weeks      Relevant Orders   TSH + free T4 (Completed)   Other Visit Diagnoses       Mixed hyperlipidemia       Relevant Medications   amLODipine  (NORVASC ) 5 MG tablet   rosuvastatin  (CRESTOR ) 20 MG tablet       Meds ordered this encounter  Medications   amLODipine  (NORVASC ) 5 MG tablet    Sig: Take 1 tablet (5 mg total) by mouth daily.    Dispense:  90 tablet    Refill:  3   DISCONTD: losartan  (COZAAR ) 25 MG tablet    Sig: Take 1 tablet (25 mg total) by mouth daily.    Dispense:  90 tablet    Refill:  3   DISCONTD: omeprazole  (PRILOSEC) 20 MG capsule    Sig: Take 1 capsule (20 mg total) by mouth daily.    Dispense:  30 capsule    Refill:  2   rosuvastatin  (CRESTOR ) 20 MG tablet    Sig: Take 1 tablet (20 mg total) by mouth daily.    Dispense:  90 tablet    Refill:  1     Follow-up: Return in about 6 months (around 11/22/2024) for HTN and GERD.    Sandra MARLA Blanch, MD

## 2024-05-22 NOTE — Progress Notes (Unsigned)
 Established Patient Office Visit  Subjective:  Patient ID: Sandra Henry, female    DOB: 09-Nov-1945  Age: 78 y.o. MRN: [id number]  CC:  Chief Complaint  Patient presents with   Follow-up    Hospital f/u. States she is also due for her f/u visit.     HPI Sandra Henry is a 78 y.o. female with past medical history of HTN, HLD, glaucoma who presents for follow-up after recent hospitalization.  She presented with right sided arm heaviness and tingling as well as lip tingling on the top and bottom, both sides. Symptoms at the time of ER visit had resolved.  Otherwise there has been no change in status. Patient has been taking medication as prescribed and there has been no recent change in medication or diet. There has been no recent illness/hospitalizations, travel or sick contacts.  Teleneuro was consulted. Medical admission was requested for further workup and management of TIA/ subacute thalamic CVA. CT head with concern for multiple thalamic infarcts of indeterminate age, but MRI shows no acute infarct, negative for acute finding of any kind. She was discharged on 05/08/24 with continuation of aspirin and statin.  Past Medical History:  Diagnosis Date   Dental bridge present    upper   Dental crowns present    Dizziness and giddiness    Glaucoma    Hypercalcemia    Hypertension    under control with med., has been on med. x 4 yr.   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   index, middle and ring fingers    Past Surgical History:  Procedure Laterality Date   BREAST BIOPSY Left    CATARACT EXTRACTION W/ INTRAOCULAR LENS IMPLANT Bilateral    COLONOSCOPY N/A 01/30/2021   Procedure: COLONOSCOPY;  Surgeon: Golda Claudis PENNER, MD;  [facility];  Service: Endoscopy;  Laterality: N/A;  am   TRIGGER FINGER RELEASE Right 02/20/2014   Procedure: RELEASE A-1 PULLEY RIGHT INDEX,  MIDDLE AND  RING FINGERS ;  Surgeon: Arley JONELLE Curia, MD;  Location: [facility];  Service: Orthopedics;  Laterality: Right;   TUBAL LIGATION     laparoscopic    Family History  Problem Relation Age of Onset   Stroke Mother    Migraines Mother    Stroke Father    Heart attack Brother    Stroke Sister     Social History   Socioeconomic History   Marital status: Married    Spouse name: Not on file   Number of children: Not on file   Years of education: Not on file   Highest education level: Not on file  Occupational History   Occupation: medical receptionist    Employer: [employer]  Tobacco Use   Smoking status: Never   Smokeless tobacco: Never  Vaping Use   Vaping status: Never Used  Substance and Sexual Activity   Alcohol use: No   Drug use: No   Sexual activity: Not on file  Other Topics Concern   Not on file  Social History Narrative   Lives at home w/ her husband   Left-handed   Caffeine: occasional soft drink   Social Drivers of Corporate investment banker Strain: Low Risk  (05/08/2024)   Received from [facility]   Overall Financial Resource Strain (CARDIA)    How hard is it for you to pay for the very  basics like food, housing, medical care, and heating?: Not hard at all  Food Insecurity: No Food Insecurity (05/08/2024)   Received from [facility]   Hunger Vital Sign    Within the past 12 months, you worried that your food would run out before you got the money to buy more.: Never true    Within the past 12 months, the food you bought just didn't last and you didn't have money to get more.: Never true  Transportation Needs: No Transportation Needs (05/08/2024)   Received from [facility] - Transportation    Lack of Transportation (Medical): No    Lack of Transportation (Non-Medical): No  Physical Activity: Not on file  Stress: Not on file  Social Connections: Unknown (04/30/2023)   Received from [facility]   Social Network     Social Network: Not on file  Intimate Partner Violence: Not At Risk (07/19/2023)   Received from [facility]   Humiliation, Afraid, Rape, and Kick questionnaire    Within the last year, have you been afraid of your partner or ex-partner?: No    Within the last year, have you been humiliated or emotionally abused in other ways by your partner or ex-partner?: No    Within the last year, have you been kicked, hit, slapped, or otherwise physically hurt by your partner or ex-partner?: No    Within the last year, have you been raped or forced to have any kind of sexual activity by your partner or ex-partner?: No    Outpatient Medications Prior to Visit  Medication Sig Dispense Refill   amLODipine  (NORVASC ) 10 MG tablet Take 10 mg by mouth daily. (Patient taking differently: Take 5 mg by mouth daily.)     Ascorbic Acid (VITAMIN C) 1000 MG tablet Take 1,000 mg by mouth daily.     aspirin 81 MG tablet Take 81 mg by mouth daily.     Calcium -Magnesium-Vitamin D  600-40-500 MG-MG-UNIT TB24 Take 1 tablet by mouth 2 (two) times daily.     cholecalciferol (VITAMIN D ) 400 UNITS TABS tablet Take 400 Units by mouth 2 (two) times daily.     estradiol (ESTRACE) 0.1 MG/GM vaginal cream Place vaginally.     Garlic 10 MG CAPS Take 10 mg by mouth daily.     Glucosamine-Chondroitin 500-400 MG CAPS Take 2 tablets by mouth daily.     LORazepam  (ATIVAN ) 1 MG tablet take 0.5 tablets (0.5 MILLIGRAM total) by mouth 2 (two) times daily as needed for anxiety. 60 tablet 0   losartan  (COZAAR ) 25 MG tablet Take 1 tablet (25 mg total) by mouth daily. 90 tablet 0   Meclizine HCl 25 MG CHEW Chew 25 mg by mouth.     methylPREDNISolone  (MEDROL  DOSEPAK) 4 MG TBPK tablet Take as package instructions. 1 each 0   metoprolol  succinate (TOPROL -XL) 25 MG 24 hr tablet Take 1.5 tablets (37.5 mg total) by mouth 2 (two) times daily. 90 tablet 0   Multiple Vitamin (MULTIVITAMIN) tablet Take 1 tablet by mouth daily.      Netarsudil-Latanoprost (ROCKLATAN) 0.02-0.005 % SOLN Place 1 drop into both eyes at bedtime.     Omega-3 Fatty Acids (FISH OIL) 1000 MG CAPS Take 1,000 mg by mouth daily.     omeprazole  (PRILOSEC) 20 MG capsule Take 1 capsule (20 mg total) by mouth daily. 30 capsule 2   ondansetron  (ZOFRAN ) 4 MG tablet Take 1 tablet (4 mg total) by mouth every 8 (eight) hours as needed  for nausea or vomiting. 20 tablet 0   rosuvastatin  (CRESTOR ) 10 MG tablet Take 1 tablet (10 mg total) by mouth 2 (two) times daily. 180 tablet 0   timolol  (TIMOPTIC ) 0.5 % ophthalmic solution Place 1 drop into both eyes every morning. 10 mL 0   UNABLE TO FIND Med Name: lemon-ginger honey stomach settle     No facility-administered medications prior to visit.    Allergies  Allergen Reactions   Codeine Nausea Only   Penicillins Rash    AS A CHILD   Sulfa Antibiotics Rash    ROS Review of Systems    Objective:    Physical Exam  BP 128/68 (BP Location: Right Arm)   Pulse 61   Ht 5' 4 (1.626 m)   Wt 125 lb 9.6 oz (57 kg)   SpO2 94%   BMI 21.56 kg/m  Wt Readings from Last 3 Encounters:  05/22/24 125 lb 9.6 oz (57 kg)  04/24/24 123 lb (55.8 kg)  03/16/24 124 lb (56.2 kg)    Lab Results  Component Value Date   TSH 3.070 01/03/2024   Lab Results  Component Value Date   WBC 6.0 03/16/2024   HGB 13.0 03/16/2024   HCT 38.9 03/16/2024   MCV 100.0 03/16/2024   PLT 268 03/16/2024   Lab Results  Component Value Date   NA 144 01/03/2024   K 3.7 01/03/2024   CO2 17 (L) 01/03/2024   GLUCOSE 74 01/03/2024   BUN 46 (H) 01/03/2024   CREATININE 1.56 (H) 01/03/2024   BILITOT 0.2 01/03/2024   ALKPHOS 61 01/03/2024   AST 8 01/03/2024   ALT 13 01/03/2024   PROT 7.4 01/03/2024   ALBUMIN 4.5 01/03/2024   CALCIUM  9.6 01/03/2024   ANIONGAP 11 12/19/2021   EGFR 34 (L) 01/03/2024   Lab Results  Component Value Date   CHOL 140 01/03/2024   Lab Results  Component Value Date   HDL 42 01/03/2024   Lab Results   Component Value Date   LDLCALC 79 01/03/2024   Lab Results  Component Value Date   TRIG 103 01/03/2024   Lab Results  Component Value Date   CHOLHDL 3.3 01/03/2024   Lab Results  Component Value Date   HGBA1C 6.1 (H) 01/03/2024      Assessment & Plan:   Problem List Items Addressed This Visit   None   No orders of the defined types were placed in this encounter.   Follow-up: No follow-ups on file.    Suzzane MARLA Blanch, MD

## 2024-05-22 NOTE — Patient Instructions (Addendum)
 Please schedule Medicare Annual Wellness Visit.  Please take CoQ10 - 100 mg once daily.  Please continue to take other medications as prescribed.  Please continue to follow low salt diet and ambulate as tolerated.

## 2024-05-23 ENCOUNTER — Other Ambulatory Visit: Payer: Self-pay

## 2024-05-23 NOTE — Telephone Encounter (Signed)
 Copied from CRM #[id number]. Topic: Clinical - Medication Refill >> May 23, 2024  9:28 AM Wess RAMAN wrote: Medication: LORazepam  (ATIVAN ) 1 MG tablet   Has the patient contacted their pharmacy? Yes (Agent: If no, request that the patient contact the pharmacy for the refill. If patient does not wish to contact the pharmacy document the reason why and proceed with request.) (Agent: If yes, when and what did the pharmacy advise?)  This is the patient's preferred pharmacy:   [facility] - [place], [place] - [address] [place] [address] Phone: [phone] Fax: [phone]  Is this the correct pharmacy for this prescription? Yes If no, delete pharmacy and type the correct one.   Has the prescription been filled recently? Yes  Is the patient out of the medication? No  Has the patient been seen for an appointment in the last year OR does the patient have an upcoming appointment? Yes  Can we respond through MyChart? Yes  Agent: Please be advised that Rx refills may take up to 3 business days. We ask that you follow-up with your pharmacy.

## 2024-05-24 ENCOUNTER — Telehealth: Payer: Self-pay

## 2024-05-24 NOTE — Assessment & Plan Note (Signed)
 Last BMP reviewed On losartan  Maintain adequate hydration Avoid nephrotoxic agents

## 2024-05-24 NOTE — Assessment & Plan Note (Signed)
 TSH was elevated during recent hospitalization Recheck TSH and free T4 after 6 weeks

## 2024-05-24 NOTE — Assessment & Plan Note (Signed)
 Hospital chart reviewed, including discharge summary Medications reconciled and reviewed with the patient in detail

## 2024-05-24 NOTE — Assessment & Plan Note (Signed)
 Epigastric discomfort could be related to gastritis Restart omeprazole  20 mg once daily Zofran  as needed for nausea Maintain adequate hydration Avoid hot and spicy food

## 2024-05-24 NOTE — Telephone Encounter (Signed)
 Patient called back, advised referral has been placed.Patient verbalized understanding.

## 2024-05-24 NOTE — Assessment & Plan Note (Addendum)
 Recent episode of right UE numbness and tingling with facial numbness could be due to TIA CT head and MRI brain reviewed Continue aspirin and statin Referred to neurology

## 2024-05-24 NOTE — Telephone Encounter (Signed)
 Copied from CRM [id number]. Topic: Referral - Question >> May 24, 2024 11:55 AM Marylynn H wrote: Reason for CRM: Patient was seen in clinic on August 11th, wants to know if Dr. Tobie can refer her to a neurologist given her family history of strokes. Patient wants to be referred to [facility] in [place]. Main :  [phone], FAX : [phone]  Please advise patient if can be done # [phone], patient works on Tuesdays/Wednesdays so may not be able to answer.

## 2024-05-24 NOTE — Assessment & Plan Note (Addendum)
 BP Readings from Last 1 Encounters:  05/22/24 128/68   Well-controlled with amlodipine  5 mg QD, losartan  25 mg QD and metoprolol  37.5 mg BID Counseled for compliance with the medications Advised DASH diet and ambulate as tolerated

## 2024-05-30 NOTE — Telephone Encounter (Unsigned)
 Copied from CRM [id number]. Topic: Referral - Question >> May 24, 2024 11:55 AM Marylynn H wrote: Reason for CRM: Patient was seen in clinic on August 11th, wants to know if Dr. Tobie can refer her to a neurologist given her family history of strokes. Patient wants to be referred to [facility] in [place]. Main :  [phone], FAX : [phone]  Please advise patient if can be done # [phone], patient works on Tuesdays/Wednesdays so may not be able to answer.

## 2024-06-02 ENCOUNTER — Other Ambulatory Visit (HOSPITAL_COMMUNITY): Payer: Self-pay | Admitting: Internal Medicine

## 2024-06-02 DIAGNOSIS — Z1231 Encounter for screening mammogram for malignant neoplasm of breast: Secondary | ICD-10-CM

## 2024-06-05 ENCOUNTER — Ambulatory Visit (INDEPENDENT_AMBULATORY_CARE_PROVIDER_SITE_OTHER): Admitting: Gastroenterology

## 2024-06-05 ENCOUNTER — Encounter (INDEPENDENT_AMBULATORY_CARE_PROVIDER_SITE_OTHER): Payer: Self-pay | Admitting: Gastroenterology

## 2024-06-05 VITALS — BP 152/68 | HR 56 | Temp 97.7°F | Ht 64.0 in | Wt 121.7 lb

## 2024-06-05 DIAGNOSIS — R630 Anorexia: Secondary | ICD-10-CM

## 2024-06-05 DIAGNOSIS — K5904 Chronic idiopathic constipation: Secondary | ICD-10-CM

## 2024-06-05 DIAGNOSIS — R1319 Other dysphagia: Secondary | ICD-10-CM | POA: Diagnosis not present

## 2024-06-05 DIAGNOSIS — K5900 Constipation, unspecified: Secondary | ICD-10-CM

## 2024-06-05 MED ORDER — POLYETHYLENE GLYCOL 3350 17 G PO PACK: 17.0000 g | PACK | Freq: Two times a day (BID) | ORAL | 0 refills | Status: DC

## 2024-06-05 MED ORDER — PSYLLIUM 58.6 % PO PACK: 1.0000 | PACK | Freq: Two times a day (BID) | ORAL | 2 refills | Status: DC

## 2024-06-05 MED ORDER — PANTOPRAZOLE SODIUM 40 MG PO TBEC: 40.0000 mg | DELAYED_RELEASE_TABLET | Freq: Every day | ORAL | 3 refills | Status: DC

## 2024-06-05 NOTE — Patient Instructions (Signed)
 It was very nice to meet you today, as dicussed with will plan for the following :  1) Ensure adequate fluid intake: Aim for 8 glasses of water  daily. Follow a high fiber diet: Include foods such as dates, prunes, pears, and kiwi. Take Miralax  twice a day for the first week, then reduce to once daily thereafter. Use Metamucil twice a day.   2) protonix  40mg , 30 min before breakfast  3) CT Abdomen and Pelvis   4) Upper endoscopy

## 2024-06-05 NOTE — Progress Notes (Signed)
 Sandra Henry , M.D. Gastroenterology & Hepatology [facility] [address] [place], [place] [address] Primary Care Physician: Sandra Suzzane POUR, MD [address] [place] [address]  Chief Complaint: Nausea, decreased appetite, constipation and fatigue, dysphagia  History of Present Illness: Sandra Henry is a 78 y.o. female with HTN, HLD, glaucoma who presents for evaluation of Nausea, decreased appetite, constipation and fatigue  Patient was admitted last month for strokelike symptoms where CT head with concern for multiple thalamic infarcts of indeterminate age but MRI shows no acute infarct, negative for acute finding of any kind .MRI, Echo (both negative)   Patient reports since her hospitalization she has been nauseous with significantly decreased appetite.  Patient having a bowel movement every 2 to 3 days which would alternate between hard stool pebble-like and liquid stool.  Patient also reports solid food dysphagia as if food getting stuck in middle of her chest  Labs from 03/2024 hemoglobin 13 creatinine 1.56 normal liver enzymes Last ZHI:wnwz Last Colonoscopy:2022  - Diverticulosis in the sigmoid colon. - External hemorrhoids.  No further colonoscopy recommended  FHx: neg for any gastrointestinal/liver disease, no malignancies Social: neg smoking, alcohol or illicit drug use Surgical: no abdominal surgeries  Past Medical History: Past Medical History:  Diagnosis Date   Dental bridge present    upper   Dental crowns present    Dizziness and giddiness    Glaucoma    Hypercalcemia    Hypertension    under control with med., has been on med. x 4 yr.   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   index, middle and ring fingers    Past Surgical History: Past Surgical History:  Procedure Laterality Date   BREAST BIOPSY Left     CATARACT EXTRACTION W/ INTRAOCULAR LENS IMPLANT Bilateral    COLONOSCOPY N/A 01/30/2021   Procedure: COLONOSCOPY;  Surgeon: Golda Claudis PENNER, MD;  [facility];  Service: Endoscopy;  Laterality: N/A;  am   TRIGGER FINGER RELEASE Right 02/20/2014   Procedure: RELEASE A-1 PULLEY RIGHT INDEX,  MIDDLE AND RING FINGERS ;  Surgeon: Arley JONELLE Curia, MD;  Location: [facility];  Service: Orthopedics;  Laterality: Right;   TUBAL LIGATION     laparoscopic    Family History: Family History  Problem Relation Age of Onset   Stroke Mother    Migraines Mother    Stroke Father    Heart attack Brother    Stroke Sister     Social History: Social History   Tobacco Use  Smoking Status Never  Smokeless Tobacco Never   Social History   Substance and Sexual Activity  Alcohol Use No   Social History   Substance and Sexual Activity  Drug Use No    Allergies: Allergies  Allergen Reactions   Codeine Nausea Only   Penicillins Rash    AS A CHILD   Sulfa Antibiotics Rash    Medications: Current Outpatient Medications  Medication Sig Dispense Refill   amLODipine  (NORVASC ) 5 MG tablet Take 1 tablet (5 mg total) by mouth daily. 90 tablet 3   Ascorbic Acid (VITAMIN C) 1000 MG tablet Take 1,000 mg by mouth daily.     aspirin 81 MG tablet Take 81 mg by mouth daily.     Calcium -Magnesium-Vitamin D  600-40-500 MG-MG-UNIT TB24 Take 1 tablet by mouth 2 (two) times daily.  cholecalciferol (VITAMIN D ) 400 UNITS TABS tablet Take 400 Units by mouth 2 (two) times daily.     estradiol (ESTRACE) 0.1 MG/GM vaginal cream Place vaginally.     Garlic 10 MG CAPS Take 10 mg by mouth daily.     Glucosamine-Chondroitin 500-400 MG CAPS Take 2 tablets by mouth daily.     LORazepam  (ATIVAN ) 1 MG tablet take 0.5 tablets (0.5 milligram total) by mouth 2 (two) times daily as needed for anxiety. 60 tablet 0   losartan  (COZAAR ) 25 MG tablet Take 1 tablet (25 mg total) by mouth daily. 90 tablet 3    Meclizine HCl 25 MG CHEW Chew 25 mg by mouth.     metoprolol  succinate (TOPROL -XL) 25 MG 24 hr tablet Take 1.5 tablets (37.5 mg total) by mouth 2 (two) times daily. 90 tablet 0   Multiple Vitamin (MULTIVITAMIN) tablet Take 1 tablet by mouth daily.     Netarsudil-Latanoprost (ROCKLATAN) 0.02-0.005 % SOLN Place 1 drop into both eyes at bedtime.     Omega-3 Fatty Acids (FISH OIL) 1000 MG CAPS Take 1,000 mg by mouth daily.     omeprazole  (PRILOSEC) 20 MG capsule Take 1 capsule (20 mg total) by mouth daily. 30 capsule 2   ondansetron  (ZOFRAN ) 4 MG tablet Take 1 tablet (4 mg total) by mouth every 8 (eight) hours as needed for nausea or vomiting. 20 tablet 0   rosuvastatin  (CRESTOR ) 20 MG tablet Take 1 tablet (20 mg total) by mouth daily. 90 tablet 1   timolol  (TIMOPTIC ) 0.5 % ophthalmic solution Place 1 drop into both eyes every morning. 10 mL 0   No current facility-administered medications for this visit.    Review of Systems: GENERAL: negative for malaise, night sweats HEENT: No changes in hearing or vision, no nose bleeds or other nasal problems. NECK: Negative for lumps, goiter, pain and significant neck swelling RESPIRATORY: Negative for cough, wheezing CARDIOVASCULAR: Negative for chest pain, leg swelling, palpitations, orthopnea GI: SEE HPI MUSCULOSKELETAL: Negative for joint pain or swelling, back pain, and muscle pain. SKIN: Negative for lesions, rash HEMATOLOGY Negative for prolonged bleeding, bruising easily, and swollen nodes. ENDOCRINE: Negative for cold or heat intolerance, polyuria, polydipsia and goiter. NEURO: negative for tremor, gait imbalance, syncope and seizures. The remainder of the review of systems is noncontributory.   Physical Exam: BP (!) 152/68   Pulse (!) 56   Temp 97.7 F (36.5 C)   Ht 5' 4 (1.626 m)   Wt 121 lb 11.2 oz (55.2 kg)   BMI 20.89 kg/m  GENERAL: The patient is AO x3, in no acute distress. HEENT: Head is normocephalic and atraumatic. EOMI are  intact. Mouth is well hydrated and without lesions. NECK: Supple. No masses LUNGS: Clear to auscultation. No presence of rhonchi/wheezing/rales. Adequate chest expansion HEART: RRR, normal s1 and s2. ABDOMEN: Soft, nontender, no guarding, no peritoneal signs, and nondistended. BS +. No masses.  Imaging/Labs: as above     Latest Ref Rng & Units 03/16/2024    6:40 AM 01/03/2024    7:54 AM 12/19/2021    2:47 PM  CBC  WBC 4.0 - 10.5 K/uL 6.0  8.2  7.6   Hemoglobin 12.0 - 15.0 g/dL 86.9  88.5  86.2   Hematocrit 36.0 - 46.0 % 38.9  34.9  40.9   Platelets 150 - 400 K/uL 268  302  321    No results found for: IRON, TIBC, FERRITIN  I personally reviewed and interpreted the available labs, imaging and endoscopic  files.  Impression and Plan: Sandra Henry is a 78 y.o. female with HTN, HLD, glaucoma who presents for evaluation of Nausea, decreased appetite, constipation and fatigue  #Dysphagia #Loss of Appetite/Nausea #Constipation   Patient has solid food dysphagia which could be GERD, esophageal web ring or stricture but need to rule out malignancy given recent alarm symptom of  loss of appetite and new onset dyspepsia  As per ACG guideline with patient above age 30 with new onset dyspepsia upper endoscopy is the next  Schedule upper endoscopy with dilation  Also obtain CT abdomen pelvis to evaluate for any pancreatic biliary lesion  Patient symptoms can be also explained by underlying constipation which can lead to abdominal pain nausea and also attributed to loss of appetite and severe cases  Ensure adequate fluid intake: Aim for 8 glasses of water  daily. Follow a high fiber diet: Include foods such as dates, prunes, pears, and kiwi. Take Miralax  twice a day for the first week, then reduce to once daily thereafter. Use Metamucil twice a day.  protonix  40mg , 30 min before breakfast  I thoroughly discussed with the patient the procedure, including the risks involved.  Patient understands what the procedure involves including the benefits and any risks. Patient understands alternatives to the proposed procedure. Risks including (but not limited to) bleeding, tearing of the lining (perforation), rupture of adjacent organs, problems with heart and lung function, infection, and medication reactions. A small percentage of complications may require surgery, hospitalization, repeat endoscopic procedure, and/or transfusion.  Patient understood and agreed.    All questions were answered.      Yi Falletta Faizan Chanoch Mccleery, MD Gastroenterology and Hepatology [facility] Gastroenterology   This chart has been completed using [facility] Dictation software, and while attempts have been made to ensure accuracy , certain words and phrases may not be transcribed as intended

## 2024-06-06 ENCOUNTER — Other Ambulatory Visit: Payer: Self-pay | Admitting: Internal Medicine

## 2024-06-06 ENCOUNTER — Encounter: Payer: Self-pay | Admitting: *Deleted

## 2024-06-06 ENCOUNTER — Telehealth (INDEPENDENT_AMBULATORY_CARE_PROVIDER_SITE_OTHER): Payer: Self-pay | Admitting: Gastroenterology

## 2024-06-06 DIAGNOSIS — K2950 Unspecified chronic gastritis without bleeding: Secondary | ICD-10-CM

## 2024-06-06 MED ORDER — ONDANSETRON HCL 4 MG PO TABS: 4.0000 mg | ORAL_TABLET | Freq: Three times a day (TID) | ORAL | 1 refills | Status: DC | PRN

## 2024-06-06 NOTE — Telephone Encounter (Signed)
 Pt left voicemail stating that she had a death in her church (pt husband in a pastor) and is wanting to know if she can wait to start meds next week. Pt also wanted to confirm that she can eat and drink normal before CT.  Returned call to pt. Pt advised that she can start her meds next week if need be. Pt also stated that she had to move her CT scan to 06/13/24 and wanted to know if she should do the meds before. Advised pt that if she wanted to wait until after having the CT to start her meds she can. Confirmed with pt that she can eat and drink normal before CT scan.

## 2024-06-06 NOTE — Telephone Encounter (Signed)
 Copied from CRM [id number]. Topic: Clinical - Medication Refill >> Jun 06, 2024  2:44 PM Debby BROCKS wrote: Medication: ondansetron  (ZOFRAN ) 4 MG tablet  Has the patient contacted their pharmacy? No (Agent: If no, request that the patient contact the pharmacy for the refill. If patient does not wish to contact the pharmacy document the reason why and proceed with request.) (Agent: If yes, when and what did the pharmacy advise?)  This is the patient's preferred pharmacy:  [facility] - [place], [place] - [address] [place] [address] Phone: [phone] Fax: [phone]  Is this the correct pharmacy for this prescription? Yes If no, delete pharmacy and type the correct one.   Has the prescription been filled recently? No  Is the patient out of the medication? Yes  Has the patient been seen for an appointment in the last year OR does the patient have an upcoming appointment? Yes  Can we respond through MyChart? Yes  Agent: Please be advised that Rx refills may take up to 3 business days. We ask that you follow-up with your pharmacy.

## 2024-06-07 ENCOUNTER — Ambulatory Visit (INDEPENDENT_AMBULATORY_CARE_PROVIDER_SITE_OTHER): Admitting: Gastroenterology

## 2024-06-07 ENCOUNTER — Telehealth: Payer: Self-pay

## 2024-06-07 ENCOUNTER — Ambulatory Visit (HOSPITAL_COMMUNITY)

## 2024-06-07 ENCOUNTER — Other Ambulatory Visit (HOSPITAL_COMMUNITY): Payer: Self-pay

## 2024-06-07 NOTE — Telephone Encounter (Signed)
 Returned pts call gave her clarity on her questions.

## 2024-06-07 NOTE — Telephone Encounter (Signed)
 Copied from CRM [id number]. Topic: Clinical - Lab/Test Results >> Jun 07, 2024  3:10 PM Emylou G wrote: Reason for CRM: Please call patient.. needs clarity between the annual wellness, phys, and 6 mo f/u.. is she doing bloodwork during the [facility] 1027 appt

## 2024-06-08 DIAGNOSIS — B351 Tinea unguium: Secondary | ICD-10-CM | POA: Diagnosis not present

## 2024-06-08 DIAGNOSIS — H401133 Primary open-angle glaucoma, bilateral, severe stage: Secondary | ICD-10-CM | POA: Diagnosis not present

## 2024-06-08 DIAGNOSIS — M79674 Pain in right toe(s): Secondary | ICD-10-CM | POA: Diagnosis not present

## 2024-06-08 DIAGNOSIS — L84 Corns and callosities: Secondary | ICD-10-CM | POA: Diagnosis not present

## 2024-06-13 ENCOUNTER — Ambulatory Visit (HOSPITAL_COMMUNITY)
Admission: RE | Admit: 2024-06-13 | Discharge: 2024-06-13 | Disposition: A | Discharge status: Home / Self Care | Source: Ambulatory Visit | Attending: Gastroenterology | Admitting: Gastroenterology

## 2024-06-13 DIAGNOSIS — R630 Anorexia: Secondary | ICD-10-CM | POA: Diagnosis not present

## 2024-06-13 DIAGNOSIS — K449 Diaphragmatic hernia without obstruction or gangrene: Secondary | ICD-10-CM | POA: Insufficient documentation

## 2024-06-13 DIAGNOSIS — K429 Umbilical hernia without obstruction or gangrene: Secondary | ICD-10-CM | POA: Diagnosis not present

## 2024-06-13 DIAGNOSIS — R110 Nausea: Secondary | ICD-10-CM | POA: Diagnosis not present

## 2024-06-13 DIAGNOSIS — K5730 Diverticulosis of large intestine without perforation or abscess without bleeding: Secondary | ICD-10-CM | POA: Diagnosis not present

## 2024-06-13 DIAGNOSIS — R109 Unspecified abdominal pain: Secondary | ICD-10-CM | POA: Diagnosis not present

## 2024-06-13 MED ORDER — IOHEXOL 300 MG/ML  SOLN
100.0000 mL | Freq: Once | INTRAMUSCULAR | Status: AC | PRN
  Administered 2024-06-13: 100 mL via INTRAVENOUS

## 2024-06-16 ENCOUNTER — Telehealth (INDEPENDENT_AMBULATORY_CARE_PROVIDER_SITE_OTHER): Payer: Self-pay

## 2024-06-16 NOTE — Telephone Encounter (Signed)
 Patient calling for CT scan results that were preformed on 06/13/2024. I made the patient aware the Doctor had not yet read the report, he would be back in the office next week. But it did not look like anything worrisome on the scan. Patient states understanding and will await Dr. Orion recommendations.   IMPRESSION: No acute findings.   Colonic diverticulosis, without radiographic evidence of diverticulitis.   Tiny hiatal hernia.   Small fat-containing umbilical and paraumbilical hernias.     Electronically Signed   By: Norleen DELENA Kil M.D.   On: 06/14/2024 10:26

## 2024-06-19 ENCOUNTER — Ambulatory Visit (INDEPENDENT_AMBULATORY_CARE_PROVIDER_SITE_OTHER): Payer: Self-pay | Admitting: Gastroenterology

## 2024-06-19 NOTE — Telephone Encounter (Signed)
 Called pt to schedule egd, scheduled for 07/04/2024 at 2:15pm. Instruction sent through [facility].

## 2024-06-20 ENCOUNTER — Telehealth (INDEPENDENT_AMBULATORY_CARE_PROVIDER_SITE_OTHER): Payer: Self-pay

## 2024-06-20 NOTE — Telephone Encounter (Signed)
 Patient called in wanting to cancel procedure, states she's been through too much this year. Also wanted me to state that she means no disrespect by canceling her appointment. Patient also is wants to know if she needs to keep her appointment for 07/03/2024. Please advise.

## 2024-06-21 NOTE — Telephone Encounter (Signed)
 Patient left vm stating that her and her husband talked about her getting the upper endoscopy and she has decided that she wants to get the EGD done. Patient wants us  to give her a call back once the October schedule is open. Pt requests a Thursday or Friday procedure.

## 2024-06-25 DIAGNOSIS — R5383 Other fatigue: Secondary | ICD-10-CM | POA: Diagnosis not present

## 2024-06-25 DIAGNOSIS — Z8249 Family history of ischemic heart disease and other diseases of the circulatory system: Secondary | ICD-10-CM | POA: Diagnosis not present

## 2024-06-25 DIAGNOSIS — Z885 Allergy status to narcotic agent status: Secondary | ICD-10-CM | POA: Diagnosis not present

## 2024-06-25 DIAGNOSIS — E876 Hypokalemia: Secondary | ICD-10-CM | POA: Diagnosis not present

## 2024-06-25 DIAGNOSIS — R531 Weakness: Secondary | ICD-10-CM | POA: Diagnosis not present

## 2024-06-25 DIAGNOSIS — R7989 Other specified abnormal findings of blood chemistry: Secondary | ICD-10-CM | POA: Diagnosis not present

## 2024-06-25 DIAGNOSIS — R4182 Altered mental status, unspecified: Secondary | ICD-10-CM | POA: Diagnosis not present

## 2024-06-25 DIAGNOSIS — E785 Hyperlipidemia, unspecified: Secondary | ICD-10-CM | POA: Diagnosis not present

## 2024-06-25 DIAGNOSIS — R627 Adult failure to thrive: Secondary | ICD-10-CM | POA: Diagnosis not present

## 2024-06-25 DIAGNOSIS — K5900 Constipation, unspecified: Secondary | ICD-10-CM | POA: Diagnosis not present

## 2024-06-25 DIAGNOSIS — N390 Urinary tract infection, site not specified: Secondary | ICD-10-CM | POA: Diagnosis not present

## 2024-06-25 DIAGNOSIS — I129 Hypertensive chronic kidney disease with stage 1 through stage 4 chronic kidney disease, or unspecified chronic kidney disease: Secondary | ICD-10-CM | POA: Diagnosis not present

## 2024-06-25 DIAGNOSIS — Z79899 Other long term (current) drug therapy: Secondary | ICD-10-CM | POA: Diagnosis not present

## 2024-06-25 DIAGNOSIS — Z87891 Personal history of nicotine dependence: Secondary | ICD-10-CM | POA: Diagnosis not present

## 2024-06-25 DIAGNOSIS — Z792 Long term (current) use of antibiotics: Secondary | ICD-10-CM | POA: Diagnosis not present

## 2024-06-25 DIAGNOSIS — N189 Chronic kidney disease, unspecified: Secondary | ICD-10-CM | POA: Diagnosis not present

## 2024-06-25 DIAGNOSIS — E46 Unspecified protein-calorie malnutrition: Secondary | ICD-10-CM | POA: Diagnosis not present

## 2024-06-25 DIAGNOSIS — I080 Rheumatic disorders of both mitral and aortic valves: Secondary | ICD-10-CM | POA: Diagnosis not present

## 2024-06-25 DIAGNOSIS — R110 Nausea: Secondary | ICD-10-CM | POA: Diagnosis not present

## 2024-06-25 DIAGNOSIS — Z20822 Contact with and (suspected) exposure to covid-19: Secondary | ICD-10-CM | POA: Diagnosis not present

## 2024-06-25 DIAGNOSIS — G9389 Other specified disorders of brain: Secondary | ICD-10-CM | POA: Diagnosis not present

## 2024-06-25 DIAGNOSIS — Z882 Allergy status to sulfonamides status: Secondary | ICD-10-CM | POA: Diagnosis not present

## 2024-06-25 DIAGNOSIS — I471 Supraventricular tachycardia, unspecified: Secondary | ICD-10-CM | POA: Diagnosis not present

## 2024-06-25 DIAGNOSIS — M79601 Pain in right arm: Secondary | ICD-10-CM | POA: Diagnosis not present

## 2024-06-25 DIAGNOSIS — Z880 Allergy status to penicillin: Secondary | ICD-10-CM | POA: Diagnosis not present

## 2024-06-25 DIAGNOSIS — I10 Essential (primary) hypertension: Secondary | ICD-10-CM | POA: Diagnosis not present

## 2024-06-25 NOTE — Care Plan (Signed)
 Shift Summary Urinalysis revealed elevated WBCs and bacteria, and cefTRIAXone  in dextrose  (premix) was administered and then stopped in the Img [facility] department.  Sodium chloride  0.9% with KCl 20 mEq was given in the Img Ct [facility] department.  Bisacodyl was administered for bowel management.  Cardiac rhythm remained normal sinus rhythm, confirmed by ECG and telemetry monitoring.  No falls or injuries occurred, and safety interventions were maintained throughout the shift.   Absence of Infection Signs and Symptoms: Temperature remained within normal limits and rapid viral panel was negative; urinalysis showed elevated WBCs and bacteria, and cefTRIAXone  in dextrose  (premix) was administered and then stopped in the Img Ct [facility] department.   Absence of Fall and Fall-Related Injury: Fall reduction program and bed alarm were maintained, with hourly visual checks and safety interventions in place; no falls or injuries occurred during the shift.   Electrolyte Balance: Sodium chloride  0.9% with KCl 20 mEq was administered in the Img Ct [facility] department, and no abnormal electrolyte-related symptoms were documented.   Effective Bowel Elimination: Bisacodyl was administered and no liquid stools were reported in the past 24 hours.   Normalized Cardiac Rhythm: Cardiac rhythm remained normal sinus rhythm throughout the shift, with telemetry alarms set and no significant changes noted on ECG.

## 2024-06-25 NOTE — ACP (Advance Care Planning) (Signed)
 ADVANCE CARE PLANNING NOTE  Discussion Date:  June 25, 2024  Patient has decisional capacity:  Yes  Patient has selected a Health Care Decision-Maker if loses capacity: Yes  Health Care Decision Maker as of 06/25/2024 Dynisha Due husband [phone]   Discussion Participants: Pt, husband, son  Communication of Medical Status/Prognosis:  Yes  Communication of Treatment Goals/Options:  Discussed treatment plan, code status  Treatment Decisions:  Pt and her family agree with the treatment plan, full code     I spent 18 minutes providing voluntary advance care planning services for this patient.

## 2024-06-25 NOTE — ED Provider Notes (Signed)
 Emergency Department Provider Note    ED Clinical Impression   Final diagnoses:  Weakness (Primary)  Elevated troponin  Acute UTI    ED Assessment/Plan    Condition: Stable Disposition: Admit  This chart has been completed using Dragon Medical Dictation software, and while attempts have been made to ensure accuracy, certain words and phrases may not be transcribed as intended.   History   Chief Complaint  Patient presents with  . Weakness    Generalized weakness    HPI  Sandra Henry is a 78 y.o. female  who presents today to the  emergency department complaining of generalized weakness last night/this morning.  Patient states that she took Metamucil and MiraLAX  which was prescribed by her gastroenterologist for her stomach issues.  Shortly thereafter, she felt so weak that she could not move anything.  She says she is feeling somewhat better now.  She notes that this is the second occurrence.  When she took the same combination the last time, she felt the same way but not as bad as this time.  She denies any abdominal pain.  She has had some nausea for the past several weeks and some constipation for the last few days and that is why she has been seen by gastroenterology.  She denies any visual or speech changes.    Allergies: is allergic to codeine, penicillins, sulfa (sulfonamide antibiotics), and sulfasalazine. Medications: has a current medication list which includes the following long-term medication(s): amlodipine , losartan , metoprolol  succinate, and rosuvastatin . PMHx:  has a past medical history of Hyperlipidemia, Hypertension, and Moderate stage glaucoma. PSHx:  has a past surgical history that includes Hand surgery (Right); Eye surgery; TUBES TIED; and Spine surgery (09/07/2018). SocHx:  reports that she has never smoked. She has never used smokeless tobacco. She reports that she  does not drink alcohol and does not use drugs. Allergies, Medications, Medical, Surgical, and Social History were reviewed as documented above.   Social Drivers of Health with Concerns   Physical Activity: Not on file  Stress: Not on file  Social Connections: Unknown (04/30/2023)   Received from [facility]   Social Network   . Social Network: Not on file     Review Of Systems  Review of Systems  Constitutional:  Negative for fever.  HENT:  Negative for congestion.   Respiratory:  Negative for chest tightness and shortness of breath.   Cardiovascular:  Negative for chest pain.  Gastrointestinal:  Positive for constipation and nausea. Negative for abdominal pain.  Skin:  Negative for color change.  Neurological:  Positive for weakness.  Psychiatric/Behavioral:  Negative for behavioral problems.   All other systems reviewed and are negative.   Physical Exam   BP 137/57   Pulse 80   Temp 36.7 C (98 F) (Skin)   Resp 16   Ht 162.6 cm (5' 4)   Wt 56.2 kg (124 lb)   SpO2 96%   BMI 21.28 kg/m   Physical Exam Vitals and nursing note reviewed.  Constitutional:      General: She is not in acute distress. HENT:     Head: Normocephalic.  Eyes:     Conjunctiva/sclera: Conjunctivae normal.  Cardiovascular:     Rate and Rhythm: Regular rhythm.     Pulses: Normal pulses.  Heart sounds: Normal heart sounds.  Pulmonary:     Effort: No respiratory distress.     Breath sounds: Normal breath sounds.  Abdominal:     General: There is no distension.     Tenderness: There is no abdominal tenderness. There is no guarding or rebound.     Comments: Abdomen is soft, nontender, normoactive bowel sounds.  Musculoskeletal:        General: No deformity.  Skin:    General: Skin is warm.     Capillary Refill: Capillary refill takes 2 to 3 seconds.     Comments: Normal cap refill.  Neurological:     General: No focal deficit present.     Mental Status: She is oriented to  person, place, and time.     Cranial Nerves: No cranial nerve deficit.     Sensory: No sensory deficit.     Motor: No weakness.     Comments: There is no motor, sensory or cerebellar deficits.  Nonfocal neurologic exam.  GCS is 15.  Cranial nerves grossly intact.  NIH stroke score is 0.  NIHSS Level of Consciousness: Alert (0) Response to Question: Both Correct (0) Response to Commands: Both Correct (0) Pupillary Response:Both Reactive (0)   Gaze: Normal (0) Visual Fields: No Vision Loss (0)  Facial Paresis: Normal (0) Left Arm Motor: No drift (0) Right Arm Motor:No drift (0) Left Leg Motor: No drift (0)  Right Leg Motor: No drift (0)  Limb Ataxia:Absent (0) Sensory: Absent (0) Best Language:No aphasia   (0)   Dysarthria: Normal articulation   (0)  Extinction/inattention: No neglect (0) Total: 0    Psychiatric:        Mood and Affect: Mood normal.     ED Course  Medical Decision Making Differential diagnosis includes medication side effect versus dehydration versus electrolyte abnormality versus TIA.  Will check basic labs.  EKG shows normal sinus rhythm at 81 bpm.  Normal axis.  Normal intervals.  No acute injury pattern.  8:45 AM Patient is doing well.  Asymptomatic.  She did not have a great response to enema.  Troponins are slightly elevated.  She denies any chest pain or shortness of breath  Repeat EKG shows normal sinus rhythm at 82 bpm.  Normal axis.  Normal intervals.  Normal EKG.  9:20 AM Patient's care discussed with Dr. Sedalia, cardiology.  I discussed elevated troponin in light of no chest pain, shortness of breath or other cardiopulmonary symptoms.  He recommends admission here for further workup including likely echo tomorrow.  He feels this is likely demand ischemia there is no indication for heart catheterization at this time.  Consequently, there is no indication for transfer.  9:47 AM Patient is stable.  Urinalysis consistent with UTI.  That could be  the cause of her weakness.  Patient does not meet sepsis criteria.  Patient will be admitted.  Patient's care discussed with Dr. Feliz, hospitalist.  Will admit.  Problems Addressed: Acute UTI: acute illness or injury that poses a threat to life or bodily functions Elevated troponin: acute illness or injury that poses a threat to life or bodily functions Weakness: acute illness or injury that poses a threat to life or bodily functions  Amount and/or Complexity of Data Reviewed Labs: ordered. Decision-making details documented in ED Course. Radiology: ordered. Decision-making details documented in ED Course. ECG/medicine tests: ordered and independent interpretation performed. Decision-making details documented in ED Course. Discussion of management or test interpretation with external provider(s): Patient's care  discussed with hospitalist.     Procedures   Encounter Date: 06/25/24  ECG 12 Lead  Result Value   EKG Systolic BP    EKG Diastolic BP    EKG Ventricular Rate 82   EKG Atrial Rate 82   EKG P-R Interval 190   EKG QRS Duration 82   EKG Q-T Interval 360   EKG QTC Calculation 420   EKG Calculated P Axis 81   EKG Calculated R Axis -12   EKG Calculated T Axis 58   QTC Fredericia 399   Narrative   Normal sinus rhythm Normal ECG When compared with ECG of 25-Jun-2024 05:56, No significant change was found Confirmed by Cherie Searle ([id number]) on 06/25/2024 8:47:17 AM     ED Results Results for orders placed or performed during the hospital encounter of 06/25/24  Comprehensive Metabolic Panel  Result Value Ref Range   Sodium 141 135 - 145 mmol/L   Potassium 3.3 (L) 3.5 - 5.0 mmol/L   Chloride 106 98 - 107 mmol/L   CO2 21.6 21.0 - 32.0 mmol/L   Anion Gap 13 (H) 3 - 11 mmol/L   BUN 21 (H) 8 - 20 mg/dL   Creatinine 8.99 9.39 - 1.10 mg/dL   BUN/Creatinine Ratio 21    eGFR CKD-EPI (2021) Female 58 (L) >=60 mL/min/1.66m2   Glucose 124 70 - 179 mg/dL   Calcium  9.5 8.5 -  10.1 mg/dL   Albumin 3.3 (L) 3.5 - 5.0 g/dL   Total Protein 7.7 6.0 - 8.0 g/dL   Total Bilirubin 0.4 0.3 - 1.2 mg/dL   AST 11 (L) 15 - 40 U/L   ALT 13 12 - 78 U/L   Alkaline Phosphatase 59 46 - 116 U/L  PT-INR  Result Value Ref Range   PT 11.1 9.9 - 12.6 sec   INR 1.00 Undefined  hsTroponin I (single, no delta)  Result Value Ref Range   hsTroponin I 78 (HH) <=34 ng/L  Magnesium  Result Value Ref Range   Magnesium 2.0 1.6 - 2.6 mg/dL  hsTroponin I (serial 7-3Y CONTINUATION w/ delta)  Result Value Ref Range   hsTroponin I 102 (HH) <=34 ng/L   delta hsTroponin I 24 (HH) <=7 ng/L  ECG 12 Lead  Result Value Ref Range   EKG Systolic BP  mmHg   EKG Diastolic BP  mmHg   EKG Ventricular Rate 81 BPM   EKG Atrial Rate 81 BPM   EKG P-R Interval 200 ms   EKG QRS Duration 86 ms   EKG Q-T Interval 352 ms   EKG QTC Calculation 408 ms   EKG Calculated P Axis 75 degrees   EKG Calculated R Axis -2 degrees   EKG Calculated T Axis 68 degrees   QTC Fredericia 388 ms  ECG 12 Lead  Result Value Ref Range   EKG Systolic BP  mmHg   EKG Diastolic BP  mmHg   EKG Ventricular Rate 82 BPM   EKG Atrial Rate 82 BPM   EKG P-R Interval 190 ms   EKG QRS Duration 82 ms   EKG Q-T Interval 360 ms   EKG QTC Calculation 420 ms   EKG Calculated P Axis 81 degrees   EKG Calculated R Axis -12 degrees   EKG Calculated T Axis 58 degrees   QTC Fredericia 399 ms  CBC w/ Differential  Result Value Ref Range   WBC 13.0 (H) 4.0 - 10.5 10*9/L   RBC 3.95 3.80 - 5.10 10*12/L  HGB 12.4 11.5 - 15.0 g/dL   HCT 64.1 65.9 - 55.9 %   MCV 90.6 80.0 - 98.0 fL   MCH 31.4 27.0 - 34.0 pg   MCHC 34.6 32.0 - 36.0 g/dL   RDW 87.3 88.4 - 85.4 %   MPV 9.6 7.4 - 10.4 fL   Platelet 319 140 - 415 10*9/L   Neutrophils % 84.7 %   Lymphocytes % 5.6 %   Monocytes % 7.1 %   Eosinophils % 1.9 %   Basophils % 0.4 %   Absolute Neutrophils 11.0 (H) 1.8 - 7.8 10*9/L   Absolute Lymphocytes 0.7 0.7 - 4.5 10*9/L   Absolute Monocytes  0.9 0.1 - 1.0 10*9/L   Absolute Eosinophils 0.2 0.0 - 0.4 10*9/L   Absolute Basophils 0.1 0.0 - 0.2 10*9/L  Urinalysis with Microscopy with Culture Reflex  Result Value Ref Range   Color, UA Colorless    Clarity, UA Clear Clear   Specific Gravity, UA 1.009 (L) 1.010 - 1.025   pH, UA 7.0 5.0 - 8.0   Leukocyte Esterase, UA Large (A) Negative   Nitrite, UA Negative Negative   Protein, UA Negative Negative   Glucose, UA Negative Negative, Trace   Ketones, UA Negative Negative   Urobilinogen, UA <2.0 mg/dL <7.9 mg/dL   Bilirubin, UA Negative Negative   Blood, UA Negative Negative   RBC, UA 0 0 - 3 /HPF   WBC, UA 19 (H) 0 - 3 /HPF   Squam Epithel, UA 6 0 - 10 /HPF   Bacteria, UA Many (A) None Seen /HPF   WBC Clumps None Seen None Seen /HPF   Hyphal Yeast None Seen None Seen /HPF   Yeast, UA None Seen None Seen /HPF   ECG 12 Lead Result Date: 06/25/2024 Normal sinus rhythm Septal infarct , age undetermined Abnormal ECG When compared with ECG of 08-May-2024 02:29, premature atrial complexes are no longer present Confirmed by Cherie Searle ([id number]) on 06/25/2024 8:47:21 AM  ECG 12 Lead Result Date: 06/25/2024 Normal sinus rhythm Normal ECG When compared with ECG of 25-Jun-2024 05:56, No significant change was found Confirmed by Cherie Searle ([id number]) on 06/25/2024 8:47:17 AM  CT head without contrast Result Date: 06/25/2024 Exam:  CT Head without Contrast  History:  Weakness  Technique: Routine brain CT without IV contrast. AEC (automated exposure control) and/or manual techniques such as size-specific kV and mAs are employed where appropriate to reduce radiation exposure for all CT exams.  Comparison:  CT dated 05/08/2024.  Findings:   BRAIN:  No CT evidence of acute infarction, hemorrhage, edema, mass or mass effect. Mild periventricular white matter hypodensities likely indicative of chronic microvascular ischemic changes. Ventricles and basilar cisterns are unremarkable.  SOFT TISSUES:   Negative. CALVARIUM:  Negative. No fracture. SINUSES AND MASTOIDS:  No mucosal thickening or fluid.    No acute intracranial abnormality.  Signed (Electronic Signature): 06/25/2024 6:41 AM Signed By: Anselm Scripture, MD  XR Abdomen 2 Views and Chest 1 View Result Date: 06/25/2024 Exam: Abdomen 3 Views  History:  Nausea  Technique:  Three views.  Comparison:  Radiographs dated 05/08/2024.  Findings:  The bowel gas pattern is normal. Specifically, there is no evidence of obstruction nor is there pneumoperitoneum. Moderate to large stool burden noted throughout the colon. No pathologic calcifications are detected. The regional bones are normal for age.  The frontal view of the chest reveals normal heart size and pulmonary vascularity.   The lungs are clear.  Mediastinal contours  are normal. There is no pleural effusion or pneumothorax. No acute or aggressive osseous abnormalities. Appearance of chondrocalcinosis noted at the right acromioclavicular joint.    1.    Moderate to large stool burden noted throughout the colon. 2.    No acute cardiopulmonary abnormality.  Signed (Electronic Signature): 06/25/2024 6:39 AM Signed By: Anselm Scripture, MD   Medications Administered:  Medications  levoFLOXacin (LEVAQUIN) 750 mg/150 mL IVPB 750 mg (has no administration in time range)  prochlorperazine (COMPAZINE) injection 10 mg (10 mg Intravenous Given 06/25/24 0602)    Discharge Medications (Medications Prescribed during this  ED visit and Patient's Home Medications) :    Your Medication List     ASK your doctor about these medications    amlodipine  5 MG tablet Commonly known as: NORVASC  Take 1 tablet (5 mg total) by mouth daily.   ascorbic acid (vitamin C) 1000 MG tablet Commonly known as: VITAMIN C Take 1 tablet (1,000 mg total) by mouth.   aspirin-calcium  carbonate 81 mg-300 mg calcium (777 mg) Tab Take 81 mg by mouth.   Betimol  0.25 % ophthalmic solution Generic drug: timolol  Apply to eye.    cholecalciferol (vitamin D3-10 mcg (400 unit)) 10 mcg (400 unit) Tab Take 1 tablet (10 mcg total) by mouth.   CITRACAL-D3 SLOW RELEASE 600 mg-12.5 mcg (500 unit) Tber Generic drug: calcium  carb, citrate-vit D3 Take 1 tablet by mouth.   DAILY FIBER (PSYLLIUM-ASPART) 3.4 gram packet Generic drug: psyllium Take 1 packet by mouth two (2) times a day.   estradiol 0.01 % (0.1 mg/gram) vaginal cream Commonly known as: ESTRACE Insert 2 g into the vagina Two (2) times a week.   garlic 1,000 mg Cap Take 1 capsule by mouth.   glucosamine-chondroitin 500-400 mg Cap Take 1 tablet by mouth.   LORazepam  1 MG tablet Commonly known as: ATIVAN  lorazepam  1 mg tablet   losartan  25 MG tablet Commonly known as: COZAAR  1 tablet (25 mg total).   LOTEMAX 0.5 % ophthalmic suspension Generic drug: loteprednol Lotemax 0.5 % eye drops,suspension   metoPROLOL  succinate 25 MG 24 hr tablet Commonly known as: Toprol -XL Take 1.5 tablets (37.5 mg total) by mouth two (2) times a day.   multivitamin per tablet Take 1 tablet by mouth.   omega-3 acid ethyl esters 1 gram capsule Commonly known as: LOVAZA Take by mouth.   ondansetron  4 MG tablet Commonly known as: ZOFRAN  Take 1 tablet (4 mg total) by mouth.   pantoprazole  40 MG tablet Commonly known as: Protonix  Take 1 tablet (40 mg total) by mouth daily. TAKE 1 TABLET (40 MG TOTAL) BY MOUTH DAILY.   polyethylene glycol 17 gram packet Commonly known as: MIRALAX  Take 17 g by mouth two (2) times a day.   ROCKLATAN 0.02-0.005 % Drop Generic drug: netarsudil-latanoprost Apply to eye.   rosuvastatin  20 MG tablet Commonly known as: CRESTOR  Take 1 tablet (20 mg total) by mouth.          Cherie Ardeen Hanger, MD 06/25/24 [phone]

## 2024-06-26 ENCOUNTER — Telehealth: Payer: Self-pay

## 2024-06-26 ENCOUNTER — Ambulatory Visit (HOSPITAL_COMMUNITY)

## 2024-06-26 DIAGNOSIS — E876 Hypokalemia: Secondary | ICD-10-CM | POA: Diagnosis not present

## 2024-06-26 DIAGNOSIS — Z136 Encounter for screening for cardiovascular disorders: Secondary | ICD-10-CM | POA: Diagnosis not present

## 2024-06-26 DIAGNOSIS — N390 Urinary tract infection, site not specified: Secondary | ICD-10-CM | POA: Diagnosis not present

## 2024-06-26 DIAGNOSIS — R531 Weakness: Secondary | ICD-10-CM | POA: Diagnosis not present

## 2024-06-26 DIAGNOSIS — R4182 Altered mental status, unspecified: Secondary | ICD-10-CM | POA: Diagnosis not present

## 2024-06-26 DIAGNOSIS — R7989 Other specified abnormal findings of blood chemistry: Secondary | ICD-10-CM | POA: Diagnosis not present

## 2024-06-26 DIAGNOSIS — R627 Adult failure to thrive: Secondary | ICD-10-CM | POA: Diagnosis not present

## 2024-06-26 DIAGNOSIS — I10 Essential (primary) hypertension: Secondary | ICD-10-CM | POA: Diagnosis not present

## 2024-06-26 DIAGNOSIS — E785 Hyperlipidemia, unspecified: Secondary | ICD-10-CM | POA: Diagnosis not present

## 2024-06-26 DIAGNOSIS — K5900 Constipation, unspecified: Secondary | ICD-10-CM | POA: Diagnosis not present

## 2024-06-26 DIAGNOSIS — G9389 Other specified disorders of brain: Secondary | ICD-10-CM | POA: Diagnosis not present

## 2024-06-26 NOTE — Telephone Encounter (Signed)
Left vm approving orders.

## 2024-06-26 NOTE — Nursing Note (Signed)
 Medicare Outpatient Observation Notice Patient name:Sandra Henry Patient [phone]  You're a hospital outpatient receiving observation services. You are not an inpatient because: Your admitting provider does not expect your required hospital care to cross two midnights, Your admitting provider believes your Medicare Advantage (Part C) plan would want you to be in observation, and Based on your hospital course, your care provider will determine if a change to inpatient status becomes warranted.  Being an outpatient may affect what you pay in a hospital:  When you're a hospital   outpatient, your observation stay is covered under Medicare Part B.  For Part B services, you generally pay: A copayment for each outpatient hospital service you get. Part B copayments may vary by type of service. 20% of the Medicare-approved amount for most doctor services, after the Part B deductible.  Observation services may affect coverage and payment of your care after you leave the hospital:     If you need skilled nursing facility (SNF) care after you leave the hospital, Medicare Part A will only cover SNF care if you've had a 3-day minimum, medically necessary, inpatient hospital stay for a related illness or injury. An inpatient hospital stay begins the day the hospital admits you as an inpatient based on a doctor's order and doesn't include the day you're discharged.  If you have Medicaid, a Medicare Advantage plan or other health plan, Medicaid or the plan may have different rules for SNF coverage after you leave the hospital. Check with Medicaid or your plan.  NOTE: Medicare Part A generally doesn't cover outpatient hospital services, like an observation stay. However, Part A will generally cover medically necessary inpatient services if the hospital admits you as an inpatient based on a doctor's order. In most cases, you'll pay a one-time deductible for all of your inpatient hospital services  for the first 60 days you're in a hospital.  If you have any questions about your observation services, ask the hospital staff member giving you this notice or the doctor providing your hospital care. You can also ask to speak with someone from the hospital's utilization or discharge planning department. You can also call 1-800-MEDICARE ([phone]).  TT[employer] users should call  [phone].   Form CMS 10611-MOON                     Expiration 09/10/2024 OMB approval [id number]     Your costs for medications: Generally, prescription and over-the-counter drugs, including "self-administered drugs," you get in a hospital outpatient setting (like an emergency department) aren't covered by Part B. "Self- administered drugs" are drugs you'd normally take on your own. For safety reasons, many hospitals don't allow you to take medications brought from home. If you have a Medicare prescription drug plan (Part D), your plan may help you pay for these drugs. You'll likely need to pay out-of- pocket for these drugs and submit a claim to your drug plan for a refund. Contact your drug plan for more information.   If you're enrolled in a Medicare Advantage plan (like an HMO or PPO) or other Medicare health plan (Part C), your costs and coverage may be different. Check with your plan to find out about coverage for outpatient observation services.  If you're a Qualified Medicare Beneficiary through your state Medicaid program, you can't be billed for Part A or Part B deductibles, coinsurance, and copayments.   Additional Information (Optional):  Your provider has made a good-faith decision to order observation care based  on the rules set forth by Medicare or your insurance plan. Providers must abide by all Medicare rules in order to care for Medicare patients:Medicare Part B does not cover self-administered drugs given in a hospital outpatient setting. The current policy of this hospital is to not bill you for  these drugs, Some Medicare programs do not require a 3-day inpatient stay to qualify for skilled nursing facility (SNF) care. We believe this may apply to you because: You are enrolled in a Medicare Advantage (Part C) plan, with coverage subject to your insurance plan's approval and Your provider is part of a Medicare innovative model whose rules do not require a 3-day inpatient stay to qualify for SNF care at certain SNFs , Some people may have Medicare as a secondary payer, may not have active or full Medicare benefits, or may have exhausted those benefits. The law still requires us  to give you this notice, but your costs and coverage may be different, Verbal explanation was given by hospital staff, and Beneficiary was not appropriate to receive verbal explanation, and good faith efforts to reach a beneficiary representative were unsuccessful.  This document delivered to you by: Augustin Cecil, RN   Please sign below to show you received and understand this notice.   ________________________________ Signature of Patient or Representative  September 15, 2025___________________ Date/Time  CMS does not discriminate in its programs and activities. To request this publication in alternative format, please call: 1-800-MEDICARE or email:[email or web address] (link: [email or web address]). (link: [email or web address])  Form CMS 10611-MOON         Expiration 09/10/2024  OMB approval [id number]

## 2024-06-26 NOTE — Care Plan (Signed)
 Shift Summary Metoprolol  succinate and docusate sodium were administered in the evening to address cardiac rhythm and bowel elimination goals. Sodium chloride  0.9 % with KCl 20 mEq infusion was stopped after completion early in the morning. Laboratory tests including CBC and comprehensive metabolic panel were drawn in the early morning hours, providing updated information on metabolic and hematologic status. Fall risk interventions were maintained throughout the shift, with no documented falls or injuries. Overall, interventions and monitoring were consistent with care goals and no acute adverse events were documented during the shift.  Absence of Fall and Fall-Related Injury: Fall risk interventions were consistently implemented throughout the shift, including regular toileting, hourly visual checks, bed alarms, and appropriate arm bands, with no documented falls or injuries.  Effective Bowel Elimination: Docusate sodium was administered during the shift to support bowel elimination; no documentation of bowel movement or elimination issues was provided.  Normalized Cardiac Rhythm: Cardiac rhythm remained normal sinus rhythm throughout the shift, and metoprolol  succinate was administered in the evening.

## 2024-06-26 NOTE — Telephone Encounter (Signed)
 Copied from CRM [id number]. Topic: Clinical - Home Health Verbal Orders >> Jun 26, 2024  2:29 PM Tiffini S wrote: Caller/Agency: [facility] hospital Callback Number: [phone] Service Requested: Outpatient Physical Therapy Frequency: Unknown   Any new concerns about the patient? No

## 2024-06-27 ENCOUNTER — Telehealth: Payer: Self-pay

## 2024-06-27 ENCOUNTER — Telehealth: Payer: Self-pay | Admitting: Internal Medicine

## 2024-06-27 NOTE — Telephone Encounter (Signed)
 Augustin called in returning a call to [place]. I called and spoke with Caelyn but she had me on hold for a long time and I had to let [place] go. Please call Augustin back to discuss further regarding Physical Therapy for the patient.

## 2024-06-27 NOTE — Telephone Encounter (Signed)
 Patient stated she was in the hospital and is following-up on getting a stress test ordered.

## 2024-06-27 NOTE — Telephone Encounter (Signed)
 Copied from CRM [id number]. Topic: General - Other >> Jun 27, 2024 11:27 AM Nathanel BROCKS wrote: Reason for CRM: pt called and stated that [facility] therapy office will be sending over a fax for her to have phsyical therapy. Please sign and return.  The physical therapy contact, Augustin Cecil.

## 2024-06-28 ENCOUNTER — Other Ambulatory Visit: Payer: Self-pay | Admitting: Internal Medicine

## 2024-06-28 ENCOUNTER — Telehealth: Payer: Self-pay | Admitting: *Deleted

## 2024-06-28 DIAGNOSIS — R5381 Other malaise: Secondary | ICD-10-CM

## 2024-06-28 DIAGNOSIS — R531 Weakness: Secondary | ICD-10-CM

## 2024-06-28 DIAGNOSIS — I10 Essential (primary) hypertension: Secondary | ICD-10-CM

## 2024-06-28 NOTE — Progress Notes (Signed)
   06/28/2024  Patient ID: Sandra Henry, female   DOB: 12/18/1945, 78 y.o.   MRN: [id number]  Pharmacy Quality Measure Review  This patient is appearing on a report for being at risk of failing the adherence measure for hypertension (ACEi/ARB) medications this calendar year.   Medication: losartan  25mg  - 06/12/24 90 day supply   Insurance report was not up to date. No action needed at this time.   Lang Sieve, PharmD, BCGP Clinical Pharmacist  [phone]

## 2024-06-28 NOTE — Addendum Note (Signed)
 Addended byBETHA TOBIE DOWNS on: 06/28/2024 12:03 PM   Modules accepted: Orders

## 2024-06-28 NOTE — Telephone Encounter (Signed)
 Pt called in. Has appt 9/22 with Dr. Cinderella. She has not had her procedure done yet. At first decided against it and then called and wants a Friday for procedure. She then stated she had a allergic reaction to the miralax . Dr. Cinderella, should we just leave appointment and can schedule for procedure once she see's you again?

## 2024-07-03 ENCOUNTER — Encounter (INDEPENDENT_AMBULATORY_CARE_PROVIDER_SITE_OTHER): Payer: Self-pay

## 2024-07-03 ENCOUNTER — Ambulatory Visit (INDEPENDENT_AMBULATORY_CARE_PROVIDER_SITE_OTHER): Admitting: Gastroenterology

## 2024-07-03 NOTE — Telephone Encounter (Signed)
 Patient had an appointment with me today and appears she cancelled it . I do recommend upper endoscopy as previously discussed

## 2024-07-03 NOTE — Telephone Encounter (Signed)
 Patient states [facility] went ahead and ordered stress test since it took awhile for a response from us  but she said she will let us  know if she decides to want to do it with us . Advised patient if she does this there please have them fax us  the results from stress test. Patient states they are calling her while on the phone with her to schedule with them.

## 2024-07-03 NOTE — Telephone Encounter (Signed)
 Called pt. She stated she wants to wait before she schedules anything at this point. Reports she will call back when ready.

## 2024-07-04 ENCOUNTER — Encounter (HOSPITAL_COMMUNITY): Payer: Self-pay

## 2024-07-04 ENCOUNTER — Inpatient Hospital Stay: Payer: Self-pay

## 2024-07-04 ENCOUNTER — Ambulatory Visit (HOSPITAL_COMMUNITY): Admit: 2024-07-04 | Admitting: Gastroenterology

## 2024-07-04 DIAGNOSIS — R7989 Other specified abnormal findings of blood chemistry: Secondary | ICD-10-CM | POA: Diagnosis not present

## 2024-07-04 DIAGNOSIS — I209 Angina pectoris, unspecified: Secondary | ICD-10-CM | POA: Diagnosis not present

## 2024-07-04 SURGERY — EGD (ESOPHAGOGASTRODUODENOSCOPY)
Anesthesia: Choice

## 2024-07-05 DIAGNOSIS — N189 Chronic kidney disease, unspecified: Secondary | ICD-10-CM | POA: Diagnosis not present

## 2024-07-05 DIAGNOSIS — D631 Anemia in chronic kidney disease: Secondary | ICD-10-CM | POA: Diagnosis not present

## 2024-07-05 DIAGNOSIS — E211 Secondary hyperparathyroidism, not elsewhere classified: Secondary | ICD-10-CM | POA: Diagnosis not present

## 2024-07-05 DIAGNOSIS — R809 Proteinuria, unspecified: Secondary | ICD-10-CM | POA: Diagnosis not present

## 2024-07-10 ENCOUNTER — Ambulatory Visit: Payer: Self-pay | Admitting: Internal Medicine

## 2024-07-10 ENCOUNTER — Encounter: Payer: Self-pay | Admitting: Internal Medicine

## 2024-07-10 VITALS — BP 136/70 | HR 70 | Ht 64.0 in | Wt 121.8 lb

## 2024-07-10 DIAGNOSIS — K5909 Other constipation: Secondary | ICD-10-CM | POA: Diagnosis not present

## 2024-07-10 DIAGNOSIS — Z09 Encounter for follow-up examination after completed treatment for conditions other than malignant neoplasm: Secondary | ICD-10-CM

## 2024-07-10 DIAGNOSIS — I471 Supraventricular tachycardia, unspecified: Secondary | ICD-10-CM

## 2024-07-10 DIAGNOSIS — I10 Essential (primary) hypertension: Secondary | ICD-10-CM | POA: Diagnosis not present

## 2024-07-10 NOTE — Progress Notes (Unsigned)
 Established Patient Office Visit  Subjective:  Patient ID: Sandra Henry, female    DOB: Jun 24, 1946  Age: 78 y.o. MRN: [id number]  CC:  Chief Complaint  Patient presents with   Follow-up    Hospital f/u , has bp readings. States some reading have been elevated.    HPI Sandra Henry is a 78 y.o. female with past medical history of HTN, HLD, glaucoma who presents for follow-up after recent hospitalization.  Pt presented to [facility] on 06/25/24 with profound generalized weakness, lethargy, constipation. Possible UTI with leukocytosis, abnormal UA. Admitted for IVF, electrolyte repletion, IV antibx, PT/OT eval. She did not have BM for 3 days prior to her admission.  She also had lack of appetite and poor p.o. intake for the last few weeks.  She was given laxatives in the hospital, including enema and Dulcolax suppository, after which she had 2 bowel movements.  She felt improvement of her fatigue after IV fluids, was discharged on 06/26/24.  She has chronic epigastric discomfort, nausea and lack of appetite.  She was given omeprazole  in 02/25 for possible chronic gastritis, but did not continue taking it. She was given Pantoprazole  for GI, which has improved her symptoms.  Denies melena or hematochezia.  HTN: BP is well-controlled today, but elevated at times at home.  She was told to take metoprolol  25 mg twice daily instead of 37.5 mg from recent hospitalization.  Patient denies headache, dizziness, chest pain, dyspnea or palpitations.   Past Medical History:  Diagnosis Date   Dental bridge present    upper   Dental crowns present    Dizziness and giddiness    Glaucoma    Hypercalcemia    Hypertension    under control with med., has been on med. x 4 yr.   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   index, middle and ring fingers    Past Surgical History:  Procedure Laterality Date    BREAST BIOPSY Left    CATARACT EXTRACTION W/ INTRAOCULAR LENS IMPLANT Bilateral    COLONOSCOPY N/A 01/30/2021   Procedure: COLONOSCOPY;  Surgeon: Golda Claudis PENNER, MD;  [facility];  Service: Endoscopy;  Laterality: N/A;  am   TRIGGER FINGER RELEASE Right 02/20/2014   Procedure: RELEASE A-1 PULLEY RIGHT INDEX,  MIDDLE AND RING FINGERS ;  Surgeon: Arley JONELLE Curia, MD;  Location: [facility];  Service: Orthopedics;  Laterality: Right;   TUBAL LIGATION     laparoscopic    Family History  Problem Relation Age of Onset   Stroke Mother    Migraines Mother    Stroke Father    Heart attack Brother    Stroke Sister     Social History   Socioeconomic History   Marital status: Married    Spouse name: Not on file   Number of children: Not on file   Years of education: Not on file   Highest education level: Not on file  Occupational History   Occupation: medical receptionist    Employer: [employer]  Tobacco Use   Smoking status: Never   Smokeless tobacco: Never  Vaping Use   Vaping status: Never Used  Substance and Sexual Activity   Alcohol use: No   Drug use: No   Sexual activity: Not on file  Other Topics Concern   Not on file  Social History Narrative   Lives  at home w/ her husband   Left-handed   Caffeine: occasional soft drink   Social Drivers of Corporate investment banker Strain: Low Risk  (06/25/2024)   Received from [facility]   Overall Financial Resource Strain (CARDIA)    How hard is it for you to pay for the very basics like food, housing, medical care, and heating?: Not hard at all  Food Insecurity: No Food Insecurity (06/25/2024)   Received from [facility]   Hunger Vital Sign    Within the past 12 months, you worried that your food would run out before you got the money to buy more.: Never true    Within the past 12 months, the food you bought just didn't last and you didn't have money to get more.: Never true  Transportation  Needs: No Transportation Needs (06/25/2024)   Received from [facility]   PRAPARE - Transportation    Lack of Transportation (Medical): No    Lack of Transportation (Non-Medical): No  Physical Activity: Inactive (06/25/2024)   Received from [facility]   Exercise Vital Sign    On average, how many days per week do you engage in moderate to strenuous exercise (like a brisk walk)?: 0 days    On average, how many minutes do you engage in exercise at this level?: 0 min  Stress: No Stress Concern Present (06/25/2024)   Received from [facility] - Occupational Stress Questionnaire    Do you feel stress - tense, restless, nervous, or anxious, or unable to sleep at night because your mind is troubled all the time - these days?: Only a little  Social Connections: Moderately Isolated (06/25/2024)   Received from [facility]   Social Connection and Isolation Panel    In a typical week, how many times do you talk on the phone with family, friends, or neighbors?: More than three times a week    How often do you get together with friends or relatives?: More than three times a week    How often do you attend church or religious services?: Never    Do you belong to any clubs or organizations such as church groups, unions, fraternal or athletic groups, or school groups?: No    How often do you attend meetings of the clubs or organizations you belong to?: Never    Are you married, widowed, divorced, separated, never married, or living with a partner?: Married  Intimate Partner Violence: Not At Risk (06/25/2024)   Received from [facility]   Humiliation, Afraid, Rape, and Kick questionnaire    Within the last year, have you been afraid of your partner or ex-partner?: No    Within the last year, have you been humiliated or emotionally abused in other ways by your partner or ex-partner?: No    Within the last year, have you been kicked, hit, slapped,  or otherwise physically hurt by your partner or ex-partner?: No    Within the last year, have you been raped or forced to have any kind of sexual activity by your partner or ex-partner?: No    Outpatient Medications Prior to Visit  Medication Sig Dispense Refill   amLODipine  (NORVASC ) 5 MG tablet Take 1 tablet (5 mg total) by mouth daily. 90 tablet 3   Ascorbic Acid (VITAMIN C) 1000 MG tablet Take 1,000 mg by mouth daily.     aspirin 81 MG tablet Take 81  mg by mouth daily.     Calcium -Magnesium-Vitamin D  600-40-500 MG-MG-UNIT TB24 Take 1 tablet by mouth 2 (two) times daily.     cholecalciferol (VITAMIN D ) 400 UNITS TABS tablet Take 400 Units by mouth 2 (two) times daily.     estradiol (ESTRACE) 0.1 MG/GM vaginal cream Place vaginally.     Garlic 10 MG CAPS Take 10 mg by mouth daily.     Glucosamine-Chondroitin 500-400 MG CAPS Take 2 tablets by mouth daily.     LINZESS 72 MCG capsule Take 72 mcg by mouth daily before breakfast.     losartan  (COZAAR ) 25 MG tablet Take 1 tablet (25 mg total) by mouth daily. 90 tablet 3   Meclizine HCl 25 MG CHEW Chew 25 mg by mouth.     metoprolol  succinate (TOPROL -XL) 25 MG 24 hr tablet Take 1.5 tablets (37.5 mg total) by mouth 2 (two) times daily. 90 tablet 0   Multiple Vitamin (MULTIVITAMIN) tablet Take 1 tablet by mouth daily.     Netarsudil-Latanoprost (ROCKLATAN) 0.02-0.005 % SOLN Place 1 drop into both eyes at bedtime.     Omega-3 Fatty Acids (FISH OIL) 1000 MG CAPS Take 1,000 mg by mouth daily.     omeprazole  (PRILOSEC) 20 MG capsule Take 1 capsule (20 mg total) by mouth daily. 30 capsule 2   ondansetron  (ZOFRAN ) 4 MG tablet Take 1 tablet (4 mg total) by mouth every 8 (eight) hours as needed for nausea or vomiting. 20 tablet 1   pantoprazole  (PROTONIX ) 40 MG tablet Take 1 tablet (40 mg total) by mouth daily. 60 tablet 3   psyllium (METAMUCIL) 58.6 % packet Take 1 packet by mouth 2 (two) times daily. 60 packet 2   rosuvastatin  (CRESTOR ) 20 MG tablet Take  1 tablet (20 mg total) by mouth daily. 90 tablet 1   timolol  (TIMOPTIC ) 0.5 % ophthalmic solution Place 1 drop into both eyes every morning. 10 mL 0   LORazepam  (ATIVAN ) 1 MG tablet take 0.5 tablets (0.5 milligram total) by mouth 2 (two) times daily as needed for anxiety. 60 tablet 0   polyethylene glycol (MIRALAX  / GLYCOLAX ) 17 g packet Take 17 g by mouth 2 (two) times daily. 180 packet 0   No facility-administered medications prior to visit.    Allergies  Allergen Reactions   Codeine Nausea Only   Penicillins Rash    AS A CHILD   Sulfa Antibiotics Rash    ROS Review of Systems  Constitutional:  Positive for appetite change and fatigue. Negative for chills and fever.  HENT:  Negative for congestion, sinus pressure, sinus pain and sore throat.   Eyes:  Negative for pain and discharge.  Respiratory:  Negative for cough and shortness of breath.   Cardiovascular:  Negative for chest pain and palpitations.  Gastrointestinal:  Positive for constipation and nausea. Negative for diarrhea and vomiting.  Endocrine: Negative for polydipsia and polyuria.  Genitourinary:  Negative for dysuria and hematuria.  Musculoskeletal:  Negative for neck pain and neck stiffness.  Skin:  Negative for rash.  Neurological:  Negative for dizziness and weakness.  Psychiatric/Behavioral:  Negative for agitation and behavioral problems.       Objective:    Physical Exam Vitals reviewed.  Constitutional:      General: She is not in acute distress.    Appearance: She is not diaphoretic.  HENT:     Head: Normocephalic and atraumatic.     Nose: Nose normal.     Mouth/Throat:  Mouth: Mucous membranes are moist.  Eyes:     General: No scleral icterus.    Extraocular Movements: Extraocular movements intact.  Cardiovascular:     Rate and Rhythm: Normal rate and regular rhythm.     Heart sounds: Normal heart sounds. No murmur heard. Pulmonary:     Breath sounds: Normal breath sounds. No wheezing or  rales.  Abdominal:     Palpations: Abdomen is soft.     Tenderness: There is no abdominal tenderness.  Musculoskeletal:     Right lower leg: No edema.     Left lower leg: No edema.  Skin:    General: Skin is warm.     Findings: No rash.  Neurological:     General: No focal deficit present.     Mental Status: She is alert and oriented to person, place, and time.     Sensory: No sensory deficit.     Motor: No weakness.  Psychiatric:        Mood and Affect: Mood normal.        Behavior: Behavior normal.     BP 136/70   Pulse 70   Ht 5' 4 (1.626 m)   Wt 121 lb 12.8 oz (55.2 kg)   SpO2 98%   BMI 20.91 kg/m  Wt Readings from Last 3 Encounters:  07/10/24 121 lb 12.8 oz (55.2 kg)  06/05/24 121 lb 11.2 oz (55.2 kg)  05/22/24 125 lb 9.6 oz (57 kg)    Lab Results  Component Value Date   TSH 3.070 01/03/2024   Lab Results  Component Value Date   WBC 6.0 03/16/2024   HGB 13.0 03/16/2024   HCT 38.9 03/16/2024   MCV 100.0 03/16/2024   PLT 268 03/16/2024   Lab Results  Component Value Date   NA 144 01/03/2024   K 3.7 01/03/2024   CO2 17 (L) 01/03/2024   GLUCOSE 74 01/03/2024   BUN 46 (H) 01/03/2024   CREATININE 1.56 (H) 01/03/2024   BILITOT 0.2 01/03/2024   ALKPHOS 61 01/03/2024   AST 8 01/03/2024   ALT 13 01/03/2024   PROT 7.4 01/03/2024   ALBUMIN 4.5 01/03/2024   CALCIUM  9.6 01/03/2024   ANIONGAP 11 12/19/2021   EGFR 34 (L) 01/03/2024   Lab Results  Component Value Date   CHOL 140 01/03/2024   Lab Results  Component Value Date   HDL 42 01/03/2024   Lab Results  Component Value Date   LDLCALC 79 01/03/2024   Lab Results  Component Value Date   TRIG 103 01/03/2024   Lab Results  Component Value Date   CHOLHDL 3.3 01/03/2024   Lab Results  Component Value Date   HGBA1C 6.1 (H) 01/03/2024      Assessment & Plan:   Problem List Items Addressed This Visit       Cardiovascular and Mediastinum   Essential hypertension - Primary (Chronic)    BP Readings from Last 1 Encounters:  07/10/24 136/70   Home readings elevated recently as she was told to decrease dose of metoprolol  to 25 mg BID during recent hospitalization Was Well-controlled with amlodipine  5 mg QD, losartan  25 mg QD and metoprolol  37.5 mg BID - advised to start taking Metoprolol  37.5 mg BID again Counseled for compliance with the medications Advised DASH diet and ambulate as tolerated      PSVT (paroxysmal supraventricular tachycardia) (Chronic)   Has intermittent palpitations, on metoprolol  Followed by cardiology        Digestive  Chronic constipation   Advised to continue MiraLAX  as needed for constipation Can take Senokot-S PRN Was given Linzess from recent hospitalization, but did not help adequate response from it Advised to maintain at least 64 ounces of fluid intake in a day and eat at regular intervals        Other   Hospital discharge follow-up   Hospital chart reviewed, including discharge summary Medications reconciled and reviewed with the patient in detail        No orders of the defined types were placed in this encounter.   Follow-up: Return if symptoms worsen or fail to improve.    Suzzane MARLA Blanch, MD

## 2024-07-10 NOTE — Patient Instructions (Addendum)
 Please take Senokot-S twice daily for constipation. Please continue taking Linzess as prescribed.  Please take Metoprolol  1.5 tablets twice daily.  Please continue to take medications as prescribed.  Please continue to follow low salt diet and ambulate as tolerated.

## 2024-07-11 ENCOUNTER — Other Ambulatory Visit: Payer: Self-pay | Admitting: Internal Medicine

## 2024-07-12 ENCOUNTER — Telehealth: Payer: Self-pay | Admitting: *Deleted

## 2024-07-12 ENCOUNTER — Telehealth: Payer: Self-pay

## 2024-07-12 NOTE — Telephone Encounter (Signed)
 Copied from CRM #[id number]. Topic: General - Other >> Jul 12, 2024  2:31 PM Santiya F wrote: Reason for CRM: Augustin with [facility] Case management is calling in requesting the referral coordinator to give her a call regarding this patient  [phone]

## 2024-07-12 NOTE — Telephone Encounter (Signed)
 Pt called in. She stated she was feeling better after being hospitalized. She needs to reschedule her procedure and her OV. Dr. Cinderella, should we see her 1st prior to rescheduling procedure?

## 2024-07-12 NOTE — Telephone Encounter (Signed)
 Yes since she had recently hospitalization , lets have her seen by any APP or myself

## 2024-07-13 ENCOUNTER — Ambulatory Visit (HOSPITAL_COMMUNITY)
Admission: RE | Admit: 2024-07-13 | Discharge: 2024-07-13 | Disposition: A | Discharge status: Home / Self Care | Source: Ambulatory Visit | Attending: Internal Medicine | Admitting: Internal Medicine

## 2024-07-13 DIAGNOSIS — E8722 Chronic metabolic acidosis: Secondary | ICD-10-CM | POA: Diagnosis not present

## 2024-07-13 DIAGNOSIS — R809 Proteinuria, unspecified: Secondary | ICD-10-CM | POA: Diagnosis not present

## 2024-07-13 DIAGNOSIS — I129 Hypertensive chronic kidney disease with stage 1 through stage 4 chronic kidney disease, or unspecified chronic kidney disease: Secondary | ICD-10-CM | POA: Diagnosis not present

## 2024-07-13 DIAGNOSIS — N1831 Chronic kidney disease, stage 3a: Secondary | ICD-10-CM | POA: Diagnosis not present

## 2024-07-13 DIAGNOSIS — Z1231 Encounter for screening mammogram for malignant neoplasm of breast: Secondary | ICD-10-CM | POA: Diagnosis not present

## 2024-07-13 NOTE — Telephone Encounter (Signed)
 Tried to call # and phone just rang - Phone did not give me an option to LM.

## 2024-07-14 ENCOUNTER — Other Ambulatory Visit: Payer: Self-pay | Admitting: Internal Medicine

## 2024-07-14 NOTE — Assessment & Plan Note (Addendum)
 BP Readings from Last 1 Encounters:  07/10/24 136/70   Home readings elevated recently as she was told to decrease dose of metoprolol  to 25 mg BID during recent hospitalization Was Well-controlled with amlodipine  5 mg QD, losartan  25 mg QD and metoprolol  37.5 mg BID - advised to start taking Metoprolol  37.5 mg BID again Counseled for compliance with the medications Advised DASH diet and ambulate as tolerated

## 2024-07-14 NOTE — Assessment & Plan Note (Addendum)
 Has intermittent palpitations, on metoprolol  Followed by cardiology

## 2024-07-14 NOTE — Assessment & Plan Note (Signed)
 Hospital chart reviewed, including discharge summary Medications reconciled and reviewed with the patient in detail

## 2024-07-14 NOTE — Assessment & Plan Note (Signed)
 Advised to continue MiraLAX  as needed for constipation Can take Senokot-S PRN Was given Linzess from recent hospitalization, but did not help adequate response from it Advised to maintain at least 64 ounces of fluid intake in a day and eat at regular intervals

## 2024-07-17 ENCOUNTER — Other Ambulatory Visit: Payer: Self-pay | Admitting: Internal Medicine

## 2024-07-17 ENCOUNTER — Telehealth: Payer: Self-pay

## 2024-07-17 ENCOUNTER — Telehealth: Payer: Self-pay | Admitting: Internal Medicine

## 2024-07-17 DIAGNOSIS — I10 Essential (primary) hypertension: Secondary | ICD-10-CM

## 2024-07-17 MED ORDER — LOSARTAN POTASSIUM 25 MG PO TABS: 25.0000 mg | ORAL_TABLET | Freq: Every day | ORAL | 3 refills | Status: AC

## 2024-07-17 NOTE — Telephone Encounter (Signed)
  Patient said Dr. Rachele advised her to see Dr. Mallipeddi. 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 She wants to know how serious this is and if she need to see Dr. Mallipeddi as soon as possible. She will wait for an availability in [place] office if its not urgent

## 2024-07-17 NOTE — Progress Notes (Signed)
   07/17/2024  Patient ID: Sandra Henry, female   DOB: 21-Dec-1945, 78 y.o.   MRN: [id number]  Pharmacy Quality Measure Review  This patient is appearing on a report for being at risk of failing the adherence measure for hypertension (ACEi/ARB) medications this calendar year.   Medication: losartan  25mg  once daily. dispensed 06/12/24 90 DS.  Insurance report was not up to date. No action needed at this time.   Lang Sieve, PharmD, BCGP Clinical Pharmacist  [phone]

## 2024-07-17 NOTE — Telephone Encounter (Signed)
 Copied from CRM #[id number]. Topic: Clinical - Medication Refill >> Jul 17, 2024  9:05 AM Roselie BROCKS wrote: Medication: losartan  (COZAAR ) 25 MG tablet metoprolol  succinate (TOPROL -XL) 25 MG 24 hr tablet LORazepam  (ATIVAN ) 1 MG tablet- half in morning 1 at night for sleep , patient is requesting to be changed to half in morning, and 1 at night to help sleep   Has the patient contacted their pharmacy? Yes (Agent: If no, request that the patient contact the pharmacy for the refill. If patient does not wish to contact the pharmacy document the reason why and proceed with request.) (Agent: If yes, when and what did the pharmacy advise?)  This is the patient's preferred pharmacy:  [facility] - [place], [place] - [address] [place] [address] Phone: [phone] Fax: [phone]  Is this the correct pharmacy for this prescription? Yes If no, delete pharmacy and type the correct one.   Has the prescription been filled recently? Yes  Is the patient out of the medication? Yes  Has the patient been seen for an appointment in the last year OR does the patient have an upcoming appointment? Yes  Can we respond through MyChart? No  Agent: Please be advised that Rx refills may take up to 3 business days. We ask that you follow-up with your pharmacy.

## 2024-07-17 NOTE — Telephone Encounter (Signed)
 Message fwd to provider for further advice.   Patient recently seen at [facility] - see notes in care everywhere.

## 2024-07-17 NOTE — Telephone Encounter (Signed)
 Copied from CRM [id number]. Topic: Clinical - Medication Question >> Jul 17, 2024 11:10 AM Arm[place] J wrote: Reason for CRM: Patient would like to know if she is supposed to continue taking omeprazole  (PRILOSEC) 20 MG capsule.   Patient is aware of the same day turn around. Please call [phone].

## 2024-07-18 NOTE — Telephone Encounter (Signed)
 Patient notified and verbalized understanding.

## 2024-07-18 NOTE — Telephone Encounter (Signed)
Pt informed

## 2024-07-19 ENCOUNTER — Other Ambulatory Visit: Payer: Self-pay | Admitting: Internal Medicine

## 2024-07-19 DIAGNOSIS — K2950 Unspecified chronic gastritis without bleeding: Secondary | ICD-10-CM

## 2024-07-20 ENCOUNTER — Telehealth (INDEPENDENT_AMBULATORY_CARE_PROVIDER_SITE_OTHER): Payer: Self-pay

## 2024-07-20 NOTE — Telephone Encounter (Signed)
 FYI: patient called in to report she has had some cardiac issues and wanted us  to get clearance from Dr. Stacia cardiology prior to scheduling her EGD. Patient has an appointment here with Dr. Cinderella on 08/02/2024 to discuss the need for the EGD. I let the patient know we usually do get clearance and we would discuss this further at her OV here with Dr. Cinderella on 08/02/2024. Patient states understanding.

## 2024-07-24 ENCOUNTER — Telehealth: Payer: Self-pay

## 2024-07-24 ENCOUNTER — Other Ambulatory Visit: Payer: Self-pay | Admitting: Internal Medicine

## 2024-07-24 DIAGNOSIS — Z961 Presence of intraocular lens: Secondary | ICD-10-CM | POA: Diagnosis not present

## 2024-07-24 DIAGNOSIS — I10 Essential (primary) hypertension: Secondary | ICD-10-CM

## 2024-07-24 DIAGNOSIS — H401133 Primary open-angle glaucoma, bilateral, severe stage: Secondary | ICD-10-CM | POA: Diagnosis not present

## 2024-07-24 DIAGNOSIS — H524 Presbyopia: Secondary | ICD-10-CM | POA: Diagnosis not present

## 2024-07-24 DIAGNOSIS — H04123 Dry eye syndrome of bilateral lacrimal glands: Secondary | ICD-10-CM | POA: Diagnosis not present

## 2024-07-24 DIAGNOSIS — H53433 Sector or arcuate defects, bilateral: Secondary | ICD-10-CM | POA: Diagnosis not present

## 2024-07-24 DIAGNOSIS — H5212 Myopia, left eye: Secondary | ICD-10-CM | POA: Diagnosis not present

## 2024-07-24 NOTE — Telephone Encounter (Unsigned)
 Copied from CRM [id number]. Topic: Clinical - Medication Refill >> Jul 24, 2024  4:44 PM Winona R wrote: Medication: Losartan  25 MG tablet Pt would like to confirm if she should be taking one or 2 tablets as she has been taking 1 tab 2 times daily since being discharged from the Hospital but order is for 1 daily.   Has the patient contacted their pharmacy? No (Agent: If no, request that the patient contact the pharmacy for the refill. If patient does not wish to contact the pharmacy document the reason why and proceed with request.) (Agent: If yes, when and what did the pharmacy advise?)  This is the patient's preferred pharmacy:  [facility] - [place], [place] - [address] [place] [address] Phone: [phone] Fax: [phone]  Is this the correct pharmacy for this prescription? Yes If no, delete pharmacy and type the correct one.   Has the prescription been filled recently? Yes  Is the patient out of the medication? Yes  Has the patient been seen for an appointment in the last year OR does the patient have an upcoming appointment? Yes  Can we respond through MyChart? NO call please   Agent: Please be advised that Rx refills may take up to 3 business days. We ask that you follow-up with your pharmacy.

## 2024-07-24 NOTE — Telephone Encounter (Signed)
 Please confirm correct dosage for patient, 25 mg or 50 mg daily

## 2024-07-24 NOTE — Telephone Encounter (Signed)
 Copied from CRM #[id number]. Topic: Clinical - Medication Question >> Jul 24, 2024  1:38 PM Berwyn MATSU wrote: Reason for CRM: Patient called in stating that when she was admitted to [facility] was told to up her Losartan  to  losartan  25 MG tablet Commonly known as: COZAAR  Take 1 tablet (25 mg total) by mouth two (2) times a day.  However Dr. Tobie prescribed   losartan  (COZAAR ) 25 MG tablet 90 tablet 3 07/17/2024 -  Sig - Route: Take 1 tablet (25 mg total) by mouth daily. - Oral  Sent to pharmacy as: losartan  (COZAAR ) 25 MG tablet  E-Prescribing Status: Receipt confirmed by pharmacy (07/17/2024 11:22 AM EDT)   Patient is requesting clarification if she needs to take 1 a day or 2 times a day then she needs a prescription reflecting the change.   May you please assist.

## 2024-07-25 NOTE — Telephone Encounter (Signed)
Pt has 3 refills?

## 2024-07-25 NOTE — Telephone Encounter (Signed)
 Pt states she may run out of medication before her next visit since she had been doing two. Needs rx sent to pharmacy.

## 2024-07-31 DIAGNOSIS — M8580 Other specified disorders of bone density and structure, unspecified site: Secondary | ICD-10-CM | POA: Diagnosis not present

## 2024-07-31 DIAGNOSIS — Z4689 Encounter for fitting and adjustment of other specified devices: Secondary | ICD-10-CM | POA: Diagnosis not present

## 2024-07-31 DIAGNOSIS — Z780 Asymptomatic menopausal state: Secondary | ICD-10-CM | POA: Diagnosis not present

## 2024-07-31 DIAGNOSIS — Z01419 Encounter for gynecological examination (general) (routine) without abnormal findings: Secondary | ICD-10-CM | POA: Diagnosis not present

## 2024-07-31 NOTE — Progress Notes (Signed)
 Subjective  CC: Annual Exam  HPI: Sandra Henry is a 78 y.o. y/o G1P1001 who presents today for annual exam   She denies any postmenopausal bleeding. She is not sexually active. Denies dyspareunia.  She denies to hypoestrogenic symptoms.  She completes her self breast exam monthly and has not found any abnormalities.  She  declined STD screen.    Last pap smear 2024.  She has stopped participation and cervical cancer screening.   She had a mammogram October 2025.  She had a colonoscopy on 2022.  Dermatology yearly, Dr. Shona.  Last DEXA scan August 2023 at [facility].  Patient has a history of osteopenia, she notes she was on Fosamax previously but is unsure of timeframe.  Currently using a ring with membrane, size 6 pessary for prolapse. Fit by Dr. Dorothyann Ku, urogynecologist. Patient is able to manage device on her own and using vaginal estrogen every other night   She has no concerns today.  -Recently hospitalized at [facility], states she is still recovering, has established care with a cardiologist and gastroenterologist with upcoming appointments.  She also is planning to establish care with new PCP, Dr. Marlee, at [facility] in [place].  Just celebrated her 78th birthday! ______________________________________________________________________  No LMP recorded. Patient is postmenopausal.  Current Medications[1]  Allergies[2]  Past Medical History[3]  Past Surgical History[4]  OB History  Gravida Para Term Preterm AB Living  1 1 1   1   SAB IAB Ectopic Molar Multiple Live Births       1    # Outcome Date GA Lbr Len/2nd Weight Sex Type Anes PTL Lv  1 Term 57    M Vag-Spont   LIV    Family History[5]  Social Drivers of Health   Food Insecurity: No Food Insecurity (06/25/2024)   Hunger Vital Sign   . Worried About Programme researcher, broadcasting/film/video in the Last Year: Never true   . Ran Out of Food in the Last Year: Never true  Tobacco Use:  Low Risk  (07/31/2024)   Patient History   . Smoking Tobacco Use: Never   . Smokeless Tobacco Use: Never   . Passive Exposure: Never  Transportation Needs: No Transportation Needs (06/25/2024)   PRAPARE - Transportation   . Lack of Transportation (Medical): No   . Lack of Transportation (Non-Medical): No  Alcohol Use: Not At Risk (07/31/2024)   Alcohol Use   . How often do you have a drink containing alcohol?: Never   . How many drinks containing alcohol do you have on a typical day when you are drinking?: 1 - 2   . How often do you have 5 or more drinks on one occasion?: Never  Housing: Low Risk  (06/25/2024)   Housing   . Within the past 12 months, have you ever stayed: outside, in a car, in a tent, in an overnight shelter, or temporarily in someone else's home (i.e. couch-surfing)?: No   . Are you worried about losing your housing?: No  Physical Activity: Inactive (06/25/2024)   Exercise Vital Sign   . Days of Exercise per Week: 0 days   . Minutes of Exercise per Session: 0 min  Utilities: Low Risk  (06/25/2024)   Utilities   . Within the past 12 months, have you been unable to get utilities (heat, electricity) when it was really needed?: No  Stress: No Stress Concern Present (06/25/2024)   [employer] of Occupational Health - Occupational Stress Questionnaire   .  Feeling of Stress: Only a little  Interpersonal Safety: Not At Risk (06/25/2024)   Interpersonal Safety   . Unsafe Where You Currently Live: No   . Physically Hurt by Anyone: No   . Abused by Anyone: No  Substance Use: Low Risk  (07/31/2024)   Substance Use   . In the past year, how often have you used prescription drugs for non-medical reasons?: Never   . In the past year, how often have you used illegal drugs?: Never   . In the past year, have you used any substance for non-medical reasons?: No  Intimate Partner Violence: Not At Risk (06/25/2024)   Humiliation, Afraid, Rape, and Kick questionnaire   . Fear of  Current or Ex-Partner: No   . Emotionally Abused: No   . Physically Abused: No   . Sexually Abused: No  Social Connections: Moderately Isolated (06/25/2024)   Social Connection and Isolation Panel   . Frequency of Communication with Friends and Family: More than three times a week   . Frequency of Social Gatherings with Friends and Family: More than three times a week   . Attends Religious Services: Never   . Active Member of Clubs or Organizations: No   . Attends Banker Meetings: Never   . Marital Status: Married  Programmer, applications: Low Risk  (06/25/2024)   Overall Financial Resource Strain (CARDIA)   . Difficulty of Paying Living Expenses: Not hard at all  Health Literacy: Low Risk  (07/31/2024)   Health Literacy   . : Never  Recent Concern: Health Literacy - Medium Risk (06/25/2024)   Health Literacy   . : Rarely  Internet Connectivity: No Internet connectivity concern identified (06/25/2024)   Internet Connectivity   . Do you have access to internet services: Yes   . How do you connect to the internet: Personal Device at home   . Is your internet connection strong enough for you to watch video on your device without major problems?: Yes   . Do you have enough data to get through the month?: Yes   . Does at least one of the devices have a camera that you can use for video chat?: Yes      GYNECOLOGIC HISTORY: -Hx of STI: No -Hx of abnormal Pap: No.    REVIEW OF SYSTEMS: General: no fevers Skin: no rashes, lesions, itching, HEENT: no frequent headaches, hearing changes, sore throat, dental problems, sinus problems   Lymph: no tender or swollen lymph nodes  Breasts: no lumps, nipple discharge, breast pain Resp: no SOB, cough, wheezing Cardio: no chest pain, palpitations, edema HP:[id number], bloating, reflux, blood in stool, leakage of stool  GU: no frequency, dysuria, flank pain, hematuria, incontinence, retention       Musk: no weakness, arthralgia,  joint swelling, ROM limitations Endocrine: no polydypsia, polyphagia, polyuria, heat/cold intolerance, hot flashes, dry skin Psych: no depression, anxiety, panic attacks  Objective   PHYSICAL EXAM: BP 128/64 (BP Position: Sitting)   Ht 162.6 cm (5' 4)   Wt 54 kg (119 lb)   BMI 20.43 kg/m  Constitutional: Well-developed, well-nourished female in no acute distress Neurological: Alert and oriented to person, place, and time Psychiatric: Mood and affect appropriate Skin: No rashes or lesions Neck: Supple without masses. Trachea is midline.Thyroid  is normal size without masses Lymphatics: No cervical, axillary, supraclavicular, or inguinal adenopathy noted Respiratory: Clear to auscultation bilaterally. Good air movement with normal work of  breathing. Cardiovascular: Regular rate and rhythm. Extremities grossly normal,  nontender with no edema; pulses regular Gastrointestinal: Soft, nontender, nondistended. No masses or hernias appreciated. No hepatosplenomegaly. No fluid wave. No rebound or guarding. Breast Exam: No tenderness, no dominant masses, no nipple discharge or nipple abnormality, no LAD Genitourinary:         External Genitalia: Normal female genitalia, no lesions    Urethra: Midline, no masses    Bladder: Well-suspended, NT    Vagina: Flattened vaginal rugae, no lesions, no discharge.     Cervix: No lesions, normal size and consistency; no cervical motion tenderness    Uterus: Normal size and contour; smooth, mobile, NT Adnexa/Parametria: No masses; no parametrial nodularity; no tenderness Perineum/Anus: No lesions  Pessary removed and cleaned and placed without difficulty. Patient tolerated procedure well.   Chaperone present for exam, Rosina Buffalo LPN.   ________________________________ Assessment/Plan:  Impression:  Sandra Henry is a 78 y.o. y/o G1P1001 who presents today for annual exam  Plan: >Cervical cancer screening: Up to date. - Pap smear  -patient no longer participates in cervical cancer screening due to age and history of no abnormals ever.  > Please notify the office for any postmenopausal bleeding.   > Breast cancer screening:  Up to date - Patient instructed on monthly self breast exam, notify office for abnormal findings. -Primary prevention for breast cancer includes participating in regular exercise, limiting alcohol consumption and maintaining a normal BMI.  > Colon cancer screening: Up to date.  - USPSTF recommends screening colonoscopy colonoscopy every 10 years beginning at age 33.  > Osteoporosis screening: - DEXA-due for updated imaging.  Concerns for osteoporosis based on the previous scan at [facility], results viewed through care everywhere in epic. - Advised Calcium  and Vitamin D  supplementation  > The American Heart Association recommends 150 minutes of moderate intensity exercise as well as strength resistance exercises weekly.   > RTC in 1 year or as needed.  1. Encounter for well woman exam (Primary)   2. Pessary maintenance Doing well with pessary, able to maintain device on her own.  Continue vaginal estrogen twice a week.  3. Menopause Tolerating menopausal transition well, plan for updated bone density imaging.  - Dexa Bone Density Skeletal; Future  4. Osteopenia, unspecified location  - Dexa Bone Density Skeletal; Future   Follow-up in 1 year or as needed  Burnard Lennie Amas, FNP                  [1]  Current Outpatient Medications:  .  amLODIPine  (NORVASC ) 5 MG tablet, Take 1 tablet (5 mg total) by mouth in the morning., Disp: , Rfl:  .  betamethasone , augmented, (DIPROLENE ) 0.05 % cream, , Disp: , Rfl:  .  estradiol (ESTRACE) 0.01 % (0.1 mg/gram) vaginal cream, Insert 2 g into the vagina Two (2) times a week., Disp: 42.5 g, Rfl: 5 .  LORazepam  (ATIVAN ) 1 MG tablet, Take 0.5 tablets (0.5 mg total) by mouth two (2) times a day for 5 days. TAKE 1 TABLET (1 MG  TOTAL) BY MOUTH TWO (2) TIMES A DAY., Disp: 5 tablet, Rfl: 0 .  losartan  (COZAAR ) 25 MG tablet, Take 1 tablet (25 mg total) by mouth two (2) times a day., Disp: , Rfl:  .  metoPROLOL  succinate (TOPROL -XL) 25 MG 24 hr tablet, Take 1 tablet (25 mg total) by mouth two (2) times a day., Disp: 180 tablet, Rfl: 0 .  multivitamin (MULTIVITAMIN) per tablet, Take 1 tablet by mouth., Disp: , Rfl:  .  netarsudil-latanoprost (  ROCKLATAN) 0.02-0.005 % Drop, Apply to eye., Disp: , Rfl:  .  omega-3 acid ethyl esters (LOVAZA) 1 gram capsule, Take by mouth., Disp: , Rfl:  .  omeprazole  (PRILOSEC) 20 MG capsule, , Disp: , Rfl:  .  ondansetron  (ZOFRAN ) 4 MG tablet, Take 1 tablet (4 mg total) by mouth., Disp: , Rfl:  .  ondansetron  (ZOFRAN ) 4 MG tablet, Take 1 tablet (4 mg total) by mouth every eight (8) hours as needed for nausea., Disp: , Rfl:  .  pantoprazole  (PROTONIX ) 40 MG tablet, Take 1 tablet (40 mg total) by mouth daily. TAKE 1 TABLET (40 MG TOTAL) BY MOUTH DAILY., Disp: , Rfl:  .  rosuvastatin  (CRESTOR ) 20 MG tablet, , Disp: , Rfl:  .  senna-docusate (SENNOSIDES-DOCUSATE SODIUM) 8.6-50 mg, Take 1 tablet by mouth daily., Disp: , Rfl:  .  timolol  (BETIMOL ) 0.25 % ophthalmic solution, Apply to eye., Disp: , Rfl:  [2] Allergies Allergen Reactions  . Codeine Nausea Only  . Miralax  [Polyethylene Glycol 3350 ] Other (See Comments)    Numbness in legs  . Penicillins Rash    AS A CHILD AS A CHILD   . Sulfa (Sulfonamide Antibiotics) Rash  . Sulfasalazine Rash  [3] Past Medical History: Diagnosis Date  . Hyperlipidemia   . Hypertension   . Moderate stage glaucoma   . TIA (transient ischemic attack) 06/2024  [4] Past Surgical History: Procedure Laterality Date  . COLONOSCOPY  2022  . EYE SURGERY    . HAND SURGERY Right    TRIGGER FINGER  . SPINE SURGERY  09/07/2018   Left L4-L5, L5-S1 laminectomy, foraminotomy, and left L5-S1 discectomy  . TUBES TIED    [5] Family History Problem Relation Age of  Onset  . Stroke Mother   . Heart disease Mother   . Stroke Father

## 2024-08-02 ENCOUNTER — Ambulatory Visit (INDEPENDENT_AMBULATORY_CARE_PROVIDER_SITE_OTHER): Admitting: Gastroenterology

## 2024-08-02 ENCOUNTER — Encounter (INDEPENDENT_AMBULATORY_CARE_PROVIDER_SITE_OTHER): Payer: Self-pay | Admitting: Gastroenterology

## 2024-08-02 VITALS — BP 110/62 | HR 69 | Temp 97.1°F | Ht 64.0 in | Wt 119.9 lb

## 2024-08-02 DIAGNOSIS — K5904 Chronic idiopathic constipation: Secondary | ICD-10-CM | POA: Diagnosis not present

## 2024-08-02 DIAGNOSIS — R110 Nausea: Secondary | ICD-10-CM | POA: Diagnosis not present

## 2024-08-02 DIAGNOSIS — K2950 Unspecified chronic gastritis without bleeding: Secondary | ICD-10-CM

## 2024-08-02 DIAGNOSIS — R1319 Other dysphagia: Secondary | ICD-10-CM | POA: Insufficient documentation

## 2024-08-02 DIAGNOSIS — R630 Anorexia: Secondary | ICD-10-CM | POA: Diagnosis not present

## 2024-08-02 MED ORDER — SENNOSIDES-DOCUSATE SODIUM 8.6-50 MG PO TABS: 1.0000 | ORAL_TABLET | Freq: Every day | ORAL | 0 refills | Status: AC

## 2024-08-02 MED ORDER — LINACLOTIDE 145 MCG PO CAPS: 145.0000 ug | ORAL_CAPSULE | Freq: Every day | ORAL | 1 refills | Status: DC

## 2024-08-02 MED ORDER — ONDANSETRON HCL 4 MG PO TABS: 4.0000 mg | ORAL_TABLET | Freq: Two times a day (BID) | ORAL | 0 refills | Status: AC

## 2024-08-02 MED ORDER — PANTOPRAZOLE SODIUM 40 MG PO TBEC: 40.0000 mg | DELAYED_RELEASE_TABLET | Freq: Every day | ORAL | 3 refills | Status: AC

## 2024-08-02 NOTE — Progress Notes (Signed)
 Sande Pickert Faizan Sandar Krinke , M.D. Gastroenterology & Hepatology [facility] [address] [place], [place] [address] Primary Care Physician: Tobie Suzzane POUR, MD [address] [place] [address]  Chief Complaint: Nausea, decreased appetite, constipation and fatigue, dysphagia  History of Present Illness:  Sandra Henry is a 78 y.o. female with HTN, HLD, glaucoma who presents for evaluation of Nausea, decreased appetite, constipation and fatigue  Patient was last seen 05/2024 with myself where she underwent CT abdomen pelvis which was negative.  Apparently patient was recently admitted paralysis like symptoms' x2 , patient thought it was due to MiraLAX   Patient was admitted 04/2024 for strokelike symptoms where CT head with concern for multiple thalamic infarcts of indeterminate age but MRI shows no acute infarct, negative for acute finding of any kind .MRI, Echo (both negative)   she has been nauseous with significantly decreased appetite.  Patient having a bowel movement every 2 to 3 days which would alternate between hard stool pebble-like and liquid stool.  Patient also reports solid food dysphagia as if food getting stuck in middle of her chest.   Currently patient is only taking senna and have taken Linzess only intermittently   hemoglobin 13 creatinine 1.56 normal liver enzymes TSH within normal limits Last ZHI:wnwz Last Colonoscopy:2022  - Diverticulosis in the sigmoid colon. - External hemorrhoids.  No further colonoscopy recommended  FHx: neg for any gastrointestinal/liver disease, no malignancies Social: neg smoking, alcohol or illicit drug use Surgical: no abdominal surgeries  Stress test negative 06/2024  Past Medical History: Past Medical History:  Diagnosis Date   Dental bridge present    upper   Dental crowns present    Dizziness and giddiness    Glaucoma    Hypercalcemia    Hypertension    under control with  med., has been on med. x 4 yr.   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   index, middle and ring fingers    Past Surgical History: Past Surgical History:  Procedure Laterality Date   BREAST BIOPSY Left    CATARACT EXTRACTION W/ INTRAOCULAR LENS IMPLANT Bilateral    COLONOSCOPY N/A 01/30/2021   Procedure: COLONOSCOPY;  Surgeon: Golda Claudis PENNER, MD;  [facility];  Service: Endoscopy;  Laterality: N/A;  am   TRIGGER FINGER RELEASE Right 02/20/2014   Procedure: RELEASE A-1 PULLEY RIGHT INDEX,  MIDDLE AND RING FINGERS ;  Surgeon: Arley JONELLE Curia, MD;  Location: [facility];  Service: Orthopedics;  Laterality: Right;   TUBAL LIGATION     laparoscopic    Family History: Family History  Problem Relation Age of Onset   Stroke Mother    Migraines Mother    Stroke Father    Heart attack Brother    Stroke Sister     Social History: Social History   Tobacco Use  Smoking Status Never  Smokeless Tobacco Never   Social History   Substance and Sexual Activity  Alcohol Use No   Social History   Substance and Sexual Activity  Drug Use No    Allergies: Allergies  Allergen Reactions   Codeine Nausea Only   Penicillins Rash    AS A CHILD   Sulfa Antibiotics Rash    Medications: Current Outpatient Medications  Medication Sig Dispense Refill   amLODipine  (NORVASC ) 5 MG tablet Take 1 tablet (5 mg total) by mouth daily. (  Patient taking differently: Take 5 mg by mouth in the morning and at bedtime.) 90 tablet 3   Ascorbic Acid (VITAMIN C) 1000 MG tablet Take 1,000 mg by mouth daily.     aspirin 81 MG tablet Take 81 mg by mouth daily.     Calcium -Magnesium-Vitamin D  600-40-500 MG-MG-UNIT TB24 Take 1 tablet by mouth 2 (two) times daily.     cholecalciferol (VITAMIN D ) 400 UNITS TABS tablet Take 400 Units by mouth 2 (two) times daily.     Coenzyme Q10 (CO Q 10 PO) Take by mouth.      estradiol (ESTRACE) 0.1 MG/GM vaginal cream Place vaginally.     FIBER PO Take by mouth.     LORazepam  (ATIVAN ) 1 MG tablet take 0.5 tablets (0.5 milligram total) by mouth 2 (two) times daily as needed for anxiety. 60 tablet 0   losartan  (COZAAR ) 25 MG tablet Take 1 tablet (25 mg total) by mouth daily. 90 tablet 3   Meclizine HCl 25 MG CHEW Chew 25 mg by mouth.     metoprolol  succinate (TOPROL -XL) 25 MG 24 hr tablet take 1 AND 1/2 tablets (37.5 MILLIGRAM total) by mouth 2 (two) times daily. 90 tablet 0   Multiple Vitamin (MULTIVITAMIN) tablet Take 1 tablet by mouth daily.     Netarsudil-Latanoprost (ROCKLATAN) 0.02-0.005 % SOLN Place 1 drop into both eyes at bedtime.     Omega-3 Fatty Acids (FISH OIL) 1000 MG CAPS Take 1,000 mg by mouth daily.     ondansetron  (ZOFRAN ) 4 MG tablet take 1 tablet (4 MILLIGRAM total) by mouth every 8 (eight) hours as needed for nausea or vomiting. 20 tablet 0   pantoprazole  (PROTONIX ) 40 MG tablet Take 1 tablet (40 mg total) by mouth daily. 60 tablet 3   rosuvastatin  (CRESTOR ) 20 MG tablet Take 1 tablet (20 mg total) by mouth daily. 90 tablet 1   SENNA PO Take 2 tablets by mouth.     timolol  (TIMOPTIC ) 0.5 % ophthalmic solution Place 1 drop into both eyes every morning. 10 mL 0   Garlic 10 MG CAPS Take 10 mg by mouth daily.     Glucosamine-Chondroitin 500-400 MG CAPS Take 2 tablets by mouth daily.     LINZESS 72 MCG capsule Take 72 mcg by mouth daily before breakfast. (Patient not taking: Reported on 08/02/2024)     No current facility-administered medications for this visit.    Review of Systems: GENERAL: negative for malaise, night sweats HEENT: No changes in hearing or vision, no nose bleeds or other nasal problems. NECK: Negative for lumps, goiter, pain and significant neck swelling RESPIRATORY: Negative for cough, wheezing CARDIOVASCULAR: Negative for chest pain, leg swelling, palpitations, orthopnea GI: SEE HPI MUSCULOSKELETAL: Negative for joint pain  or swelling, back pain, and muscle pain. SKIN: Negative for lesions, rash HEMATOLOGY Negative for prolonged bleeding, bruising easily, and swollen nodes. ENDOCRINE: Negative for cold or heat intolerance, polyuria, polydipsia and goiter. NEURO: negative for tremor, gait imbalance, syncope and seizures. The remainder of the review of systems is noncontributory.   Physical Exam: BP 110/62   Pulse 69   Temp (!) 97.1 F (36.2 C)   Ht 5' 4 (1.626 m)   Wt 119 lb 14.4 oz (54.4 kg)   BMI 20.58 kg/m  GENERAL: The patient is AO x3, in no acute distress. HEENT: Head is normocephalic and atraumatic. EOMI are intact. Mouth is well hydrated and without lesions. NECK: Supple. No masses LUNGS: Clear to auscultation. No presence of rhonchi/wheezing/rales. Adequate  chest expansion HEART: RRR, normal s1 and s2. ABDOMEN: Soft, nontender, no guarding, no peritoneal signs, and nondistended. BS +. No masses.  Imaging/Labs: as above     Latest Ref Rng & Units 03/16/2024    6:40 AM 01/03/2024    7:54 AM 12/19/2021    2:47 PM  CBC  WBC 4.0 - 10.5 K/uL 6.0  8.2  7.6   Hemoglobin 12.0 - 15.0 g/dL 86.9  88.5  86.2   Hematocrit 36.0 - 46.0 % 38.9  34.9  40.9   Platelets 150 - 400 K/uL 268  302  321    No results found for: IRON, TIBC, FERRITIN  I personally reviewed and interpreted the available labs, imaging and endoscopic files.  CT abdomen and pelvis  IMPRESSION: No acute findings.   Colonic diverticulosis, without radiographic evidence of diverticulitis.   Tiny hiatal hernia.   Small fat-containing umbilical and paraumbilical hernias.    Impression and Plan: Sandra Henry is a 78 y.o. female with HTN, HLD, glaucoma who presents for evaluation of Nausea, decreased appetite, constipation and fatigue  #Dysphagia #Loss of Appetite/Nausea #Constipation   Patient has solid food dysphagia which could be GERD, esophageal web ring or stricture but need to rule out malignancy given  recent alarm symptom of  loss of appetite and new onset dyspepsia  As per ACG guideline with patient above age 25 with new onset dyspepsia upper endoscopy is the next  Schedule upper endoscopy with dilation after neurology clearance given multiple ED visits for paralysis like symptoms  MiraLAX  unlikely is a culprit here as it is not known to cause paralysis  Recent CT abdomen pelvis was reassuring, without any pancreatico-biliary pathology identified  Patient symptoms can be also explained by underlying constipation which can lead to abdominal pain nausea and also attributed to loss of appetite and severe cases  Ensure adequate fluid intake: Aim for 8 glasses of water  daily. Uptitrate Linzess 145 mcg  protonix  40mg , 30 min before breakfast  I thoroughly discussed with the patient the procedure, including the risks involved. Patient understands what the procedure involves including the benefits and any risks. Patient understands alternatives to the proposed procedure. Risks including (but not limited to) bleeding, tearing of the lining (perforation), rupture of adjacent organs, problems with heart and lung function, infection, and medication reactions. A small percentage of complications may require surgery, hospitalization, repeat endoscopic procedure, and/or transfusion.  Patient understood and agreed.    All questions were answered.      Saadia Dewitt Faizan Skyla Champagne, MD Gastroenterology and Hepatology [facility] Gastroenterology   This chart has been completed using [facility] Dictation software, and while attempts have been made to ensure accuracy , certain words and phrases may not be transcribed as intended

## 2024-08-02 NOTE — Patient Instructions (Addendum)
 It was very nice to meet you today, as dicussed with will plan for the following :  1) Linzess 145mcg , if you have 72mcg bottle than take 2 tabs daily  2) Protonix40mg  daily  3) Zofran  as needed  4) Can take Senna-S if no bowel movemenst despite linzess  5) Upper endoscopy after cardiology and neurology clearance

## 2024-08-03 ENCOUNTER — Telehealth (INDEPENDENT_AMBULATORY_CARE_PROVIDER_SITE_OTHER): Payer: Self-pay | Admitting: Gastroenterology

## 2024-08-03 DIAGNOSIS — K5904 Chronic idiopathic constipation: Secondary | ICD-10-CM

## 2024-08-03 DIAGNOSIS — R7989 Other specified abnormal findings of blood chemistry: Secondary | ICD-10-CM | POA: Diagnosis not present

## 2024-08-03 MED ORDER — LUBIPROSTONE 8 MCG PO CAPS: 8.0000 ug | ORAL_CAPSULE | Freq: Two times a day (BID) | ORAL | 1 refills | Status: DC

## 2024-08-03 NOTE — Telephone Encounter (Signed)
 Pt left voicemail wanting to know if Linzess is available in a generic brand since Linzess is costly and insurance does not cover much. Please advise. Thank you!

## 2024-08-03 NOTE — Telephone Encounter (Signed)
 Pt contacted and verbalized understanding.

## 2024-08-03 NOTE — Telephone Encounter (Signed)
 I will send in lubiprostone, which is a twice daily drug ( will start at 8mcg which is the lowest dose and uptitrate later)

## 2024-08-03 NOTE — Telephone Encounter (Signed)
 PA completed via cover my meds for Ondansetron  4 mg tablets.

## 2024-08-03 NOTE — Telephone Encounter (Signed)
 PA denied. Contacted pharmacy and they state pt has picked up med.

## 2024-08-03 NOTE — Addendum Note (Signed)
 Addended by: CINDERELLA DEATRICE SMILES on: 08/03/2024 08:27 AM   Modules accepted: Orders

## 2024-08-04 LAB — TSH+FREE T4
Free T4: 1.24 ng/dL (ref 0.82–1.77)
TSH: 2.63 u[IU]/mL (ref 0.450–4.500)

## 2024-08-07 ENCOUNTER — Ambulatory Visit: Payer: Self-pay | Admitting: Internal Medicine

## 2024-08-07 ENCOUNTER — Telehealth: Payer: Self-pay

## 2024-08-07 ENCOUNTER — Ambulatory Visit: Admitting: Internal Medicine

## 2024-08-07 ENCOUNTER — Encounter: Payer: Self-pay | Admitting: Internal Medicine

## 2024-08-07 ENCOUNTER — Ambulatory Visit (INDEPENDENT_AMBULATORY_CARE_PROVIDER_SITE_OTHER): Payer: Self-pay

## 2024-08-07 VITALS — BP 122/82 | HR 64 | Ht 64.0 in | Wt 120.8 lb

## 2024-08-07 VITALS — BP 122/82 | Ht 64.0 in | Wt 120.0 lb

## 2024-08-07 DIAGNOSIS — K2950 Unspecified chronic gastritis without bleeding: Secondary | ICD-10-CM | POA: Diagnosis not present

## 2024-08-07 DIAGNOSIS — F411 Generalized anxiety disorder: Secondary | ICD-10-CM | POA: Diagnosis not present

## 2024-08-07 DIAGNOSIS — K5904 Chronic idiopathic constipation: Secondary | ICD-10-CM | POA: Diagnosis not present

## 2024-08-07 DIAGNOSIS — I471 Supraventricular tachycardia, unspecified: Secondary | ICD-10-CM | POA: Diagnosis not present

## 2024-08-07 DIAGNOSIS — I10 Essential (primary) hypertension: Secondary | ICD-10-CM | POA: Diagnosis not present

## 2024-08-07 DIAGNOSIS — Z0000 Encounter for general adult medical examination without abnormal findings: Secondary | ICD-10-CM

## 2024-08-07 MED ORDER — LORAZEPAM 1 MG PO TABS: 1.0000 mg | ORAL_TABLET | Freq: Two times a day (BID) | ORAL | 2 refills | Status: AC | PRN

## 2024-08-07 MED ORDER — METOPROLOL SUCCINATE ER 25 MG PO TB24: 37.5000 mg | ORAL_TABLET | Freq: Two times a day (BID) | ORAL | 2 refills | Status: DC

## 2024-08-07 NOTE — Progress Notes (Unsigned)
 Established Patient Office Visit  Subjective:  Patient ID: Sandra Henry, female    DOB: Jul 23, 1946  Age: 78 y.o. MRN: [id number]  CC:  Chief Complaint  Patient presents with   Hypertension    Follow up   Gastroesophageal Reflux    Follow up   Anxiety    Would like to discuss lorazepam     HPI Sandra Henry is a 78 y.o. female with past medical history of HTN, HLD, glaucoma who presents for follow-up of her chronic medical conditions.  She has chronic epigastric discomfort, nausea and lack of appetite. She was given Pantoprazole  by GI, which has improved her symptoms.  Denies melena or hematochezia.  She still reports lack of appetite and fatigue, but feels overall better compared to prior.  HTN: BP is well-controlled today.  She takes amlodipine  5 mg QD, losartan  25 mg QD and metoprolol  37.5 mg twice daily now.  Patient denies headache, dizziness, chest pain, dyspnea or palpitations.  GAD: She takes Ativan  0.5 mg BID currently, but reports feeling anxious despite taking it at times.  She has difficulty maintaining sleep as well.  She has taken Ativan  for many years.  Denies SI or HI currently.  Past Medical History:  Diagnosis Date   Dental bridge present    upper   Dental crowns present    Dizziness and giddiness    Glaucoma    Hypercalcemia    Hypertension    under control with med., has been on med. x 4 yr.   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   index, middle and ring fingers    Past Surgical History:  Procedure Laterality Date   BREAST BIOPSY Left    CATARACT EXTRACTION W/ INTRAOCULAR LENS IMPLANT Bilateral    COLONOSCOPY N/A 01/30/2021   Procedure: COLONOSCOPY;  Surgeon: Golda Claudis PENNER, MD;  Location: [facility];  Service: Endoscopy;  Laterality: N/A;  am   TRIGGER FINGER RELEASE Right 02/20/2014   Procedure: RELEASE A-1 PULLEY RIGHT INDEX,  MIDDLE AND RING FINGERS ;   Surgeon: Arley JONELLE Curia, MD;  Location: [facility];  Service: Orthopedics;  Laterality: Right;   TUBAL LIGATION     laparoscopic    Family History  Problem Relation Age of Onset   Stroke Mother    Migraines Mother    Stroke Father    Heart attack Brother    Stroke Sister     Social History   Socioeconomic History   Marital status: Married    Spouse name: Not on file   Number of children: Not on file   Years of education: Not on file   Highest education level: Not on file  Occupational History   Occupation: medical receptionist    Employer: [employer]  Tobacco Use   Smoking status: Never   Smokeless tobacco: Never  Vaping Use   Vaping status: Never Used  Substance and Sexual Activity   Alcohol use: No   Drug use: No   Sexual activity: Not on file  Other Topics Concern   Not on file  Social History Narrative   Lives at home w/ her husband   Left-handed   Caffeine: occasional soft drink   Social Drivers of Corporate Investment Banker Strain: Low Risk  (08/07/2024)   Overall Financial Resource Strain (CARDIA)    Difficulty of Paying Living Expenses: Not hard at all  Food Insecurity: No Food Insecurity (08/07/2024)   Hunger Vital Sign    Worried About Running Out of Food in the Last Year: Never true    Ran Out of Food in the Last Year: Never true  Transportation Needs: No Transportation Needs (08/07/2024)   PRAPARE - Administrator, Civil Service (Medical): No    Lack of Transportation (Non-Medical): No  Physical Activity: Sufficiently Active (08/07/2024)   Exercise Vital Sign    Days of Exercise per Week: 7 days    Minutes of Exercise per Session: 30 min  Recent Concern: Physical Activity - Inactive (06/25/2024)   Received from [facility]   Exercise Vital Sign    On average, how many days per week do you engage in moderate to strenuous exercise (like a brisk walk)?: 0 days    On average, how many minutes do you engage in exercise  at this level?: 0 min  Stress: No Stress Concern Present (08/07/2024)   [employer] of Occupational Health - Occupational Stress Questionnaire    Feeling of Stress: Not at all  Social Connections: Socially Integrated (08/07/2024)   Social Connection and Isolation Panel    Frequency of Communication with Friends and Family: More than three times a week    Frequency of Social Gatherings with Friends and Family: More than three times a week    Attends Religious Services: More than 4 times per year    Active Member of [employer] or Organizations: Yes    Attends Engineer, Structural: More than 4 times per year    Marital Status: Married  Recent Concern: Social Connections - Moderately Isolated (06/25/2024)   Received from [facility]   Social Connection and Isolation Panel    In a typical week, how many times do you talk on the phone with family, friends, or neighbors?: More than three times a week    How often do you get together with friends or relatives?: More than three times a week    How often do you attend church or religious services?: Never    Do you belong to any clubs or organizations such as church groups, unions, fraternal or athletic groups, or school groups?: No    How often do you attend meetings of the clubs or organizations you belong to?: Never    Are you married, widowed, divorced, separated, never married, or living with a partner?: Married  Intimate Partner Violence: Not At Risk (08/07/2024)   Humiliation, Afraid, Rape, and Kick questionnaire    Fear of Current or Ex-Partner: No    Emotionally Abused: No    Physically Abused: No    Sexually Abused: No    Outpatient Medications Prior to Visit  Medication Sig Dispense Refill   amLODipine  (NORVASC ) 5 MG tablet Take 1 tablet (5 mg total) by mouth daily. (Patient taking differently: Take 5 mg by mouth in the morning and at bedtime.) 90 tablet 3   Ascorbic Acid (VITAMIN C) 1000 MG tablet Take 1,000 mg by mouth  daily.     aspirin 81 MG tablet Take 81 mg by mouth daily.     Calcium -Magnesium-Vitamin D  600-40-500 MG-MG-UNIT TB24 Take 1 tablet by mouth 2 (two) times daily.     cholecalciferol (VITAMIN D ) 400 UNITS TABS tablet Take 400 Units by mouth 2 (two) times daily.     Coenzyme Q10 (CO Q 10 PO) Take by mouth.     estradiol (ESTRACE) 0.1 MG/GM vaginal cream Place vaginally.  FIBER PO Take by mouth.     Garlic 10 MG CAPS Take 10 mg by mouth daily.     Glucosamine-Chondroitin 500-400 MG CAPS Take 2 tablets by mouth daily.     losartan  (COZAAR ) 25 MG tablet Take 1 tablet (25 mg total) by mouth daily. 90 tablet 3   lubiprostone (AMITIZA) 8 MCG capsule Take 1 capsule (8 mcg total) by mouth 2 (two) times daily with a meal. 180 capsule 1   Meclizine HCl 25 MG CHEW Chew 25 mg by mouth.     Multiple Vitamin (MULTIVITAMIN) tablet Take 1 tablet by mouth daily.     Netarsudil-Latanoprost (ROCKLATAN) 0.02-0.005 % SOLN Place 1 drop into both eyes at bedtime.     Omega-3 Fatty Acids (FISH OIL) 1000 MG CAPS Take 1,000 mg by mouth daily.     ondansetron  (ZOFRAN ) 4 MG tablet Take 1 tablet (4 mg total) by mouth 2 (two) times daily. 20 tablet 0   pantoprazole  (PROTONIX ) 40 MG tablet Take 1 tablet (40 mg total) by mouth daily. 60 tablet 3   rosuvastatin  (CRESTOR ) 20 MG tablet Take 1 tablet (20 mg total) by mouth daily. 90 tablet 1   SENNA PO Take 2 tablets by mouth.     senna-docusate (SENOKOT S) 8.6-50 MG tablet Take 1 tablet by mouth at bedtime. 30 tablet 0   timolol  (TIMOPTIC ) 0.5 % ophthalmic solution Place 1 drop into both eyes every morning. 10 mL 0   linaclotide (LINZESS) 145 MCG CAPS capsule Take 1 capsule (145 mcg total) by mouth daily. 90 capsule 1   LORazepam  (ATIVAN ) 1 MG tablet take 0.5 tablets (0.5 milligram total) by mouth 2 (two) times daily as needed for anxiety. 60 tablet 0   metoprolol  succinate (TOPROL -XL) 25 MG 24 hr tablet take 1 AND 1/2 tablets (37.5 MILLIGRAM total) by mouth 2 (two) times  daily. 90 tablet 0   No facility-administered medications prior to visit.    Allergies  Allergen Reactions   Sulfa Antibiotics Rash, Dermatitis and Nausea Only    Other Reaction(s): Not available   Polyethylene Glycol 3350  Other (See Comments)    Numbness in legs   Codeine Nausea Only    Other Reaction(s): Not available   Penicillins Rash    AS A CHILD  Other Reaction(s): Not available    ROS Review of Systems  Constitutional:  Positive for appetite change and fatigue. Negative for chills and fever.  HENT:  Negative for congestion, sinus pressure, sinus pain and sore throat.   Eyes:  Negative for pain and discharge.  Respiratory:  Negative for cough and shortness of breath.   Cardiovascular:  Negative for chest pain and palpitations.  Gastrointestinal:  Positive for constipation. Negative for diarrhea, nausea and vomiting.  Endocrine: Negative for polydipsia and polyuria.  Genitourinary:  Negative for dysuria and hematuria.  Musculoskeletal:  Negative for neck pain and neck stiffness.  Skin:  Negative for rash.  Neurological:  Negative for dizziness and weakness.  Psychiatric/Behavioral:  Positive for sleep disturbance. Negative for agitation and behavioral problems. The patient is nervous/anxious.       Objective:    Physical Exam Vitals reviewed.  Constitutional:      General: She is not in acute distress.    Appearance: She is not diaphoretic.  HENT:     Head: Normocephalic and atraumatic.     Nose: Nose normal.     Mouth/Throat:     Mouth: Mucous membranes are moist.  Eyes:  General: No scleral icterus.    Extraocular Movements: Extraocular movements intact.  Cardiovascular:     Rate and Rhythm: Normal rate and regular rhythm.     Heart sounds: Normal heart sounds. No murmur heard. Pulmonary:     Breath sounds: Normal breath sounds. No wheezing or rales.  Musculoskeletal:     Right lower leg: No edema.     Left lower leg: No edema.  Skin:    General:  Skin is warm.     Findings: No rash.  Neurological:     General: No focal deficit present.     Mental Status: She is alert and oriented to person, place, and time.     Sensory: No sensory deficit.     Motor: No weakness.  Psychiatric:        Mood and Affect: Mood normal.        Behavior: Behavior normal.     BP 122/82   Pulse 64   Ht 5' 4 (1.626 m)   Wt 120 lb 12.8 oz (54.8 kg)   SpO2 100%   BMI 20.74 kg/m  Wt Readings from Last 3 Encounters:  08/10/24 120 lb 12.8 oz (54.8 kg)  08/09/24 120 lb 12.8 oz (54.8 kg)  08/07/24 120 lb (54.4 kg)    Lab Results  Component Value Date   TSH 2.630 08/03/2024   Lab Results  Component Value Date   WBC 6.0 03/16/2024   HGB 13.0 03/16/2024   HCT 38.9 03/16/2024   MCV 100.0 03/16/2024   PLT 268 03/16/2024   Lab Results  Component Value Date   NA 144 01/03/2024   K 3.7 01/03/2024   CO2 17 (L) 01/03/2024   GLUCOSE 74 01/03/2024   BUN 46 (H) 01/03/2024   CREATININE 1.56 (H) 01/03/2024   BILITOT 0.2 01/03/2024   ALKPHOS 61 01/03/2024   AST 8 01/03/2024   ALT 13 01/03/2024   PROT 7.4 01/03/2024   ALBUMIN 4.5 01/03/2024   CALCIUM  9.6 01/03/2024   ANIONGAP 11 12/19/2021   EGFR 34 (L) 01/03/2024   Lab Results  Component Value Date   CHOL 140 01/03/2024   Lab Results  Component Value Date   HDL 42 01/03/2024   Lab Results  Component Value Date   LDLCALC 79 01/03/2024   Lab Results  Component Value Date   TRIG 103 01/03/2024   Lab Results  Component Value Date   CHOLHDL 3.3 01/03/2024   Lab Results  Component Value Date   HGBA1C 6.1 (H) 01/03/2024      Assessment & Plan:   Problem List Items Addressed This Visit       Cardiovascular and Mediastinum   Essential hypertension - Primary (Chronic)   BP Readings from Last 1 Encounters:  08/07/24 122/82   Well-controlled with amlodipine  5 mg QD, losartan  25 mg QD and metoprolol  37.5 mg BID Counseled for compliance with the medications Advised DASH diet and  ambulate as tolerated      PSVT (paroxysmal supraventricular tachycardia) (Chronic)   Has intermittent palpitations, on metoprolol  Followed by cardiology        Digestive   Chronic gastritis without bleeding   Better controlled with pantoprazole  Has been evaluated by GI Zofran  as needed for nausea Maintain adequate hydration Avoid hot and spicy food      Chronic idiopathic constipation   Was evaluated by GI recently Did not tolerate Linzess Better with Amitiza 8 mg BID now Takes MiraLAX  as needed for constipation as well Advised to  maintain adequate hydration        Other   GAD (generalized anxiety disorder)   Uncontrolled currently Takes Ativan  0.5 mg BID PDMP reviewed Adjusted dose of Ativan  to 1 mg BID, advised to take 0.5-1 mg BID PRN for anxiety      Relevant Medications   LORazepam  (ATIVAN ) 1 MG tablet      Meds ordered this encounter  Medications   DISCONTD: metoprolol  succinate (TOPROL -XL) 25 MG 24 hr tablet    Sig: Take 1.5 tablets (37.5 mg total) by mouth 2 (two) times daily.    Dispense:  90 tablet    Refill:  2    NA   LORazepam  (ATIVAN ) 1 MG tablet    Sig: Take 1 tablet (1 mg total) by mouth 2 (two) times daily as needed for anxiety.    Dispense:  60 tablet    Refill:  2    NA    Follow-up: Return in about 4 months (around 12/08/2024) for GAD and HTN.    Suzzane MARLA Blanch, MD

## 2024-08-07 NOTE — Patient Instructions (Signed)
 [name],  Thank you for taking the time for your Medicare Wellness Visit. I appreciate your continued commitment to your health goals. Please review the care plan we discussed, and feel free to reach out if I can assist you further.  Medicare recommends these wellness visits once per year to help you and your care team stay ahead of potential health issues. These visits are designed to focus on prevention, allowing your provider to concentrate on managing your acute and chronic conditions during your regular appointments.  Please note that Annual Wellness Visits do not include a physical exam. Some assessments may be limited, especially if the visit was conducted virtually. If needed, we may recommend a separate in-person follow-up with your provider.  Wishing you excellent health and many blessings in the year to come!  -Karne Ozga, CMA  Ongoing Care Seeing your primary care provider every 3 to 6 months helps us  monitor your health and provide consistent, personalized care.   Recommended Screenings:  Health Maintenance  Topic Date Due   COVID-19 Vaccine (1) Never done   Hepatitis C Screening  Never done   DTaP/Tdap/Td vaccine (1 - Tdap) Never done   DEXA scan (bone density measurement)  06/11/2024   Breast Cancer Screening  07/13/2025   Medicare Annual Wellness Visit  08/07/2025   Pneumococcal Vaccine for age over 106  Completed   Flu Shot  Completed   Zoster (Shingles) Vaccine  Completed   Meningitis B Vaccine  Aged Out   Colon Cancer Screening  Discontinued       08/07/2024    3:05 PM  Advanced Directives  Does Patient Have a Medical Advance Directive? No  Would patient like information on creating a medical advance directive? No - Patient declined   Advance Care Planning is important because it: Ensures you receive medical care that aligns with your values, goals, and preferences. Provides guidance to your family and loved ones, reducing the emotional burden of decision-making  during critical moments.  Vision: Annual vision screenings are recommended for early detection of glaucoma, cataracts, and diabetic retinopathy. These exams can also reveal signs of chronic conditions such as diabetes and high blood pressure.  Dental: Annual dental screenings help detect early signs of oral cancer, gum disease, and other conditions linked to overall health, including heart disease and diabetes.  Please see the attached documents for additional preventive care recommendations.

## 2024-08-07 NOTE — Patient Instructions (Addendum)
 Please take Lorazepam  0.5-1 tablet twice daily as needed for anxiety.  Please continue to take medications as prescribed.  Please continue to follow low salt diet and ambulate as tolerated.

## 2024-08-07 NOTE — Progress Notes (Signed)
 Subjective:   Sandra Henry is a 78 y.o. who presents for a Medicare Wellness preventive visit.  As a reminder, Annual Wellness Visits don't include a physical exam, and some assessments may be limited, especially if this visit is performed virtually. We may recommend an in-person follow-up visit with your provider if needed.  Visit Complete: Virtual I connected with  Sandra Henry on 08/07/24 by a audio enabled telemedicine application and verified that I am speaking with the correct person using two identifiers.  Patient Location: Home  Provider Location: Office/Clinic  I discussed the limitations of evaluation and management by telemedicine. The patient expressed understanding and agreed to proceed.  Vital Signs: Because this visit was a virtual/telehealth visit, some criteria may be missing or patient reported. Any vitals not documented were not able to be obtained and vitals that have been documented are patient reported.  VideoDeclined- This patient declined Librarian, academic. Therefore the visit was completed with audio only.  Persons Participating in Visit: Patient.  AWV Questionnaire: No: Patient Medicare AWV questionnaire was not completed prior to this visit.  Cardiac Risk Factors include: advanced age (>31men, >54 women);hypertension;Other (see comment), Risk factor comments: PSVT     Objective:    Today's Vitals   08/07/24 1613 08/07/24 1614  BP: 122/82   Weight: 120 lb (54.4 kg)   Height: 5' 4 (1.626 m)   PainSc:  0-No pain   Body mass index is 20.6 kg/m.     08/07/2024    3:05 PM 03/16/2024    6:42 AM 01/30/2021    8:47 AM 09/10/2016    6:10 AM 07/12/2016    7:41 PM 02/16/2014   12:02 PM  Advanced Directives  Does Patient Have a Medical Advance Directive? No Yes Yes No  No  Patient has advance directive, copy not in chart   Type of Advance Directive  Healthcare Power of [place];Living will    Living will;Healthcare  Power of Attorney   Does patient want to make changes to medical advance directive?      No change requested   Would patient like information on creating a medical advance directive? No - Patient declined          Data saved with a previous flowsheet row definition    Current Medications (verified) Outpatient Encounter Medications as of 08/07/2024  Medication Sig   amLODipine  (NORVASC ) 5 MG tablet Take 1 tablet (5 mg total) by mouth daily. (Patient taking differently: Take 5 mg by mouth in the morning and at bedtime.)   Ascorbic Acid (VITAMIN C) 1000 MG tablet Take 1,000 mg by mouth daily.   aspirin 81 MG tablet Take 81 mg by mouth daily.   Calcium -Magnesium-Vitamin D  600-40-500 MG-MG-UNIT TB24 Take 1 tablet by mouth 2 (two) times daily.   cholecalciferol (VITAMIN D ) 400 UNITS TABS tablet Take 400 Units by mouth 2 (two) times daily.   Coenzyme Q10 (CO Q 10 PO) Take by mouth.   estradiol (ESTRACE) 0.1 MG/GM vaginal cream Place vaginally.   FIBER PO Take by mouth.   Garlic 10 MG CAPS Take 10 mg by mouth daily.   Glucosamine-Chondroitin 500-400 MG CAPS Take 2 tablets by mouth daily.   LORazepam  (ATIVAN ) 1 MG tablet Take 1 tablet (1 mg total) by mouth 2 (two) times daily as needed for anxiety.   losartan  (COZAAR ) 25 MG tablet Take 1 tablet (25 mg total) by mouth daily.   lubiprostone (AMITIZA) 8 MCG capsule Take 1 capsule (  8 mcg total) by mouth 2 (two) times daily with a meal.   Meclizine HCl 25 MG CHEW Chew 25 mg by mouth.   metoprolol  succinate (TOPROL -XL) 25 MG 24 hr tablet Take 1.5 tablets (37.5 mg total) by mouth 2 (two) times daily.   Multiple Vitamin (MULTIVITAMIN) tablet Take 1 tablet by mouth daily.   Netarsudil-Latanoprost (ROCKLATAN) 0.02-0.005 % SOLN Place 1 drop into both eyes at bedtime.   Omega-3 Fatty Acids (FISH OIL) 1000 MG CAPS Take 1,000 mg by mouth daily.   ondansetron  (ZOFRAN ) 4 MG tablet Take 1 tablet (4 mg total) by mouth 2 (two) times daily.   pantoprazole  (PROTONIX )  40 MG tablet Take 1 tablet (40 mg total) by mouth daily.   rosuvastatin  (CRESTOR ) 20 MG tablet Take 1 tablet (20 mg total) by mouth daily.   SENNA PO Take 2 tablets by mouth.   senna-docusate (SENOKOT S) 8.6-50 MG tablet Take 1 tablet by mouth at bedtime.   timolol  (TIMOPTIC ) 0.5 % ophthalmic solution Place 1 drop into both eyes every morning.   No facility-administered encounter medications on file as of 08/07/2024.    Allergies (verified) Codeine, Penicillins, and Sulfa antibiotics   History: Past Medical History:  Diagnosis Date   Dental bridge present    upper   Dental crowns present    Dizziness and giddiness    Glaucoma    Hypercalcemia    Hypertension    under control with med., has been on med. x 4 yr.   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   index, middle and ring fingers   Past Surgical History:  Procedure Laterality Date   BREAST BIOPSY Left    CATARACT EXTRACTION W/ INTRAOCULAR LENS IMPLANT Bilateral    COLONOSCOPY N/A 01/30/2021   Procedure: COLONOSCOPY;  Surgeon: Golda Claudis PENNER, MD;  [facility];  Service: Endoscopy;  Laterality: N/A;  am   TRIGGER FINGER RELEASE Right 02/20/2014   Procedure: RELEASE A-1 PULLEY RIGHT INDEX,  MIDDLE AND RING FINGERS ;  Surgeon: Arley JONELLE Curia, MD;  Location: [facility];  Service: Orthopedics;  Laterality: Right;   TUBAL LIGATION     laparoscopic   Family History  Problem Relation Age of Onset   Stroke Mother    Migraines Mother    Stroke Father    Heart attack Brother    Stroke Sister    Social History   Socioeconomic History   Marital status: Married    Spouse name: Not on file   Number of children: Not on file   Years of education: Not on file   Highest education level: Not on file  Occupational History   Occupation: medical receptionist    Employer: [employer]  Tobacco Use   Smoking status: Never    Smokeless tobacco: Never  Vaping Use   Vaping status: Never Used  Substance and Sexual Activity   Alcohol use: No   Drug use: No   Sexual activity: Not on file  Other Topics Concern   Not on file  Social History Narrative   Lives at home w/ her husband   Left-handed   Caffeine: occasional soft drink   Social Drivers of Corporate Investment Banker Strain: Low Risk  (08/07/2024)   Overall Financial Resource Strain (CARDIA)    Difficulty of Paying Living Expenses: Not hard at all  Food Insecurity: No Food Insecurity (08/07/2024)  Hunger Vital Sign    Worried About Running Out of Food in the Last Year: Never true    Ran Out of Food in the Last Year: Never true  Transportation Needs: No Transportation Needs (08/07/2024)   PRAPARE - Administrator, Civil Service (Medical): No    Lack of Transportation (Non-Medical): No  Physical Activity: Sufficiently Active (08/07/2024)   Exercise Vital Sign    Days of Exercise per Week: 7 days    Minutes of Exercise per Session: 30 min  Recent Concern: Physical Activity - Inactive (06/25/2024)   Received from [facility]   Exercise Vital Sign    On average, how many days per week do you engage in moderate to strenuous exercise (like a brisk walk)?: 0 days    On average, how many minutes do you engage in exercise at this level?: 0 min  Stress: No Stress Concern Present (08/07/2024)   [employer] of Occupational Health - Occupational Stress Questionnaire    Feeling of Stress: Not at all  Social Connections: Socially Integrated (08/07/2024)   Social Connection and Isolation Panel    Frequency of Communication with Friends and Family: More than three times a week    Frequency of Social Gatherings with Friends and Family: More than three times a week    Attends Religious Services: More than 4 times per year    Active Member of [employer] or Organizations: Yes    Attends Engineer, Structural: More than 4 times per year     Marital Status: Married  Recent Concern: Social Connections - Moderately Isolated (06/25/2024)   [facility]   Social Connection and Isolation Panel    In a typical week, how many times do you talk on the phone with family, friends, or neighbors?: More than three times a week    How often do you get together with friends or relatives?: More than three times a week    How often do you attend church or religious services?: Never    Do you belong to any clubs or organizations such as church groups, unions, fraternal or athletic groups, or school groups?: No    How often do you attend meetings of the clubs or organizations you belong to?: Never    Are you married, widowed, divorced, separated, never married, or living with a partner?: Married    Tobacco Counseling Counseling given: Yes    Clinical Intake:  Pre-visit preparation completed: Yes  Pain : No/denies pain Pain Score: 0-No pain     BMI - recorded: 20.6 Nutritional Status: BMI of 19-24  Normal Nutritional Risks: None Diabetes: No  Lab Results  Component Value Date   HGBA1C 6.1 (H) 01/03/2024     How often do you need to have someone help you when you read instructions, pamphlets, or other written materials from your doctor or pharmacy?: 1 - Never  Interpreter Needed?: No  Information entered by :: Alonni Heimsoth W CMA (AAMA)   Activities of Daily Living     08/07/2024    4:15 PM  In your present state of health, do you have any difficulty performing the following activities:  Hearing? 0  Vision? 0  Difficulty concentrating or making decisions? 0  Walking or climbing stairs? 0  Dressing or bathing? 0  Doing errands, shopping? 0  Preparing Food and eating ? N  Using the Toilet? N  In the past six months, have you accidently leaked urine? N  Do  you have problems with loss of bowel control? N  Managing your Medications? N  Managing your Finances? N  Housekeeping or managing your Housekeeping? N     Patient Care Team: Tobie Suzzane POUR, MD as PCP - General (Internal Medicine) Stacia Diannah SQUIBB, MD as PCP - Cardiology (Cardiology) Arlana Arnt, MD as Consulting Physician (Otolaryngology)  I have updated your Care Teams any recent Medical Services you may have received from other providers in the past year.     Assessment:   This is a routine wellness examination for Sandra Henry.  Hearing/Vision screen Hearing Screening - Comments:: Patient denies any hearing difficulties.   Vision Screening - Comments:: Wears rx glasses - up to date with routine eye exams with  Lorrene Devonshire and Dr. 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 0 0 0 0 0  Number falls in past yr: 0 0 0 0 0  Injury with Fall? 0 0 0 0 0  Risk for fall due to : No Fall Risks No Fall Risks No Fall Risks No Fall Risks No Fall Risks  Follow up Falls evaluation completed;Education provided;Falls prevention discussed Falls evaluation completed Falls evaluation completed Falls evaluation completed Falls evaluation completed    MEDICARE RISK AT HOME:  Medicare Risk at Home Any stairs in or around the home?: No If so, are there any without handrails?: No Home free of loose throw rugs in walkways, pet beds, electrical cords, etc?: Yes Adequate lighting in your home to reduce risk of falls?: Yes Life alert?: No Use of a cane, walker or w/c?: No Grab bars in the  bathroom?: Yes Shower chair or bench in shower?: Yes Elevated toilet seat or a handicapped toilet?: No  TIMED UP AND GO:  Was the test performed?  No  Cognitive Function: 6CIT completed        08/07/2024    4:16 PM  6CIT Screen  What Year? 0 points  What month? 0 points  What time? 0 points  Count back from 20 0 points  Months in reverse 0 points  Repeat phrase 0 points  Total Score 0 points    Immunizations Immunization History  Administered Date(s) Administered   Fluad Trivalent(High Dose 65+) 08/09/2023   INFLUENZA, HIGH DOSE SEASONAL PF 07/28/2018, 07/08/2019   Influenza-Unspecified 08/12/2014, 07/12/2016, 08/12/2018   PNEUMOCOCCAL CONJUGATE-20 08/19/2018   Pneumococcal Conjugate-13 08/19/2018   Zoster Recombinant(Shingrix) 01/14/2017, 05/09/2019    Screening Tests Health Maintenance  Topic Date Due   COVID-19 Vaccine (1) Never done   Hepatitis C Screening  Never done   DTaP/Tdap/Td (1 - Tdap) Never done   DEXA SCAN  06/11/2024   Influenza Vaccine  01/09/2025 (Originally 05/12/2024)   Mammogram  07/13/2025   Medicare Annual Wellness (AWV)  08/07/2025   Pneumococcal Vaccine: 50+ Years  Completed   Zoster Vaccines- Shingrix  Completed   Meningococcal B Vaccine  Aged Out   Colonoscopy  Discontinued    Health Maintenance Health Maintenance Due  Topic Date Due   COVID-19 Vaccine (1) Never done   Hepatitis C Screening  Never done   DTaP/Tdap/Td (1 - Tdap) Never done   DEXA SCAN  06/11/2024   Health Maintenance Items Addressed: Patient is scheduled to have her osteoporosis screening on Sep 28, 2024 at [facility] in [place]. Received the flu vaccine at [facility] on August 03, 2024. Chart Updated  Additional Screening:  Vision Screening: Recommended annual ophthalmology exams for early detection of glaucoma and other disorders of the eye. Would you like a referral to an eye doctor? No    Dental Screening: Recommended annual dental exams for proper  oral hygiene  Community Resource Referral / Chronic Care Management: CRR required this visit?  No   CCM required this visit?  No   Plan:    I have personally reviewed and noted the following in the patient's chart:   Medical and social history Use of alcohol, tobacco or illicit drugs  Current medications and supplements including opioid prescriptions. Patient is not currently taking opioid prescriptions. Functional ability and status Nutritional status Physical activity Advanced directives List of other physicians Hospitalizations, surgeries, and ER visits in previous 12 months Vitals Screenings to include cognitive, depression, and falls Referrals and appointments  In addition, I have reviewed and discussed with patient certain preventive protocols, quality metrics, and best practice recommendations. A written personalized care plan for preventive services as well as general preventive health recommendations were provided to patient.   Madissen Wyse, CMA   08/07/2024   After Visit Summary: (MyChart) Due to this being a telephonic visit, the after visit summary with patients personalized plan was offered to patient via MyChart   Notes: Nothing significant to report at this time.

## 2024-08-07 NOTE — Telephone Encounter (Signed)
 Copied from CRM [id number]. Topic: General - Other >> Aug 07, 2024  3:18 PM Corin V wrote: Reason for CRM: Patient stated she was disconnected during her annual wellness visit. Unable to connect to CAL. Please call patient back at [phone]

## 2024-08-07 NOTE — Assessment & Plan Note (Signed)
 BP Readings from Last 1 Encounters:  08/07/24 122/82   Well-controlled with amlodipine  5 mg QD, losartan  25 mg QD and metoprolol  37.5 mg BID Counseled for compliance with the medications Advised DASH diet and ambulate as tolerated

## 2024-08-08 ENCOUNTER — Ambulatory Visit: Admitting: Internal Medicine

## 2024-08-09 ENCOUNTER — Ambulatory Visit: Attending: Internal Medicine | Admitting: Internal Medicine

## 2024-08-09 ENCOUNTER — Encounter: Payer: Self-pay | Admitting: Internal Medicine

## 2024-08-09 VITALS — BP 128/66 | HR 68 | Ht 64.0 in | Wt 120.8 lb

## 2024-08-09 DIAGNOSIS — I6529 Occlusion and stenosis of unspecified carotid artery: Secondary | ICD-10-CM | POA: Diagnosis not present

## 2024-08-09 DIAGNOSIS — G459 Transient cerebral ischemic attack, unspecified: Secondary | ICD-10-CM | POA: Diagnosis not present

## 2024-08-09 DIAGNOSIS — I773 Arterial fibromuscular dysplasia: Secondary | ICD-10-CM | POA: Diagnosis not present

## 2024-08-09 DIAGNOSIS — I10 Essential (primary) hypertension: Secondary | ICD-10-CM

## 2024-08-09 DIAGNOSIS — I771 Stricture of artery: Secondary | ICD-10-CM | POA: Diagnosis not present

## 2024-08-09 MED ORDER — CARVEDILOL 3.125 MG PO TABS: 3.1250 mg | ORAL_TABLET | Freq: Two times a day (BID) | ORAL | 3 refills | Status: DC

## 2024-08-09 NOTE — Progress Notes (Signed)
 Cardiology Office Note  Date: 08/09/2024   ID: Sandra Henry, DOB 1946/03/20, MRN [id number]  PCP:  Sandra Suzzane POUR, MD  Cardiologist:  Diannah SHAUNNA Maywood, MD Electrophysiologist:  None   History of Present Illness: Sandra Henry is a 78 y.o. female  Here for follow-up visit of paroxysmal SVT.  She was previously followed by Dr. Burnard for the management of paroxysmal SVT (atrial tachycardia) that has been very well-controlled on metoprolol  with no further episodes.  Patient is here for follow-up visit of TIA.  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  She is here to discuss/know more about fibromuscular dysplasia.  Records from Care Everywhere reviewed.  Patient had a TIA admission at [facility].  Echo, NM stress test were within normal limits.  CTA head and neck was performed that showed moderate 50% stenosis of left internal carotid and right external carotid arteries.  There was a beaded appearance of the bilateral distal cervical internal carotid artery suspicious for fibromuscular dysplasia.  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  Labs reviewed.  She previously had CKD stage III but now her kidney function significantly improved.  She follows up with nephrologist.  She does not have any angina or DOE.  She does have persistent nausea for the last few months and also decreased p.o. intake.  She follows with GI at [facility].  Due to dysphagia, loss of appetite/nausea and constipation, she will be scheduled for upper endoscopy with dilatation after neurology clearance.  She denies having any angina or DOE.  No dizziness, syncope, palpitations.  Past Medical History:  Diagnosis Date   Dental bridge present    upper   Dental crowns present    Dizziness and giddiness    Glaucoma    Hypercalcemia    Hypertension    under control with med., has  been on med. x 4 yr.   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   index, middle and ring fingers    Past Surgical History:  Procedure Laterality Date   BREAST BIOPSY Left    CATARACT EXTRACTION W/ INTRAOCULAR LENS IMPLANT Bilateral    COLONOSCOPY N/A 01/30/2021   Procedure: COLONOSCOPY;  Surgeon: Golda Claudis PENNER, MD;  [facility];  Service: Endoscopy;  Laterality: N/A;  am   TRIGGER FINGER RELEASE Right 02/20/2014   Procedure: RELEASE A-1 PULLEY RIGHT INDEX,  MIDDLE AND RING FINGERS ;  Surgeon: Arley JONELLE Curia, MD;  Location: [facility];  Service: Orthopedics;  Laterality: Right;   TUBAL LIGATION     laparoscopic    Current Outpatient Medications  Medication Sig Dispense Refill   Ascorbic Acid (VITAMIN C) 1000 MG tablet Take 1,000 mg by mouth daily.     aspirin 81 MG tablet Take 81 mg by mouth daily.     Calcium -Magnesium-Vitamin D  600-40-500 MG-MG-UNIT TB24 Take 1 tablet by mouth 2 (two) times daily.     cholecalciferol (VITAMIN D ) 400 UNITS TABS tablet Take 400 Units by mouth 2 (two) times daily.     Coenzyme Q10 (CO Q 10 PO) Take by mouth.     estradiol (ESTRACE) 0.1 MG/GM vaginal cream Place vaginally.     FIBER PO Take by mouth.     Garlic 10 MG CAPS  Take 10 mg by mouth daily.     Glucosamine-Chondroitin 500-400 MG CAPS Take 2 tablets by mouth daily.     LORazepam  (ATIVAN ) 1 MG tablet Take 1 tablet (1 mg total) by mouth 2 (two) times daily as needed for anxiety. 60 tablet 2   losartan  (COZAAR ) 25 MG tablet Take 1 tablet (25 mg total) by mouth daily. 90 tablet 3   lubiprostone (AMITIZA) 8 MCG capsule Take 1 capsule (8 mcg total) by mouth 2 (two) times daily with a meal. 180 capsule 1   Meclizine HCl 25 MG CHEW Chew 25 mg by mouth.     metoprolol  succinate (TOPROL -XL) 25 MG 24 hr tablet Take 1.5 tablets (37.5 mg total) by mouth 2 (two) times daily. 90 tablet 2   Multiple Vitamin  (MULTIVITAMIN) tablet Take 1 tablet by mouth daily.     Netarsudil-Latanoprost (ROCKLATAN) 0.02-0.005 % SOLN Place 1 drop into both eyes at bedtime.     Omega-3 Fatty Acids (FISH OIL) 1000 MG CAPS Take 1,000 mg by mouth daily.     ondansetron  (ZOFRAN ) 4 MG tablet Take 1 tablet (4 mg total) by mouth 2 (two) times daily. 20 tablet 0   pantoprazole  (PROTONIX ) 40 MG tablet Take 1 tablet (40 mg total) by mouth daily. 60 tablet 3   rosuvastatin  (CRESTOR ) 20 MG tablet Take 1 tablet (20 mg total) by mouth daily. 90 tablet 1   senna-docusate (SENOKOT S) 8.6-50 MG tablet Take 1 tablet by mouth at bedtime. 30 tablet 0   timolol  (TIMOPTIC ) 0.5 % ophthalmic solution Place 1 drop into both eyes every morning. 10 mL 0   amLODipine  (NORVASC ) 5 MG tablet Take 1 tablet (5 mg total) by mouth daily. (Patient taking differently: Take 5 mg by mouth in the morning and at bedtime.) 90 tablet 3   SENNA PO Take 2 tablets by mouth. (Patient not taking: Reported on 08/09/2024)     No current facility-administered medications for this visit.   Allergies:  Codeine, Penicillins, and Sulfa antibiotics   Social History: The patient  reports that she has never smoked. She has never used smokeless tobacco. She reports that she does not drink alcohol and does not use drugs.   Family History: The patient's family history includes Heart attack in her brother; Migraines in her mother; Stroke in her father, mother, and sister.   ROS:  Please see the history of present illness. Otherwise, complete review of systems is positive for none  All other systems are reviewed and negative.   Physical Exam: VS:  BP 128/66 (BP Location: Right Arm)   Pulse 68   Ht 5' 4 (1.626 m)   Wt 120 lb 12.8 oz (54.8 kg)   SpO2 96%   BMI 20.74 kg/m , BMI Body mass index is 20.74 kg/m.  Wt Readings from Last 3 Encounters:  08/09/24 120 lb 12.8 oz (54.8 kg)  08/07/24 120 lb (54.4 kg)  08/07/24 120 lb 12.8 oz (54.8 kg)    General: Patient appears  comfortable at rest. HEENT: Conjunctiva and lids normal, oropharynx clear with moist mucosa. Neck: Supple, no elevated JVP or carotid bruits, no thyromegaly. Lungs: Clear to auscultation, nonlabored breathing at rest. Cardiac: Regular rate and rhythm, no S3 or significant systolic murmur, no pericardial rub. Abdomen: Soft, nontender, no hepatomegaly, bowel sounds present, no guarding or rebound. Extremities: No pitting edema Skin: Warm and dry. Musculoskeletal: No kyphosis. Neuropsychiatric: Alert and oriented x3, affect grossly appropriate.  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  Will obtain ultrasound carotid arteries and ultrasound arterial Doppler of right upper extremity in 1 year.  Does not have any weakness in the right upper extremity or dizziness with exertion.  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  Due to HTN currently on multiple antihypertensive medications and recent AKI, it is not unreasonable to obtain ultrasound renal duplex.  Paroxysmal SVT (atrial tachycardia): Palpitations controlled on metoprolol .  However blood pressures elevated at night.  Switch metoprolol  to carvedilol due to elevated nocturnal blood pressures at home.  HTN, partially controlled: I reviewed home blood pressures, elevated blood pressures at night.  Switch metoprolol  to  carvedilol 3.125 mg twice daily.  Continue losartan  25 mg once daily.  History of TIA in 2025: Obtain 30-day event monitor.  Encouraged to purchase smart watch with EKG functionality if event monitor is normal.  Preop card risk stratification for EGD: Patient requesting for preop clearance.  No angina or DOE.  Echo and stress normal.  Low risk for any perioperative cardiac complications.   15 minutes spent in reviewing prior medical records (from Care Everywhere), more than 3 labs, prior reports.  30 minutes spent discussing the above problems with the patient, her husband and documentation.     Medication Adjustments/Labs and Tests Ordered: Current medicines are reviewed at length with the patient today.  Concerns regarding medicines are outlined above.    Disposition:  Follow up 1 year  Signed Shirlyn Savin Arleta Maywood, MD, 08/09/2024 2:37 PM    [facility] [address] [place], [place], [place] [address]

## 2024-08-09 NOTE — Patient Instructions (Addendum)
 Medication Instructions:   STOP Metoprolol   START Coreg 3.125 mg Twice a day  Labwork: None today  Testing/Procedures: Your physician has requested that you have a renal artery duplex. During this test, an ultrasound is used to evaluate blood flow to the kidneys. Allow one hour for this exam. Do not eat after midnight the day before and avoid carbonated beverages. Take your medications as you usually do.    Your physician has recommended that you wear an event monitor. Event monitors are medical devices that record the heart's electrical activity. Doctors most often us  these monitors to diagnose arrhythmias. Arrhythmias are problems with the speed or rhythm of the heartbeat. The monitor is a small, portable device. You can wear one while you do your normal daily activities. This is usually used to diagnose what is causing palpitations/syncope (passing out).   Follow-Up: 1 year  Any Other Special Instructions Will Be Listed Below (If Applicable).  If you need a refill on your cardiac medications before your next appointment, please call your pharmacy.

## 2024-08-10 ENCOUNTER — Telehealth: Payer: Self-pay | Admitting: Neurology

## 2024-08-10 ENCOUNTER — Encounter: Payer: Self-pay | Admitting: Neurology

## 2024-08-10 ENCOUNTER — Ambulatory Visit: Admitting: Neurology

## 2024-08-10 VITALS — BP 130/78 | HR 66 | Resp 16 | Ht 64.0 in | Wt 120.8 lb

## 2024-08-10 DIAGNOSIS — Z823 Family history of stroke: Secondary | ICD-10-CM

## 2024-08-10 DIAGNOSIS — G459 Transient cerebral ischemic attack, unspecified: Secondary | ICD-10-CM

## 2024-08-10 DIAGNOSIS — E7849 Other hyperlipidemia: Secondary | ICD-10-CM

## 2024-08-10 DIAGNOSIS — R29898 Other symptoms and signs involving the musculoskeletal system: Secondary | ICD-10-CM | POA: Diagnosis not present

## 2024-08-10 DIAGNOSIS — G3184 Mild cognitive impairment, so stated: Secondary | ICD-10-CM

## 2024-08-10 MED ORDER — EZETIMIBE 10 MG PO TABS: 10.0000 mg | ORAL_TABLET | Freq: Every day | ORAL | 3 refills | Status: AC

## 2024-08-10 NOTE — Assessment & Plan Note (Signed)
 Was evaluated by GI recently Did not tolerate Linzess Better with Amitiza 8 mg BID now Takes MiraLAX  as needed for constipation as well Advised to maintain adequate hydration

## 2024-08-10 NOTE — Progress Notes (Signed)
 [facility] [address] [place]. [place] [address] [phone]       OFFICE CONSULT NOTE  Ms. Asberry RAMAN Backer Date of Birth:  10-Sep-1946 Medical Record Number:  [id number]   Referring MD: Hudson Blanch  Reason for Referral: TIA  HPI: Sandra Henry is a 78 year old pleasant Caucasian lady seen today for initial office consultation visit for TIA.  She is accompanied by her husband.  History is obtained from them and review of electronic medical records.  I personally reviewed pertinent available imaging films in PACS and in Care Everywhere.  She has past medical history of hypertension, glaucoma and allergies.  Patient was seen in the ER at [facility] on 05/08/2024 when she woke up in the morning at 2:30 AM and was unable to get up due to bilateral leg weakness.  She denied any speech difficulties.  She initially thought her symptoms were due to allergy to MiraLAX .  The first time she can go to the hospital but when that happened a second time a few weeks apart it was much worse than this time they decided to go to the ER.  Patient denies any focal right-sided weakness to me but as per ER notes she had some right arm heaviness and tingling as well as tingling of the lips on both sides.  Symptoms appeared to resolve by the time she reached the ER.  CT scan of the head was unremarkable MRI scan showed no acute infarct but only mild changes of small vessel disease and generalized atrophy.  Hemoglobin A1c was 6.0.  LDL cholesterol was 87 mg percent.  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  Patient does have very strong family Struve strokes in both her parents as well as 2 sisters and 1 brother.  She was started on aspirin which she is tolerating well without bruising or bleeding.  States her blood  pressure is under good control and today it is 130/78.  She is tolerating Crestor  well without muscle aches and pains.  She has not had any further recurrent stroke or TIA-like symptoms.  She is quite independent in activities of daily living.  She walks quite well.  She works 2-1/2 days a week at [employer] in [place].  On inquiry she went to her clinic due to mild short-term memory difficulties that she has had for years.  This is no interval with day-to-day living but she finds it frustrating.  She has to write things down.  She is still managing all of her affairs.  ROS:   14 system review of systems is positive for memory difficulties, weakness, difficulty walking all other systems negative  PMH:  Past Medical History:  Diagnosis Date   Dental bridge present    upper   Dental crowns present    Dizziness and giddiness    Glaucoma    Hypercalcemia    Hypertension    under control with med., has been on med. x 4 yr.   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   index, middle and ring fingers    Social History:  Social History   Socioeconomic History   Marital status: Married    Spouse name: Not on file   Number  of children: Not on file   Years of education: Not on file   Highest education level: Not on file  Occupational History   Occupation: medical receptionist    Employer: [employer]  Tobacco Use   Smoking status: Never   Smokeless tobacco: Never  Vaping Use   Vaping status: Never Used  Substance and Sexual Activity   Alcohol use: No   Drug use: No   Sexual activity: Not on file  Other Topics Concern   Not on file  Social History Narrative   Lives at home w/ her husband   Left-handed   Caffeine: occasional soft drink   Social Drivers of Corporate Investment Banker Strain: Low Risk  (08/07/2024)   Overall Financial Resource Strain (CARDIA)    Difficulty of Paying Living Expenses: Not  hard at all  Food Insecurity: No Food Insecurity (08/07/2024)   Hunger Vital Sign    Worried About Running Out of Food in the Last Year: Never true    Ran Out of Food in the Last Year: Never true  Transportation Needs: No Transportation Needs (08/07/2024)   PRAPARE - Administrator, Civil Service (Medical): No    Lack of Transportation (Non-Medical): No  Physical Activity: Sufficiently Active (08/07/2024)   Exercise Vital Sign    Days of Exercise per Week: 7 days    Minutes of Exercise per Session: 30 min  Recent Concern: Physical Activity - Inactive (06/25/2024)   Received from [facility]   Exercise Vital Sign    On average, how many days per week do you engage in moderate to strenuous exercise (like a brisk walk)?: 0 days    On average, how many minutes do you engage in exercise at this level?: 0 min  Stress: No Stress Concern Present (08/07/2024)   [employer] of Occupational Health - Occupational Stress Questionnaire    Feeling of Stress: Not at all  Social Connections: Socially Integrated (08/07/2024)   Social Connection and Isolation Panel    Frequency of Communication with Friends and Family: More than three times a week    Frequency of Social Gatherings with Friends and Family: More than three times a week    Attends Religious Services: More than 4 times per year    Active Member of [employer] or Organizations: Yes    Attends Engineer, Structural: More than 4 times per year    Marital Status: Married  Recent Concern: Social Connections - Moderately Isolated (06/25/2024)   Received from [facility]   Social Connection and Isolation Panel    In a typical week, how many times do you talk on the phone with family, friends, or neighbors?: More than three times a week    How often do you get together with friends or relatives?: More than three times a week    How often do you attend church or religious services?: Never    Do you belong to any clubs or  organizations such as church groups, unions, fraternal or athletic groups, or school groups?: No    How often do you attend meetings of the clubs or organizations you belong to?: Never    Are you married, widowed, divorced, separated, never married, or living with a partner?: Married  Intimate Partner Violence: Not At Risk (08/07/2024)   Humiliation, Afraid, Rape, and Kick questionnaire    Fear of Current or Ex-Partner: No    Emotionally Abused: No    Physically Abused: No  Sexually Abused: No    Medications:   Current Outpatient Medications on File Prior to Visit  Medication Sig Dispense Refill   amLODipine  (NORVASC ) 5 MG tablet Take 1 tablet (5 mg total) by mouth daily. (Patient taking differently: Take 5 mg by mouth in the morning and at bedtime.) 90 tablet 3   Ascorbic Acid (VITAMIN C) 1000 MG tablet Take 1,000 mg by mouth daily.     aspirin 81 MG tablet Take 81 mg by mouth daily.     Calcium -Magnesium-Vitamin D  600-40-500 MG-MG-UNIT TB24 Take 1 tablet by mouth 2 (two) times daily.     carvedilol (COREG) 3.125 MG tablet Take 1 tablet (3.125 mg total) by mouth 2 (two) times daily. 180 tablet 3   cholecalciferol (VITAMIN D ) 400 UNITS TABS tablet Take 400 Units by mouth 2 (two) times daily.     Coenzyme Q10 (CO Q 10 PO) Take by mouth.     estradiol (ESTRACE) 0.1 MG/GM vaginal cream Place vaginally.     FIBER PO Take by mouth.     Garlic 10 MG CAPS Take 10 mg by mouth daily.     Glucosamine-Chondroitin 500-400 MG CAPS Take 2 tablets by mouth daily.     LORazepam  (ATIVAN ) 1 MG tablet Take 1 tablet (1 mg total) by mouth 2 (two) times daily as needed for anxiety. 60 tablet 2   losartan  (COZAAR ) 25 MG tablet Take 1 tablet (25 mg total) by mouth daily. 90 tablet 3   lubiprostone (AMITIZA) 8 MCG capsule Take 1 capsule (8 mcg total) by mouth 2 (two) times daily with a meal. 180 capsule 1   Meclizine HCl 25 MG CHEW Chew 25 mg by mouth.     Multiple Vitamin (MULTIVITAMIN) tablet Take 1 tablet  by mouth daily.     Netarsudil-Latanoprost (ROCKLATAN) 0.02-0.005 % SOLN Place 1 drop into both eyes at bedtime.     Omega-3 Fatty Acids (FISH OIL) 1000 MG CAPS Take 1,000 mg by mouth daily.     ondansetron  (ZOFRAN ) 4 MG tablet Take 1 tablet (4 mg total) by mouth 2 (two) times daily. 20 tablet 0   pantoprazole  (PROTONIX ) 40 MG tablet Take 1 tablet (40 mg total) by mouth daily. 60 tablet 3   rosuvastatin  (CRESTOR ) 20 MG tablet Take 1 tablet (20 mg total) by mouth daily. 90 tablet 1   SENNA PO Take 2 tablets by mouth.     senna-docusate (SENOKOT S) 8.6-50 MG tablet Take 1 tablet by mouth at bedtime. 30 tablet 0   timolol  (TIMOPTIC ) 0.5 % ophthalmic solution Place 1 drop into both eyes every morning. 10 mL 0   No current facility-administered medications on file prior to visit.    Allergies:   Allergies  Allergen Reactions   Sulfa Antibiotics Rash, Dermatitis and Nausea Only    Other Reaction(s): Not available   Polyethylene Glycol 3350  Other (See Comments)    Numbness in legs   Codeine Nausea Only    Other Reaction(s): Not available   Penicillins Rash    AS A CHILD  Other Reaction(s): Not available    Physical Exam General: well developed, well nourished, seated, in no evident distress Head: head normocephalic and atraumatic.   Neck: supple with no carotid or supraclavicular bruits Cardiovascular: regular rate and rhythm, no murmurs Musculoskeletal: no deformity Skin:  no rash/petichiae Vascular:  Normal pulses all extremities  Neurologic Exam Mental Status: Awake and fully alert. Oriented to place and time. Recent and remote memory intact. Attention span, concentration  and fund of knowledge appropriate. Mood and affect appropriate.  Cranial Nerves: Fundoscopic exam reveals sharp disc margins. Pupils equal, briskly reactive to light. Extraocular movements full without nystagmus. Visual fields full to confrontation. Hearing intact. Facial sensation intact. Face, tongue, palate  moves normally and symmetrically.  Motor: Normal bulk and tone. Normal strength in all tested extremity muscles. Sensory.: intact to touch , pinprick , position and vibratory sensation.  Coordination: Rapid alternating movements normal in all extremities. Finger-to-nose and heel-to-shin performed accurately bilaterally. Gait and Station: Arises from chair without difficulty. Stance is normal. Gait demonstrates normal stride length and balance . Able to heel, toe and tandem walk with moderate difficulty.  Reflexes: 1+ and symmetric. Toes downgoing.   NIHSS  0 Modified Rankin  0   ASSESSMENT: 78 year old Caucasian lady with 2 transient episodes of bilateral leg weakness possible TIAs.  Vascular risk factors of hypertension hyperlipidemia and very strong family history of strokes.  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     PLAN:I had a long d/w patient and her husband about her recent TIA, risk for recurrent stroke/TIAs, personally independently reviewed imaging studies and stroke evaluation results and answered questions.Continue aspirin 81 mg daily  for secondary stroke prevention and maintain strict control of hypertension with blood pressure goal below 130/90, diabetes with hemoglobin A1c goal below 6.5% and lipids with LDL cholesterol goal below 70 mg/dL. I also advised the patient to eat a healthy diet with plenty of whole grains, cereals, fruits and vegetables, exercise regularly and maintain ideal body weight. Add zetia 10 mg daily for more optimal lipid control.  Patient also has mild cognitive impairment I encouraged her to increase participation in cognitively challenging activities like solving crossword puzzles, playing bridge and sudoku.  We also discussed memory compensation strategies.  Followup in the future with me in future as needed only    I personally spent a total of 50 minutes in the care of the patient today including getting/reviewing separately obtained  history, performing a medically appropriate exam/evaluation, counseling and educating, placing orders, referring and communicating with other health care professionals, documenting clinical information in the EHR, independently interpreting results, and coordinating care.       Eather Popp, MD Note: This document was prepared with digital dictation and possible smart phrase technology. Any transcriptional errors that result from this process are unintentional.

## 2024-08-10 NOTE — Assessment & Plan Note (Signed)
 Has intermittent palpitations, on metoprolol  Followed by cardiology

## 2024-08-10 NOTE — Assessment & Plan Note (Signed)
 Better controlled with pantoprazole  Has been evaluated by GI Zofran  as needed for nausea Maintain adequate hydration Avoid hot and spicy food

## 2024-08-10 NOTE — Assessment & Plan Note (Signed)
 Uncontrolled currently Takes Ativan  0.5 mg BID PDMP reviewed Adjusted dose of Ativan  to 1 mg BID, advised to take 0.5-1 mg BID PRN for anxiety

## 2024-08-10 NOTE — Telephone Encounter (Signed)
 Patient wanted to know if was to be on ezetimibe (ZETIA) 10 MG tablet. Informed patient Dr. Rosemarie sent in a prescription for Zetia. Patient stated that answering my question thank you.

## 2024-08-10 NOTE — Patient Instructions (Addendum)
 I had a long d/w patient and her husband about her recent TIA, risk for recurrent stroke/TIAs, personally independently reviewed imaging studies and stroke evaluation results and answered questions.Continue aspirin 81 mg daily  for secondary stroke prevention and maintain strict control of hypertension with blood pressure goal below 130/90, diabetes with hemoglobin A1c goal below 6.5% and lipids with LDL cholesterol goal below 70 mg/dL. I also advised the patient to eat a healthy diet with plenty of whole grains, cereals, fruits and vegetables, exercise regularly and maintain ideal body weight. Add zetia 10 mg daily for more optimal lipid control.  Patient also has mild cognitive impairment I encouraged her to increase participation in cognitively challenging activities like solving crossword puzzles, playing bridge and sudoku.  We also discussed memory compensation strategies.  Followup in the future with me in future as needed only  Memory Compensation Strategies  Use WARM strategy.  W= write it down  A= associate it  R= repeat it  M= make a mental note  2.   You can keep a Glass Blower/designer.  Use a 3-ring notebook with sections for the following: calendar, important names and phone numbers,  medications, doctors' names/phone numbers, lists/reminders, and a section to journal what you did  each day.   3.    Use a calendar to write appointments down.  4.    Write yourself a schedule for the day.  This can be placed on the calendar or in a separate section of the Memory Notebook.  Keeping a  regular schedule can help memory.  5.    Use medication organizer with sections for each day or morning/evening pills.  You may need help loading it  6.    Keep a basket, or pegboard by the door.  Place items that you need to take out with you in the basket or on the pegboard.  You may also want to  include a message board for reminders.  7.    Use sticky notes.  Place sticky notes with reminders in a  place where the task is performed.  For example:  turn off the  stove placed by the stove, lock the door placed on the door at eye level,  take your medications on  the bathroom mirror or by the place where you normally take your medications.  8.    Use alarms/timers.  Use while cooking to remind yourself to check on food or as a reminder to take your medicine, or as a  reminder to make a call, or as a reminder to perform another task, etc.  Stroke Prevention Some medical conditions and behaviors can lead to a higher chance of having a stroke. You can help prevent a stroke by eating healthy, exercising, not smoking, and managing any medical conditions you have. Stroke is a leading cause of functional impairment. Primary prevention is particularly important because a majority of strokes are first-time events. Stroke changes the lives of not only those who experience a stroke but also their family and other caregivers. How can this condition affect me? A stroke is a medical emergency and should be treated right away. A stroke can lead to brain damage and can sometimes be life-threatening. If a person gets medical treatment right away, there is a better chance of surviving and recovering from a stroke. What can increase my risk? The following medical conditions may increase your risk of a stroke: Cardiovascular disease. High blood pressure (hypertension). Diabetes. High cholesterol. Sickle cell disease. Blood clotting  disorders (hypercoagulable state). Obesity. Sleep disorders (obstructive sleep apnea). Other risk factors include: Being older than age 42. Having a history of blood clots, stroke, or mini-stroke (transient ischemic attack, TIA). Genetic factors, such as race, ethnicity, or a family history of stroke. Smoking cigarettes or using other tobacco products. Taking birth control pills, especially if you also use tobacco. Heavy use of alcohol or drugs, especially cocaine and  methamphetamine. Physical inactivity. What actions can I take to prevent this? Manage your health conditions High cholesterol levels. Eating a healthy diet is important for preventing high cholesterol. If cholesterol cannot be managed through diet alone, you may need to take medicines. Take any prescribed medicines to control your cholesterol as told by your health care provider. Hypertension. To reduce your risk of stroke, try to keep your blood pressure below 130/80. Eating a healthy diet and exercising regularly are important for controlling blood pressure. If these steps are not enough to manage your blood pressure, you may need to take medicines. Take any prescribed medicines to control hypertension as told by your health care provider. Ask your health care provider if you should monitor your blood pressure at home. Have your blood pressure checked every year, even if your blood pressure is normal. Blood pressure increases with age and some medical conditions. Diabetes. Eating a healthy diet and exercising regularly are important parts of managing your blood sugar (glucose). If your blood sugar cannot be managed through diet and exercise, you may need to take medicines. Take any prescribed medicines to control your diabetes as told by your health care provider. Get evaluated for obstructive sleep apnea. Talk to your health care provider about getting a sleep evaluation if you snore a lot or have excessive sleepiness. Make sure that any other medical conditions you have, such as atrial fibrillation or atherosclerosis, are managed. Nutrition Follow instructions from your health care provider about what to eat or drink to help manage your health condition. These instructions may include: Reducing your daily calorie intake. Limiting how much salt (sodium) you use to 1,500 milligrams (mg) each day. Using only healthy fats for cooking, such as olive oil, canola oil, or sunflower oil. Eating  healthy foods. You can do this by: Choosing foods that are high in fiber, such as whole grains, and fresh fruits and vegetables. Eating at least 5 servings of fruits and vegetables a day. Try to fill one-half of your plate with fruits and vegetables at each meal. Choosing lean protein foods, such as lean cuts of meat, poultry without skin, fish, tofu, beans, and nuts. Eating low-fat dairy products. Avoiding foods that are high in sodium. This can help lower blood pressure. Avoiding foods that have saturated fat, trans fat, and cholesterol. This can help prevent high cholesterol. Avoiding processed and prepared foods. Counting your daily carbohydrate intake.  Lifestyle If you drink alcohol: Limit how much you have to: 0-1 drink a day for women who are not pregnant. 0-2 drinks a day for men. Know how much alcohol is in your drink. In the [place] one drink equals one 12 oz bottle of beer ( ), one 5 oz glass of wine ( ), or one 1 oz glass of hard liquor (44mL). Do not use any products that contain nicotine or tobacco. These products include cigarettes, chewing tobacco, and vaping devices, such as e-cigarettes. If you need help quitting, ask your health care provider. Avoid secondhand smoke. Do not use drugs. Activity  Try to stay at a healthy weight. Get at least 30  minutes of exercise on most days, such as: Fast walking. Biking. Swimming. Medicines Take over-the-counter and prescription medicines only as told by your health care provider. Aspirin or blood thinners (antiplatelets or anticoagulants) may be recommended to reduce your risk of forming blood clots that can lead to stroke. Avoid taking birth control pills. Talk to your health care provider about the risks of taking birth control pills if: You are over 38 years old. You smoke. You get very bad headaches. You have had a blood clot. Where to find more information American Stroke Association: www.strokeassociation.org Get  help right away if: You or a loved one has any symptoms of a stroke. BE FAST is an easy way to remember the main warning signs of a stroke: B - Balance. Signs are dizziness, sudden trouble walking, or loss of balance. E - Eyes. Signs are trouble seeing or a sudden change in vision. F - Face. Signs are sudden weakness or numbness of the face, or the face or eyelid drooping on one side. A - Arms. Signs are weakness or numbness in an arm. This happens suddenly and usually on one side of the body. S - Speech. Signs are sudden trouble speaking, slurred speech, or trouble understanding what people say. T - Time. Time to call emergency services. Write down what time symptoms started. You or a loved one has other signs of a stroke, such as: A sudden, severe headache with no known cause. Nausea or vomiting. Seizure. These symptoms may represent a serious problem that is an emergency. Do not wait to see if the symptoms will go away. Get medical help right away. Call your local emergency services (911 in the [place] Do not drive yourself to the hospital. Summary You can help to prevent a stroke by eating healthy, exercising, not smoking, limiting alcohol intake, and managing any medical conditions you may have. Do not use any products that contain nicotine or tobacco. These include cigarettes, chewing tobacco, and vaping devices, such as e-cigarettes. If you need help quitting, ask your health care provider. Remember BE FAST for warning signs of a stroke. Get help right away if you or a loved one has any of these signs. This information is not intended to replace advice given to you by your health care provider. Make sure you discuss any questions you have with your health care provider. Document Revised: 08/31/2022 Document Reviewed: 08/31/2022 Elsevier Patient Education  2024 [employer].

## 2024-08-14 ENCOUNTER — Ambulatory Visit (INDEPENDENT_AMBULATORY_CARE_PROVIDER_SITE_OTHER)

## 2024-08-14 ENCOUNTER — Ambulatory Visit: Payer: Self-pay | Admitting: Internal Medicine

## 2024-08-14 ENCOUNTER — Other Ambulatory Visit: Payer: Self-pay

## 2024-08-14 DIAGNOSIS — I10 Essential (primary) hypertension: Secondary | ICD-10-CM

## 2024-08-14 DIAGNOSIS — I701 Atherosclerosis of renal artery: Secondary | ICD-10-CM

## 2024-08-14 NOTE — Telephone Encounter (Signed)
 completed

## 2024-08-17 ENCOUNTER — Telehealth: Payer: Self-pay | Admitting: Internal Medicine

## 2024-08-17 MED ORDER — METOPROLOL SUCCINATE ER 25 MG PO TB24: 37.5000 mg | ORAL_TABLET | Freq: Two times a day (BID) | ORAL | 3 refills | Status: AC

## 2024-08-17 NOTE — Telephone Encounter (Signed)
 Pt calling back to ask if she is able to be re prescribed Metoprolol  in the place of Coreg? Pt would also like to know if ahe is able to go to [place] office to have help applying Zio Monitor. Please advise

## 2024-08-17 NOTE — Telephone Encounter (Signed)
 Patient wants to get assistance placing her heart monitor.

## 2024-08-17 NOTE — Telephone Encounter (Signed)
 Called patient advised her that if she can come before clinic starts between 8-8:30 or at 4:00 someone can place her monitor on for her at the [place] office. Patient stated she will come on tomorrow. Verbalized understanding

## 2024-08-17 NOTE — Telephone Encounter (Signed)
 Pt notified of the switch back to Toprol  XL 37.5 BID. She states she was called today and will go by the [facility] office tomorrow to have monitor placed.

## 2024-08-17 NOTE — Telephone Encounter (Signed)
 Pt c/o medication issue:  1. Name of Medication: carvedilol (COREG) 3.125 MG tablet   2. How are you currently taking this medication (dosage and times per day)? As written   3. Are you having a reaction (difficulty breathing--STAT)? No  4. What is your medication issue? This medication is causing the pt to be extremely dizzy.

## 2024-08-18 DIAGNOSIS — G459 Transient cerebral ischemic attack, unspecified: Secondary | ICD-10-CM | POA: Diagnosis not present

## 2024-08-22 ENCOUNTER — Ambulatory Visit: Attending: Internal Medicine

## 2024-08-22 ENCOUNTER — Telehealth: Payer: Self-pay | Admitting: Internal Medicine

## 2024-08-22 DIAGNOSIS — B351 Tinea unguium: Secondary | ICD-10-CM | POA: Diagnosis not present

## 2024-08-22 DIAGNOSIS — G459 Transient cerebral ischemic attack, unspecified: Secondary | ICD-10-CM

## 2024-08-22 DIAGNOSIS — M79674 Pain in right toe(s): Secondary | ICD-10-CM | POA: Diagnosis not present

## 2024-08-22 NOTE — Telephone Encounter (Signed)
 Spoke to patient who is requesting to discontinue monitor. Pt stated that monitor is stressing her out d/t connectivity issues. Pt stated that monitor is not making matters better, it's worse.   Please advise.

## 2024-08-22 NOTE — Telephone Encounter (Signed)
 Patient stated she is having a stressful time doing the heart monitor test and wants a call back to discuss next steps.

## 2024-08-22 NOTE — Telephone Encounter (Signed)
 Called patient and advised to mail monitor back. Pt verbalized understanding.

## 2024-08-25 DIAGNOSIS — I10 Essential (primary) hypertension: Secondary | ICD-10-CM | POA: Diagnosis not present

## 2024-08-25 DIAGNOSIS — Z111 Encounter for screening for respiratory tuberculosis: Secondary | ICD-10-CM | POA: Diagnosis not present

## 2024-08-25 DIAGNOSIS — K5909 Other constipation: Secondary | ICD-10-CM | POA: Diagnosis not present

## 2024-08-25 DIAGNOSIS — Z8673 Personal history of transient ischemic attack (TIA), and cerebral infarction without residual deficits: Secondary | ICD-10-CM | POA: Diagnosis not present

## 2024-08-25 DIAGNOSIS — R634 Abnormal weight loss: Secondary | ICD-10-CM | POA: Diagnosis not present

## 2024-08-25 DIAGNOSIS — N1832 Chronic kidney disease, stage 3b: Secondary | ICD-10-CM | POA: Diagnosis not present

## 2024-09-04 DIAGNOSIS — R319 Hematuria, unspecified: Secondary | ICD-10-CM | POA: Diagnosis not present

## 2024-09-04 DIAGNOSIS — R634 Abnormal weight loss: Secondary | ICD-10-CM | POA: Diagnosis not present

## 2024-09-04 DIAGNOSIS — I10 Essential (primary) hypertension: Secondary | ICD-10-CM | POA: Diagnosis not present

## 2024-09-05 ENCOUNTER — Telehealth (INDEPENDENT_AMBULATORY_CARE_PROVIDER_SITE_OTHER): Payer: Self-pay

## 2024-09-05 NOTE — Telephone Encounter (Signed)
    09/05/24  Sandra Henry 06-17-1946  What type of surgery is being performed? EGD  When is surgery scheduled? TBD  What type of clearance is required (medical or pharmacy to hold medication or both? medical  Are there any medications that need to be held prior to surgery and how long? no  Name of physician performing surgery?  Dr. Cinderella Rouse [facility] at [facility] Phone: [phone] Fax: [phone]  Anethesia type (none, local, MAC, general)? MAC

## 2024-09-05 NOTE — Telephone Encounter (Signed)
    09/05/24  Asberry RAMAN Warchol 06-17-1946  What type of surgery is being performed? EGD  When is surgery scheduled? TBD  What type of clearance is required (medical or pharmacy to hold medication or both? medical  Are there any medications that need to be held prior to surgery and how long? no  Name of physician performing surgery?  Dr. Cinderella Rouse [facility] at [facility] Phone: [phone] Fax: [phone]  Anethesia type (none, local, MAC, general)? MAC

## 2024-09-05 NOTE — Telephone Encounter (Signed)
   Patient Name: Sandra Henry  DOB: October 08, 1946 MRN: [id number]  Primary Cardiologist: Diannah SHAUNNA Maywood, MD  Chart reviewed as part of pre-operative protocol coverage.  Patient was last seen in the office on 08/09/2024 by Dr. Mallipeddi and was cleared for upcoming EGD procedure.  .  I will route this recommendation as well as the notes from most recent office visit to the requesting party via Epic fax function and remove from pre-op pool.  Please call with questions.  Damien JAYSON Braver, NP 09/05/2024, 8:31 AM

## 2024-09-09 NOTE — Telephone Encounter (Signed)
 Please schedule as above, but please advice patient to continue aspirin 81mg 

## 2024-09-11 ENCOUNTER — Telehealth: Payer: Self-pay | Admitting: Neurology

## 2024-09-11 ENCOUNTER — Telehealth (INDEPENDENT_AMBULATORY_CARE_PROVIDER_SITE_OTHER): Payer: Self-pay

## 2024-09-11 NOTE — Telephone Encounter (Signed)
 No PA required with healthteam advantage for EGD.

## 2024-09-11 NOTE — Progress Notes (Unsigned)
 Patient name: Sandra Henry MRN: [id number] DOB: Dec 19, 1945 Sex: female  REASON FOR CONSULT: Renal artery stenosis  HPI: Sandra Henry is a 78 y.o. female, with history of hypertension that presents for evaluation of renal artery stenosis.  In reviewing the notes she initially had a CTA neck at [facility] on 05/08/2024 showing left internal carotid artery and right external carotid artery beading.  Dr. Mallipeddi subsequently recommended renal artery duplex.  She had a renal artery duplex on 08/14/2024 showing 1 to 59% stenosis bilaterally with no evidence of FMD.  Past Medical History:  Diagnosis Date   Dental bridge present    upper   Dental crowns present    Dizziness and giddiness    Glaucoma    Hypercalcemia    Hypertension    under control with med., has been on med. x 4 yr.   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   index, middle and ring fingers    Past Surgical History:  Procedure Laterality Date   BREAST BIOPSY Left    CATARACT EXTRACTION W/ INTRAOCULAR LENS IMPLANT Bilateral    COLONOSCOPY N/A 01/30/2021   Procedure: COLONOSCOPY;  Surgeon: Golda Claudis PENNER, MD;  [facility];  Service: Endoscopy;  Laterality: N/A;  am   TRIGGER FINGER RELEASE Right 02/20/2014   Procedure: RELEASE A-1 PULLEY RIGHT INDEX,  MIDDLE AND RING FINGERS ;  Surgeon: Arley JONELLE Curia, MD;  Location: [facility];  Service: Orthopedics;  Laterality: Right;   TUBAL LIGATION     laparoscopic    Family History  Problem Relation Age of Onset   Stroke Mother    Migraines Mother    Stroke Father    Heart attack Brother    Stroke Sister     SOCIAL HISTORY: Social History   Socioeconomic History   Marital status: Married    Spouse name: Not on file   Number of children: Not on file   Years of education: Not on file   Highest education level: Not on file  Occupational History   Occupation:  medical receptionist    Employer: [employer]  Tobacco Use   Smoking status: Never   Smokeless tobacco: Never  Vaping Use   Vaping status: Never Used  Substance and Sexual Activity   Alcohol use: No   Drug use: No   Sexual activity: Not on file  Other Topics Concern   Not on file  Social History Narrative   Lives at home w/ her husband   Left-handed   Caffeine: occasional soft drink   Social Drivers of Corporate Investment Banker Strain: Low Risk  (08/07/2024)   Overall Financial Resource Strain (CARDIA)    Difficulty of Paying Living Expenses: Not hard at all  Food Insecurity: No Food Insecurity (08/07/2024)   Hunger Vital Sign    Worried About Running Out of Food in the Last Year: Never true    Ran Out of Food in the Last Year: Never true  Transportation Needs: No Transportation Needs (08/07/2024)   PRAPARE - Administrator, Civil Service (Medical): No    Lack of Transportation (Non-Medical): No  Physical Activity: Sufficiently Active (08/07/2024)   Exercise Vital Sign    Days of Exercise per Week: 7 days    Minutes of Exercise per Session: 30 min  Recent Concern: Physical Activity - Inactive (06/25/2024)   Received from  [facility]   Exercise Vital Sign    On average, how many days per week do you engage in moderate to strenuous exercise (like a brisk walk)?: 0 days    On average, how many minutes do you engage in exercise at this level?: 0 min  Stress: No Stress Concern Present (08/07/2024)   [employer] of Occupational Health - Occupational Stress Questionnaire    Feeling of Stress: Not at all  Social Connections: Socially Integrated (08/07/2024)   Social Connection and Isolation Panel    Frequency of Communication with Friends and Family: More than three times a week    Frequency of Social Gatherings with Friends and Family: More than three times a week    Attends Religious Services: More than 4 times per year    Active Member of [employer] or  Organizations: Yes    Attends Engineer, Structural: More than 4 times per year    Marital Status: Married  Recent Concern: Social Connections - Moderately Isolated (06/25/2024)   Received from [facility]   Social Connection and Isolation Panel    In a typical week, how many times do you talk on the phone with family, friends, or neighbors?: More than three times a week    How often do you get together with friends or relatives?: More than three times a week    How often do you attend church or religious services?: Never    Do you belong to any clubs or organizations such as church groups, unions, fraternal or athletic groups, or school groups?: No    How often do you attend meetings of the clubs or organizations you belong to?: Never    Are you married, widowed, divorced, separated, never married, or living with a partner?: Married  Intimate Partner Violence: Not At Risk (08/07/2024)   Humiliation, Afraid, Rape, and Kick questionnaire    Fear of Current or Ex-Partner: No    Emotionally Abused: No    Physically Abused: No    Sexually Abused: No    Allergies  Allergen Reactions   Sulfa Antibiotics Rash, Dermatitis and Nausea Only    Other Reaction(s): Not available   Polyethylene Glycol 3350  Other (See Comments)    Numbness in legs   Codeine Nausea Only    Other Reaction(s): Not available   Penicillins Rash    AS A CHILD  Other Reaction(s): Not available    Current Outpatient Medications  Medication Sig Dispense Refill   amLODipine  (NORVASC ) 5 MG tablet Take 1 tablet (5 mg total) by mouth daily. (Patient taking differently: Take 5 mg by mouth in the morning and at bedtime.) 90 tablet 3   Ascorbic Acid (VITAMIN C) 1000 MG tablet Take 1,000 mg by mouth daily.     aspirin 81 MG tablet Take 81 mg by mouth daily.     Calcium -Magnesium-Vitamin D  600-40-500 MG-MG-UNIT TB24 Take 1 tablet by mouth 2 (two) times daily.     cholecalciferol (VITAMIN D ) 400 UNITS TABS tablet  Take 400 Units by mouth 2 (two) times daily.     Coenzyme Q10 (CO Q 10 PO) Take by mouth.     estradiol (ESTRACE) 0.1 MG/GM vaginal cream Place vaginally.     ezetimibe  (ZETIA ) 10 MG tablet Take 1 tablet (10 mg total) by mouth daily. 30 tablet 3   FIBER PO Take by mouth.     Garlic 10 MG CAPS Take 10 mg by mouth daily.     Glucosamine-Chondroitin 500-400 MG  CAPS Take 2 tablets by mouth daily.     LORazepam  (ATIVAN ) 1 MG tablet Take 1 tablet (1 mg total) by mouth 2 (two) times daily as needed for anxiety. 60 tablet 2   losartan  (COZAAR ) 25 MG tablet Take 1 tablet (25 mg total) by mouth daily. 90 tablet 3   lubiprostone  (AMITIZA ) 8 MCG capsule Take 1 capsule (8 mcg total) by mouth 2 (two) times daily with a meal. 180 capsule 1   Meclizine HCl 25 MG CHEW Chew 25 mg by mouth.     metoprolol  succinate (TOPROL  XL) 25 MG 24 hr tablet Take 1.5 tablets (37.5 mg total) by mouth 2 (two) times daily. 270 tablet 3   Multiple Vitamin (MULTIVITAMIN) tablet Take 1 tablet by mouth daily.     Netarsudil-Latanoprost (ROCKLATAN) 0.02-0.005 % SOLN Place 1 drop into both eyes at bedtime.     Omega-3 Fatty Acids (FISH OIL) 1000 MG CAPS Take 1,000 mg by mouth daily.     ondansetron  (ZOFRAN ) 4 MG tablet Take 1 tablet (4 mg total) by mouth 2 (two) times daily. 20 tablet 0   pantoprazole  (PROTONIX ) 40 MG tablet Take 1 tablet (40 mg total) by mouth daily. 60 tablet 3   rosuvastatin  (CRESTOR ) 20 MG tablet Take 1 tablet (20 mg total) by mouth daily. 90 tablet 1   SENNA PO Take 2 tablets by mouth.     timolol  (TIMOPTIC ) 0.5 % ophthalmic solution Place 1 drop into both eyes every morning. 10 mL 0   No current facility-administered medications for this visit.    REVIEW OF SYSTEMS:  [X]  denotes positive finding, [ ]  denotes negative finding Cardiac  Comments:  Chest pain or chest pressure: ***   Shortness of breath upon exertion:    Short of breath when lying flat:    Irregular heart rhythm:        Vascular    Pain  in calf, thigh, or hip brought on by ambulation:    Pain in feet at night that wakes you up from your sleep:     Blood clot in your veins:    Leg swelling:         Pulmonary    Oxygen at home:    Productive cough:     Wheezing:         Neurologic    Sudden weakness in arms or legs:     Sudden numbness in arms or legs:     Sudden onset of difficulty speaking or slurred speech:    Temporary loss of vision in one eye:     Problems with dizziness:         Gastrointestinal    Blood in stool:     Vomited blood:         Genitourinary    Burning when urinating:     Blood in urine:        Psychiatric    Major depression:         Hematologic    Bleeding problems:    Problems with blood clotting too easily:        Skin    Rashes or ulcers:        Constitutional    Fever or chills:      PHYSICAL EXAM: There were no vitals filed for this visit.  GENERAL: The patient is a well-nourished female, in no acute distress. The vital signs are documented above. CARDIAC: There is a regular rate and rhythm.  VASCULAR: *** PULMONARY:  There is good air exchange bilaterally without wheezing or rales. ABDOMEN: Soft and non-tender with normal pitched bowel sounds.  MUSCULOSKELETAL: There are no major deformities or cyanosis. NEUROLOGIC: No focal weakness or paresthesias are detected. SKIN: There are no ulcers or rashes noted. PSYCHIATRIC: The patient has a normal affect.  DATA:   ABDOMINAL VISCERAL  Patient Name:  MAY MANRIQUE  Date of Exam:   08/14/2024 Medical Rec #: [id number]          Accession #:    [id number] Date of Birth: Dec 01, 1945         Patient Gender: F Patient Age:   67 years Exam Location:  [facility] Procedure:      VAS US  RENAL ARTERY DUPLEX Referring Phys: [id number] VISHNU P MALLIPEDDI   --------------------------------------------------------------------------- -----  Indications: Patient has hypertension with known distal ICA fibromuscular               dysplasia (FMD) seen on CTA at [facility]. Patient denies any back/flank              pains.  High Risk Factors: Hypertension, no history of smoking.  Other Factors: History of CKD stage 3b                  History of TIA.  Limitations: Air/bowel gas. Comparison Study: None  Performing Technologist: Alecia Mackin RVT, RDCS (AE), RDMS    Examination Guidelines: A complete evaluation includes B-mode imaging, spectral Doppler, color Doppler, and power Doppler as needed of all accessible portions of each vessel. Bilateral testing is considered an integral part of a complete examination. Limited examinations for reoccurring indications may be performed as noted.    Duplex Findings: +--------------------+--------+--------+------+--------+ Mesenteric          PSV cm/sEDV cm/sPlaqueComments +--------------------+--------+--------+------+--------+ Aorta Prox            113                          +--------------------+--------+--------+------+--------+ Aorta Distal          122                          +--------------------+--------+--------+------+--------+ Celiac Artery Origin  126      18                  +--------------------+--------+--------+------+--------+ SMA Proximal          256      33                  +--------------------+--------+--------+------+--------+          +------------------+--------+--------+-------+ Right Renal ArteryPSV cm/sEDV cm/sComment +------------------+--------+--------+-------+ Origin              126      24           +------------------+--------+--------+-------+ Proximal            170      43           +------------------+--------+--------+-------+ Mid                  64      13           +------------------+--------+--------+-------+ Distal               90      20           +------------------+--------+--------+-------+  +-----------------+--------+--------+-------+ Left Renal  ArteryPSV cm/sEDV cm/sComment +-----------------+--------+--------+-------+ Origin             157      20           +-----------------+--------+--------+-------+ Proximal           157      16           +-----------------+--------+--------+-------+ Mid                101      28           +-----------------+--------+--------+-------+ Distal              72      15           +-----------------+--------+--------+-------+  +------------+--------+--------+----+-----------+--------+--------+----+ Right KidneyPSV cm/sEDV cm/sRI  Left KidneyPSV cm/sEDV cm/sRI   +------------+--------+--------+----+-----------+--------+--------+----+ Upper Pole  29      7       0.77Upper Pole 34      9       0.74 +------------+--------+--------+----+-----------+--------+--------+----+ Mid         22      6       0.        26      7       0.73 +------------+--------+--------+----+-----------+--------+--------+----+ Lower Pole  23      7       0.71Lower Pole 27      7       0.75 +------------+--------+--------+----+-----------+--------+--------+----+ Hilar       41      8       0.81Hilar      32      7       0.78 +------------+--------+--------+----+-----------+--------+--------+----+  +------------------+-------+------------------+--------+ Right Kidney             Left Kidney                +------------------+-------+------------------+--------+ RAR                      RAR                        +------------------+-------+------------------+--------+ RAR (manual)      1.5    RAR (manual)      1.4      +------------------+-------+------------------+--------+ Cortex                   Cortex                     +------------------+-------+------------------+--------+ Cortex thickness  8.00 mmCorex thickness   12.00 mm +------------------+-------+------------------+--------+ Kidney length (cm)8.7    Kidney  length (cm)9.3      +------------------+-------+------------------+--------+    Summary: Largest Aortic Diameter: 1.5 cm   Renal:   Right: Normal size right kidney. Abnormal right Resistive Index.        Normal cortical thickness of right kidney. 1-59% stenosis of        the right renal artery. RRV flow present. Left:  Normal size of left kidney. Abnormal left Resisitve Index.        Normal cortical thickness of the left kidney. 1-59% stenosis        of the left renal artery. LRV flow present.          No evidence of fibromuscular dysplasia (FMD) in either renal        artery. Mesenteric: Normal Celiac artery and Superior Mesenteric artery findings.  Severe atherosclerosis noted throughout aorta.   *See table(s) above for measurements and observations.   Diagnosing physician: Maude Emmer MD    Assessment/Plan:  78 y.o. female, with history of hypertension that presents for evaluation of renal artery stenosis.  In reviewing the notes she initially had a CTA neck at [facility] on 05/08/2024 showing left internal carotid artery and right external carotid artery beading.  Dr. Mallipeddi subsequently recommended renal artery duplex.  She had a renal artery duplex on 08/14/2024 showing 1 to 59% stenosis bilaterally with no evidence of FMD.   Lonni DOROTHA Gaskins, MD [facility]: [phone]

## 2024-09-11 NOTE — Telephone Encounter (Signed)
 Spoke with patient, scheduled EGD for 10/06/2024 at 1:00pm. Instructions mailed to patient as requested.

## 2024-09-11 NOTE — Telephone Encounter (Signed)
 Patient called to check if has an appointment scheduled. Patient did not have an appointment scheduled.

## 2024-09-12 ENCOUNTER — Encounter: Payer: Self-pay | Admitting: Vascular Surgery

## 2024-09-12 ENCOUNTER — Ambulatory Visit: Admitting: Vascular Surgery

## 2024-09-12 VITALS — BP 148/70 | HR 69 | Resp 18 | Ht 64.0 in | Wt 123.0 lb

## 2024-09-12 DIAGNOSIS — I6523 Occlusion and stenosis of bilateral carotid arteries: Secondary | ICD-10-CM | POA: Diagnosis not present

## 2024-09-12 DIAGNOSIS — I701 Atherosclerosis of renal artery: Secondary | ICD-10-CM | POA: Insufficient documentation

## 2024-09-12 DIAGNOSIS — I773 Arterial fibromuscular dysplasia: Secondary | ICD-10-CM | POA: Diagnosis not present

## 2024-09-12 DIAGNOSIS — I779 Disorder of arteries and arterioles, unspecified: Secondary | ICD-10-CM | POA: Insufficient documentation

## 2024-09-18 ENCOUNTER — Ambulatory Visit: Admitting: Neurology

## 2024-09-28 ENCOUNTER — Ambulatory Visit: Admitting: Internal Medicine

## 2024-10-02 ENCOUNTER — Encounter (HOSPITAL_COMMUNITY)
Admission: RE | Admit: 2024-10-02 | Discharge: 2024-10-02 | Disposition: A | Discharge status: Home / Self Care | Source: Ambulatory Visit | Attending: Gastroenterology | Admitting: Gastroenterology

## 2024-10-03 ENCOUNTER — Encounter (HOSPITAL_COMMUNITY): Payer: Self-pay

## 2024-10-03 ENCOUNTER — Other Ambulatory Visit: Payer: Self-pay

## 2024-10-06 ENCOUNTER — Encounter (HOSPITAL_COMMUNITY)
Admission: RE | Disposition: A | Discharge status: Home / Self Care | Payer: Self-pay | Source: Home / Self Care | Attending: Gastroenterology

## 2024-10-06 ENCOUNTER — Ambulatory Visit (HOSPITAL_COMMUNITY): Payer: Self-pay | Admitting: Anesthesiology

## 2024-10-06 ENCOUNTER — Ambulatory Visit (HOSPITAL_COMMUNITY)
Admission: RE | Admit: 2024-10-06 | Discharge: 2024-10-06 | Disposition: A | Discharge status: Home / Self Care | Attending: Gastroenterology | Admitting: Gastroenterology

## 2024-10-06 ENCOUNTER — Encounter (HOSPITAL_COMMUNITY): Payer: Self-pay | Admitting: Gastroenterology

## 2024-10-06 ENCOUNTER — Other Ambulatory Visit: Payer: Self-pay

## 2024-10-06 DIAGNOSIS — K2970 Gastritis, unspecified, without bleeding: Secondary | ICD-10-CM | POA: Diagnosis present

## 2024-10-06 DIAGNOSIS — K3189 Other diseases of stomach and duodenum: Secondary | ICD-10-CM | POA: Insufficient documentation

## 2024-10-06 DIAGNOSIS — K222 Esophageal obstruction: Secondary | ICD-10-CM | POA: Insufficient documentation

## 2024-10-06 DIAGNOSIS — N1832 Chronic kidney disease, stage 3b: Secondary | ICD-10-CM

## 2024-10-06 DIAGNOSIS — F411 Generalized anxiety disorder: Secondary | ICD-10-CM | POA: Diagnosis not present

## 2024-10-06 DIAGNOSIS — K449 Diaphragmatic hernia without obstruction or gangrene: Secondary | ICD-10-CM | POA: Insufficient documentation

## 2024-10-06 DIAGNOSIS — I129 Hypertensive chronic kidney disease with stage 1 through stage 4 chronic kidney disease, or unspecified chronic kidney disease: Secondary | ICD-10-CM

## 2024-10-06 DIAGNOSIS — K2950 Unspecified chronic gastritis without bleeding: Secondary | ICD-10-CM | POA: Insufficient documentation

## 2024-10-06 DIAGNOSIS — I10 Essential (primary) hypertension: Secondary | ICD-10-CM | POA: Insufficient documentation

## 2024-10-06 DIAGNOSIS — R1310 Dysphagia, unspecified: Secondary | ICD-10-CM | POA: Insufficient documentation

## 2024-10-06 HISTORY — PX: ESOPHAGEAL DILATION: SHX303

## 2024-10-06 HISTORY — PX: ESOPHAGOGASTRODUODENOSCOPY: SHX5428

## 2024-10-06 SURGERY — EGD (ESOPHAGOGASTRODUODENOSCOPY)
Anesthesia: Monitor Anesthesia Care

## 2024-10-06 MED ORDER — LACTATED RINGERS IV SOLN: INTRAVENOUS | Status: DC | PRN

## 2024-10-06 MED ORDER — PROPOFOL 10 MG/ML IV BOLUS
INTRAVENOUS | Status: DC | PRN
  Administered 2024-10-06: 100 mg via INTRAVENOUS
  Administered 2024-10-06: 125 ug/kg/min via INTRAVENOUS

## 2024-10-06 MED ORDER — LIDOCAINE HCL (CARDIAC) PF 100 MG/5ML IV SOSY
PREFILLED_SYRINGE | INTRAVENOUS | Status: DC | PRN
  Administered 2024-10-06: 50 mg via INTRAVENOUS

## 2024-10-06 NOTE — Transfer of Care (Signed)
 Immediate Anesthesia Transfer of Care Note  Patient: Sandra Henry  Procedure(s) Performed: EGD (ESOPHAGOGASTRODUODENOSCOPY) DILATION, ESOPHAGUS  Patient Location: Short Stay  Anesthesia Type:MAC  Level of Consciousness: drowsy, patient cooperative, and responds to stimulation  Airway & Oxygen Therapy: Patient Spontanous Breathing  Post-op Assessment: Report given to RN and Post -op Vital signs reviewed and stable  Post vital signs: Reviewed and stable  Last Vitals:  Vitals Value Taken Time  BP 107/63   Temp 97.9   Pulse 67   Resp 16   SpO2 98     Last Pain:  Vitals:   10/06/24 1151  TempSrc:   PainSc: 0-No pain      Patients Stated Pain Goal: 8 (10/06/24 1134)  Complications: No notable events documented.

## 2024-10-06 NOTE — Discharge Instructions (Signed)
" °  Discharge instructions Please read the instructions outlined below and refer to this sheet in the next few weeks. These discharge instructions provide you with general information on caring for yourself after you leave the hospital. Your doctor may also give you specific instructions. While your treatment has been planned according to the most current medical practices available, unavoidable complications occasionally occur. If you have any problems or questions after discharge, please call your doctor. ACTIVITY You may resume your regular activity but move at a slower pace for the next 24 hours.  Take frequent rest periods for the next 24 hours.  Walking will help expel (get rid of) the air and reduce the bloated feeling in your abdomen.  No driving for 24 hours (because of the anesthesia (medicine) used during the test).  You may shower.  Do not sign any important legal documents or operate any machinery for 24 hours (because of the anesthesia used during the test).  NUTRITION Drink plenty of fluids.  You may resume your normal diet.  Begin with a light meal and progress to your normal diet.  Avoid alcoholic beverages for 24 hours or as instructed by your caregiver.  MEDICATIONS You may resume your normal medications unless your caregiver tells you otherwise.  WHAT YOU CAN EXPECT TODAY You may experience abdominal discomfort such as a feeling of fullness or gas pains.  FOLLOW-UP Your doctor will discuss the results of your test with you.  SEEK IMMEDIATE MEDICAL ATTENTION IF ANY OF THE FOLLOWING OCCUR: Excessive nausea (feeling sick to your stomach) and/or vomiting.  Severe abdominal pain and distention (swelling).  Trouble swallowing.  Temperature over 101 F (37.8 C).  Rectal bleeding or vomiting of blood.     I recommend taking Protonix  40mg  , 30 min before breakfast    I hope you have a great rest of your week!   Jasn Xia Faizan Gearldean Lomanto , M.D.. Gastroenterology and  Hepatology [facility]    "

## 2024-10-06 NOTE — Anesthesia Preprocedure Evaluation (Signed)
 Anesthesia Evaluation  Patient identified by MRN, date of birth, ID band Patient awake    Reviewed: Allergy & Precautions, H&P , NPO status , Patient's Chart, lab work & pertinent test results, reviewed documented beta blocker date and time   Airway Mallampati: II  TM Distance: >3 FB Neck ROM: full    Dental no notable dental hx.    Pulmonary neg pulmonary ROS   Pulmonary exam normal breath sounds clear to auscultation       Cardiovascular Exercise Tolerance: Good hypertension, negative cardio ROS  Rhythm:regular Rate:Normal     Neuro/Psych negative neurological ROS  negative psych ROS   GI/Hepatic negative GI ROS, Neg liver ROS,,,  Endo/Other  negative endocrine ROS    Renal/GU negative Renal ROS  negative genitourinary   Musculoskeletal   Abdominal   Peds  Hematology negative hematology ROS (+)   Anesthesia Other Findings   Reproductive/Obstetrics negative OB ROS                              Anesthesia Physical Anesthesia Plan  ASA: 2  Anesthesia Plan: MAC   Post-op Pain Management:    Induction:   PONV Risk Score and Plan: Propofol  infusion  Airway Management Planned:   Additional Equipment:   Intra-op Plan:   Post-operative Plan:   Informed Consent: I have reviewed the patients History and Physical, chart, labs and discussed the procedure including the risks, benefits and alternatives for the proposed anesthesia with the patient or authorized representative who has indicated his/her understanding and acceptance.     Dental Advisory Given  Plan Discussed with: CRNA  Anesthesia Plan Comments:         Anesthesia Quick Evaluation

## 2024-10-06 NOTE — Op Note (Signed)
 [facility] Patient Name: Sandra Henry Procedure Date: 10/06/2024 11:43 AM MRN: [id number] Date of Birth: 07-05-1946 Attending MD: Deatrice Dine , MD, [id number] CSN: [id number] Age: 78 Admit Type: Outpatient Procedure:                Upper GI endoscopy Indications:              Epigastric abdominal pain, Dysphagia Providers:                Deatrice Dine, MD, Devere Lodge, Bascom Blush Referring MD:              Medicines:                Monitored Anesthesia Care Complications:            No immediate complications. Estimated Blood Loss:     Estimated blood loss was minimal. Procedure:                Pre-Anesthesia Assessment:                           - Prior to the procedure, a History and Physical                            was performed, and patient medications and                            allergies were reviewed. The patient's tolerance of                            previous anesthesia was also reviewed. The risks                            and benefits of the procedure and the sedation                            options and risks were discussed with the patient.                            All questions were answered, and informed consent                            was obtained. Prior Anticoagulants: The patient has                            taken no anticoagulant or antiplatelet agents                            except for aspirin. ASA Grade Assessment: II - A                            patient with mild systemic disease. After reviewing                            the risks and benefits, the patient was deemed in  satisfactory condition to undergo the procedure.                           After obtaining informed consent, the endoscope was                            passed under direct vision. Throughout the                            procedure, the patient's blood pressure, pulse, and                            oxygen saturations were  monitored continuously. The                            [id number] ([id number])Leezm was introduced through the                            mouth, and advanced to the second part of duodenum.                            The upper GI endoscopy was accomplished without                            difficulty. The patient tolerated the procedure                            well. Scope In: 11:55:40 AM Scope Out: 12:06:26 PM Total Procedure Duration: 0 hours 10 minutes 46 seconds  Findings:      A low-grade of narrowing Schatzki ring was found at the gastroesophageal       junction. A TTS dilator was passed through the scope. Dilation with a       15-16.5-18 mm balloon dilator was performed to 18 mm. The dilation site       was examined following endoscope reinsertion and showed mild mucosal       disruption.      A 4 cm hiatal hernia was present.      Mildly erythematous mucosa without bleeding was found in the gastric       antrum. Biopsies were taken with a cold forceps for histology.      The duodenal bulb and second portion of the duodenum were normal. Impression:               - Low-grade of narrowing Schatzki ring. Dilated.                            The ring was also distrupted with biopsy forceps                           - 4 cm hiatal hernia.                           - Erythematous mucosa in the antrum. Biopsied.                           -  Normal duodenal bulb and second portion of the                            duodenum. Moderate Sedation:      Per Anesthesia Care Recommendation:           - Patient has a contact number available for                            emergencies. The signs and symptoms of potential                            delayed complications were discussed with the                            patient. Return to normal activities tomorrow.                            Written discharge instructions were provided to the                            patient.                            - Resume previous diet.                           - Continue present medications.                           - Await pathology results.                           - Repeat upper endoscopy PRN.                           - Return to GI clinic as previously scheduled.                           -PPI daily Procedure Code(s):        --- Professional ---                           [phone], Esophagogastroduodenoscopy, flexible,                            transoral; with transendoscopic balloon dilation of                            esophagus (less than 30 mm diameter)                           43239, 59, Esophagogastroduodenoscopy, flexible,                            transoral; with biopsy, single or multiple Diagnosis Code(s):        --- Professional ---  K22.2, Esophageal obstruction                           K44.9, Diaphragmatic hernia without obstruction or                            gangrene                           K31.89, Other diseases of stomach and duodenum                           R10.13, Epigastric pain                           R13.10, Dysphagia, unspecified CPT copyright 2022 American Medical Association. All rights reserved. The codes documented in this report are preliminary and upon coder review may  be revised to meet current compliance requirements. Deatrice Dine, MD Deatrice Dine, MD 10/06/2024 12:12:39 PM This report has been signed electronically. Number of Addenda: 0

## 2024-10-06 NOTE — H&P (Signed)
 Primary Care Physician:  Marlee Lynwood NOVAK, MD Primary Gastroenterologist:  Dr. Cinderella  Pre-Procedure History & Physical: HPI:  Sandra Henry is a 78 y.o. female with HTN, HLD, glaucoma who presents for evaluation of Nausea, decreased appetite and solid food dysphagia  she has been nauseous with significantly decreased appetite.  Patient having a bowel movement every 2 to 3 days which would alternate between hard stool pebble-like and liquid stool.  Patient also reports solid food dysphagia as if food getting stuck in middle of her chest.    Currently patient is only taking senna and have taken Linzess  only intermittently    hemoglobin 13 creatinine 1.56 normal liver enzymes TSH within normal limits Last ZHI:wnwz Last Colonoscopy:2022   - Diverticulosis in the sigmoid colon. - External hemorrhoids.   No further colonoscopy recommended   FHx: neg for any gastrointestinal/liver disease, no malignancies Social: neg smoking, alcohol or illicit drug use Surgical: no abdominal surgeries   Stress test negative 06/2024    Past Medical History:  Diagnosis Date   Dental bridge present    upper   Dental crowns present    Dizziness and giddiness    Glaucoma    Hypercalcemia    Hypertension    under control with med., has been on med. x 4 yr.   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   index, middle and ring fingers    Past Surgical History:  Procedure Laterality Date   BREAST BIOPSY Left    CATARACT EXTRACTION W/ INTRAOCULAR LENS IMPLANT Bilateral    COLONOSCOPY N/A 01/30/2021   Procedure: COLONOSCOPY;  Surgeon: Golda Claudis PENNER, MD;  [facility]: [facility];  Service: Endoscopy;  Laterality: N/A;  am   TRIGGER FINGER RELEASE Right 02/20/2014   Procedure: RELEASE A-1 PULLEY RIGHT INDEX,  MIDDLE AND RING FINGERS ;  Surgeon: Arley JONELLE Curia, MD;  Location: [facility];  Service: Orthopedics;   Laterality: Right;   TUBAL LIGATION     laparoscopic    Prior to Admission medications  Medication Sig Start Date End Date Taking? Authorizing Provider  amLODipine  (NORVASC ) 5 MG tablet Take 1 tablet (5 mg total) by mouth daily. 05/22/24  Yes Tobie Suzzane POUR, MD  Ascorbic Acid (VITAMIN C) 1000 MG tablet Take 1,000 mg by mouth daily.   Yes [provider]  aspirin 81 MG tablet Take 81 mg by mouth daily.   Yes [provider]  Calcium -Magnesium-Vitamin D  600-40-500 MG-MG-UNIT TB24 Take 1 tablet by mouth 2 (two) times daily.   Yes [provider]  cholecalciferol (VITAMIN D ) 400 UNITS TABS tablet Take 400 Units by mouth 2 (two) times daily.   Yes [provider]  Coenzyme Q10 (CO Q 10 PO) Take by mouth.   Yes [provider]  estradiol (ESTRACE) 0.1 MG/GM vaginal cream Place vaginally.   Yes [provider]  ezetimibe  (ZETIA ) 10 MG tablet Take 1 tablet (10 mg total) by mouth daily. 08/10/24  Yes Sethi, Pramod S, MD  FIBER PO Take by mouth.   Yes [provider]  Glucosamine-Chondroitin 500-400 MG CAPS Take 2 tablets by mouth daily.   Yes [provider]  LORazepam  (ATIVAN ) 1 MG tablet Take 1 tablet (1 mg total) by mouth 2 (two) times daily as needed for anxiety. 08/07/24  Yes Tobie Suzzane POUR, MD  losartan  (COZAAR ) 25 MG tablet Take 1 tablet (25 mg total)  by mouth daily. 07/17/24  Yes Bevely Doffing, FNP  lubiprostone  (AMITIZA ) 8 MCG capsule Take 1 capsule (8 mcg total) by mouth 2 (two) times daily with a meal. 08/03/24 01/30/25 Yes Kanija Remmel, Deatrice FALCON, MD  Meclizine HCl 25 MG CHEW Chew 25 mg by mouth.   Yes [provider]  metoprolol  succinate (TOPROL  XL) 25 MG 24 hr tablet Take 1.5 tablets (37.5 mg total) by mouth 2 (two) times daily. 08/17/24  Yes Mallipeddi, Vishnu P, MD  Multiple Vitamin (MULTIVITAMIN) tablet Take 1 tablet by mouth daily.   Yes [provider]  Netarsudil-Latanoprost (ROCKLATAN) 0.02-0.005 %  SOLN Place 1 drop into both eyes at bedtime.   Yes [provider]  Omega-3 Fatty Acids (FISH OIL) 1000 MG CAPS Take 1,000 mg by mouth daily.   Yes [provider]  ondansetron  (ZOFRAN ) 4 MG tablet Take 1 tablet (4 mg total) by mouth 2 (two) times daily. 08/02/24  Yes Zebulin Siegel, Deatrice FALCON, MD  rosuvastatin  (CRESTOR ) 20 MG tablet Take 1 tablet (20 mg total) by mouth daily. 05/22/24  Yes Tobie Suzzane POUR, MD  SENNA PO Take 2 tablets by mouth.   Yes [provider]  timolol  (TIMOPTIC ) 0.5 % ophthalmic solution Place 1 drop into both eyes every morning. 10/29/23  Yes Melvenia Manus BRAVO, MD  Garlic 10 MG CAPS Take 10 mg by mouth daily.    [provider]  pantoprazole  (PROTONIX ) 40 MG tablet Take 1 tablet (40 mg total) by mouth daily. 08/02/24   Cinderella Deatrice FALCON, MD    Allergies as of 09/11/2024 - Review Complete 08/10/2024  Allergen Reaction Noted   Sulfa antibiotics Rash, Dermatitis, and Nausea Only 02/16/2014   Polyethylene glycol 3350  Other (See Comments) 07/31/2024   Codeine Nausea Only 02/16/2014   Penicillins Rash 02/16/2014    Family History  Problem Relation Age of Onset   Stroke Mother    Migraines Mother    Stroke Father    Heart attack Brother    Stroke Sister     Social History   Socioeconomic History   Marital status: Married    Spouse name: Not on file   Number of children: Not on file   Years of education: Not on file   Highest education level: Not on file  Occupational History   Occupation: medical receptionist    Employer: [employer]  Tobacco Use   Smoking status: Never   Smokeless tobacco: Never  Vaping Use   Vaping status: Never Used  Substance and Sexual Activity   Alcohol use: No   Drug use: No   Sexual activity: Not on file  Other Topics Concern   Not on file  Social History Narrative   Lives at home w/ her husband   Left-handed   Caffeine: occasional soft drink   Social Drivers of Health   Tobacco Use: Low Risk  (10/06/2024)   Patient History    Smoking Tobacco Use: Never    Smokeless Tobacco Use: Never    Passive Exposure: Not on file  Financial Resource Strain: Low Risk (08/07/2024)   Overall Financial Resource Strain (CARDIA)    Difficulty of Paying Living Expenses: Not hard at all  Food Insecurity: No Food Insecurity (08/07/2024)   Epic    Worried About Radiation Protection Practitioner of Food in the Last Year: Never true    Ran Out of Food in the Last Year: Never true  Transportation Needs: No Transportation Needs (08/07/2024)   Epic    Lack of Transportation (  Medical): No    Lack of Transportation (Non-Medical): No  Physical Activity: Sufficiently Active (08/07/2024)   Exercise Vital Sign    Days of Exercise per Week: 7 days    Minutes of Exercise per Session: 30 min  Recent Concern: Physical Activity - Inactive (06/25/2024)   Received from [facility]   Exercise Vital Sign    On average, how many days per week do you engage in moderate to strenuous exercise (like a brisk walk)?: 0 days    On average, how many minutes do you engage in exercise at this level?: 0 min  Stress: No Stress Concern Present (08/07/2024)   [employer] of Occupational Health - Occupational Stress Questionnaire    Feeling of Stress: Not at all  Social Connections: Socially Integrated (08/07/2024)   Social Connection and Isolation Panel    Frequency of Communication with Friends and Family: More than three times a week    Frequency of Social Gatherings with Friends and Family: More than three times a week    Attends Religious Services: More than 4 times per year    Active Member of [employer] or Organizations: Yes    Attends Engineer, Structural: More than 4 times per year    Marital Status: Married  Recent Concern: Social Connections - Moderately Isolated (06/25/2024)   Received from [facility]   Social Connection and Isolation Panel    In a typical week, how many times do you talk on the phone with family,  friends, or neighbors?: More than three times a week    How often do you get together with friends or relatives?: More than three times a week    How often do you attend church or religious services?: Never    Do you belong to any clubs or organizations such as church groups, unions, fraternal or athletic groups, or school groups?: No    How often do you attend meetings of the clubs or organizations you belong to?: Never    Are you married, widowed, divorced, separated, never married, or living with a partner?: Married  Intimate Partner Violence: Not At Risk (08/07/2024)   Epic    Fear of Current or Ex-Partner: No    Emotionally Abused: No    Physically Abused: No    Sexually Abused: No  Depression (PHQ2-9): Low Risk (08/07/2024)   Depression (PHQ2-9)    PHQ-2 Score: 0  Alcohol Screen: Low Risk (08/07/2024)   Alcohol Screen    Last Alcohol Screening Score (AUDIT): 0  Housing: Low Risk (08/07/2024)   Epic    Unable to Pay for Housing in the Last Year: No    Number of Times Moved in the Last Year: 0    Homeless in the Last Year: No  Utilities: Not At Risk (08/07/2024)   Epic    Threatened with loss of utilities: No  Health Literacy: Adequate Health Literacy (08/07/2024)   B1300 Health Literacy    Frequency of need for help with medical instructions: Never  Recent Concern: Health Literacy - Medium Risk (06/25/2024)   Received from [facility] Literacy    How often do you need to have someone help you when you read instructions, pamphlets, or other written material from your doctor or pharmacy?: Rarely    Review of Systems: See HPI, otherwise negative ROS  Physical Exam: Vital signs in last 24 hours:     General:   Alert,  Well-developed, well-nourished, pleasant and cooperative in  NAD Head:  Normocephalic and atraumatic. Eyes:  Sclera clear, no icterus.   Conjunctiva pink. Ears:  Normal auditory acuity. Nose:  No deformity, discharge,  or lesions. Msk:   Symmetrical without gross deformities. Normal posture. Extremities:  Without clubbing or edema. Neurologic:  Alert and  oriented x4;  grossly normal neurologically. Skin:  Intact without significant lesions or rashes. Psych:  Alert and cooperative. Normal mood and affect.  Impression/Plan:  Sandra Henry is a 78 y.o. female with HTN, HLD, glaucoma who presents for evaluation of Nausea, decreased appetite and solid food dysphagia  Proceed with Upper endoscopy with biopsies and dilation   The risks of the procedure including infection, bleed, or perforation as well as benefits, limitations, alternatives and imponderables have been reviewed with the patient. Questions have been answered. All parties agreeable.

## 2024-10-06 NOTE — Anesthesia Postprocedure Evaluation (Signed)
"   Anesthesia Post Note  Patient: Elvena Oyer Schicker  Procedure(s) Performed: EGD (ESOPHAGOGASTRODUODENOSCOPY) DILATION, ESOPHAGUS  Patient location during evaluation: Phase II Anesthesia Type: MAC Level of consciousness: awake Pain management: pain level controlled Vital Signs Assessment: post-procedure vital signs reviewed and stable Respiratory status: spontaneous breathing and respiratory function stable Cardiovascular status: blood pressure returned to baseline and stable Postop Assessment: no headache and no apparent nausea or vomiting Anesthetic complications: no Comments: Late entry   No notable events documented.   Last Vitals:  Vitals:   10/06/24 1134 10/06/24 1208  BP: (!) 143/60 (!) 107/42  Pulse: (!) 57 66  Resp: 15 16  Temp: 37.1 C 36.5 C  SpO2: 100% 98%    Last Pain:  Vitals:   10/06/24 1208  TempSrc: Oral  PainSc: 0-No pain                 Yvonna PARAS Tesa Meadors      "

## 2024-10-09 LAB — SURGICAL PATHOLOGY

## 2024-10-10 ENCOUNTER — Encounter (HOSPITAL_COMMUNITY): Payer: Self-pay | Admitting: Gastroenterology

## 2024-10-13 ENCOUNTER — Ambulatory Visit (INDEPENDENT_AMBULATORY_CARE_PROVIDER_SITE_OTHER): Payer: Self-pay | Admitting: Gastroenterology

## 2024-10-13 NOTE — Progress Notes (Signed)
 Patient result letter mailed procedure note and pathology result faxed to PCP

## 2024-10-19 ENCOUNTER — Telehealth (INDEPENDENT_AMBULATORY_CARE_PROVIDER_SITE_OTHER): Payer: Self-pay

## 2024-10-19 NOTE — Telephone Encounter (Signed)
 Patient called left a vm on nurse line 10/18/2024 at 4:42 pm, that the lubiprostone  cost too much would like other recommendation or a different medication called into her the pharmacy, [facility] in [place]. Patient says her symptoms are much better. Please advise.

## 2024-10-19 NOTE — Telephone Encounter (Signed)
 Pt called back and wants to know if there isn't a cheaper option if a tier exception form can be submitted to her insurance company requesting the medication be changed to a lower costing tier.

## 2024-10-20 MED ORDER — PRUCALOPRIDE SUCCINATE 2 MG PO TABS: 2.0000 mg | ORAL_TABLET | Freq: Every day | ORAL | 1 refills | Status: AC

## 2024-10-20 NOTE — Addendum Note (Signed)
 Addended by: CINDERELLA DEATRICE SMILES on: 10/20/2024 03:22 PM   Modules accepted: Orders

## 2024-10-20 NOTE — Telephone Encounter (Signed)
 I am sending in Prucalopride to see if patient can tolerate it . Otherwise she can take miralax  twice daily

## 2024-10-23 NOTE — Telephone Encounter (Signed)
 I spoke with the patient and she says the prucalopride is also too expensive. She says she is not really wanting to take Miralax  as the two separate times she took it she had paralysis of her body and had to go to the hosptial, which she was told she had, had a TIA. Please advise.

## 2024-10-23 NOTE — Telephone Encounter (Signed)
 Pt left voicemail stating she forgot to ask about doing a tier exception for Prucalopride. Contacted patient and advised her she would need to call her pharmacy plan to have them send over a form for us  to fill out. Pt states she did contacted her plan and they sent her over some alternatives. Pt is ok waiting until Dr.Ahmed is back in the office to get his recommendations.   Preferred Alternatives: Constulose oral solution 10g/67mL Enulose oral solution 10g/15 mL Generlacose oral solution 10g/15mL Lactulose oral solution 10g/15mL

## 2024-10-30 MED ORDER — LACTULOSE 10 GM/15ML PO SOLN: 20.0000 g | Freq: Two times a day (BID) | ORAL | 0 refills | Status: AC

## 2024-10-30 NOTE — Telephone Encounter (Signed)
 I would have preferred Linzess  or lubiprostone .  If she is unable to afford them I would recommend patient to continue taking MiraLAX  twice daily, but I do understand she would like to avoid as well.  Above preferred alternatives would cause cramping which is lactulose .  I can prescribe it to see if she will be able to tolerate it

## 2024-10-30 NOTE — Addendum Note (Signed)
 Addended by: CINDERELLA DEATRICE SMILES on: 10/30/2024 04:29 PM   Modules accepted: Orders

## 2024-10-31 NOTE — Telephone Encounter (Signed)
 Pt contacted and verbalized understanding.

## 2024-11-15 ENCOUNTER — Telehealth (INDEPENDENT_AMBULATORY_CARE_PROVIDER_SITE_OTHER): Payer: Self-pay | Admitting: Gastroenterology

## 2024-11-15 NOTE — Telephone Encounter (Signed)
 Pt called and wanted us  to relay a message to Dr.Ahmed. Patient states she appreciates provider working with her on getting her medication worked out. Pt states she is on Lactulose   Solution and it works better than Amitiza . Pt wanted provider to know that she is going regular and feels like a new person. Pt wanted to say she is very thankful.

## 2024-11-20 ENCOUNTER — Ambulatory Visit

## 2024-11-20 ENCOUNTER — Ambulatory Visit: Admitting: Internal Medicine

## 2024-12-11 ENCOUNTER — Ambulatory Visit: Admitting: Internal Medicine

## 2025-08-08 ENCOUNTER — Ambulatory Visit
